# Patient Record
Sex: Female | Born: 1960 | Race: White | Hispanic: No | Smoking: Former smoker
Health system: Southern US, Community
[De-identification: ages and names within clinical notes are randomized; demographics above are authoritative.]

## PROBLEM LIST (undated history)

## (undated) DIAGNOSIS — I10 Essential (primary) hypertension: Secondary | ICD-10-CM

## (undated) DIAGNOSIS — F0390 Unspecified dementia without behavioral disturbance: Secondary | ICD-10-CM

## (undated) DIAGNOSIS — U071 COVID-19: Secondary | ICD-10-CM

## (undated) DIAGNOSIS — K869 Disease of pancreas, unspecified: Secondary | ICD-10-CM

## (undated) DIAGNOSIS — K56609 Unspecified intestinal obstruction, unspecified as to partial versus complete obstruction: Secondary | ICD-10-CM

## (undated) DIAGNOSIS — M5126 Other intervertebral disc displacement, lumbar region: Secondary | ICD-10-CM

## (undated) DIAGNOSIS — K2210 Ulcer of esophagus without bleeding: Secondary | ICD-10-CM

## (undated) DIAGNOSIS — K922 Gastrointestinal hemorrhage, unspecified: Secondary | ICD-10-CM

## (undated) DIAGNOSIS — G8929 Other chronic pain: Secondary | ICD-10-CM

## (undated) DIAGNOSIS — M549 Dorsalgia, unspecified: Secondary | ICD-10-CM

## (undated) HISTORY — PX: ABDOMINAL SURGERY: SHX537

## (undated) HISTORY — PX: BOWEL RESECTION: SHX1257

## (undated) HISTORY — PX: BACK SURGERY: SHX140

---

## 1998-12-19 HISTORY — PX: LAPAROSCOPIC NISSEN FUNDOPLICATION: SHX1932

## 1999-03-11 ENCOUNTER — Ambulatory Visit (HOSPITAL_COMMUNITY): Admission: RE | Admit: 1999-03-11 | Discharge: 1999-03-11 | Payer: Self-pay | Admitting: *Deleted

## 1999-04-19 ENCOUNTER — Ambulatory Visit (HOSPITAL_COMMUNITY): Admission: RE | Admit: 1999-04-19 | Discharge: 1999-04-19 | Payer: Self-pay | Admitting: *Deleted

## 1999-04-26 ENCOUNTER — Ambulatory Visit (HOSPITAL_COMMUNITY): Admission: RE | Admit: 1999-04-26 | Discharge: 1999-04-26 | Payer: Self-pay | Admitting: *Deleted

## 1999-06-16 ENCOUNTER — Encounter: Payer: Self-pay | Admitting: Surgery

## 1999-06-18 ENCOUNTER — Inpatient Hospital Stay (HOSPITAL_COMMUNITY): Admission: RE | Admit: 1999-06-18 | Discharge: 1999-06-21 | Payer: Self-pay | Admitting: Surgery

## 2000-04-14 ENCOUNTER — Encounter: Admission: RE | Admit: 2000-04-14 | Discharge: 2000-04-14 | Payer: Self-pay | Admitting: Family Medicine

## 2000-04-14 ENCOUNTER — Encounter: Payer: Self-pay | Admitting: Family Medicine

## 2000-09-14 ENCOUNTER — Encounter: Payer: Self-pay | Admitting: Family Medicine

## 2000-09-14 ENCOUNTER — Encounter: Admission: RE | Admit: 2000-09-14 | Discharge: 2000-09-14 | Payer: Self-pay | Admitting: Family Medicine

## 2001-01-10 ENCOUNTER — Encounter: Payer: Self-pay | Admitting: Family Medicine

## 2001-01-10 ENCOUNTER — Encounter: Admission: RE | Admit: 2001-01-10 | Discharge: 2001-01-10 | Payer: Self-pay | Admitting: Family Medicine

## 2001-01-11 ENCOUNTER — Encounter: Payer: Self-pay | Admitting: Family Medicine

## 2001-01-11 ENCOUNTER — Encounter: Admission: RE | Admit: 2001-01-11 | Discharge: 2001-01-11 | Payer: Self-pay | Admitting: Family Medicine

## 2001-02-14 ENCOUNTER — Encounter: Payer: Self-pay | Admitting: *Deleted

## 2001-02-14 ENCOUNTER — Encounter: Admission: RE | Admit: 2001-02-14 | Discharge: 2001-02-14 | Payer: Self-pay | Admitting: *Deleted

## 2001-03-02 ENCOUNTER — Ambulatory Visit (HOSPITAL_COMMUNITY): Admission: RE | Admit: 2001-03-02 | Discharge: 2001-03-02 | Payer: Self-pay | Admitting: *Deleted

## 2001-03-02 ENCOUNTER — Encounter (INDEPENDENT_AMBULATORY_CARE_PROVIDER_SITE_OTHER): Payer: Self-pay | Admitting: *Deleted

## 2001-12-19 HISTORY — PX: VENTRAL HERNIA REPAIR: SHX424

## 2001-12-19 HISTORY — PX: ABDOMINAL HYSTERECTOMY: SHX81

## 2002-01-29 ENCOUNTER — Encounter: Payer: Self-pay | Admitting: Family Medicine

## 2002-01-29 ENCOUNTER — Encounter: Admission: RE | Admit: 2002-01-29 | Discharge: 2002-01-29 | Payer: Self-pay | Admitting: Family Medicine

## 2002-03-26 ENCOUNTER — Inpatient Hospital Stay (HOSPITAL_COMMUNITY): Admission: RE | Admit: 2002-03-26 | Discharge: 2002-03-29 | Payer: Self-pay | Admitting: General Surgery

## 2002-05-10 ENCOUNTER — Encounter: Payer: Self-pay | Admitting: General Surgery

## 2002-05-10 ENCOUNTER — Ambulatory Visit (HOSPITAL_COMMUNITY): Admission: RE | Admit: 2002-05-10 | Discharge: 2002-05-10 | Payer: Self-pay | Admitting: General Surgery

## 2002-05-13 ENCOUNTER — Ambulatory Visit (HOSPITAL_COMMUNITY): Admission: RE | Admit: 2002-05-13 | Discharge: 2002-05-13 | Payer: Self-pay | Admitting: General Surgery

## 2002-05-15 ENCOUNTER — Ambulatory Visit (HOSPITAL_COMMUNITY): Admission: RE | Admit: 2002-05-15 | Discharge: 2002-05-15 | Payer: Self-pay | Admitting: General Surgery

## 2002-05-15 ENCOUNTER — Encounter: Payer: Self-pay | Admitting: General Surgery

## 2002-06-26 ENCOUNTER — Ambulatory Visit (HOSPITAL_COMMUNITY): Admission: RE | Admit: 2002-06-26 | Discharge: 2002-06-26 | Payer: Self-pay | Admitting: General Surgery

## 2002-06-26 ENCOUNTER — Encounter: Payer: Self-pay | Admitting: General Surgery

## 2002-09-03 ENCOUNTER — Encounter: Payer: Self-pay | Admitting: Family Medicine

## 2002-09-03 ENCOUNTER — Encounter: Admission: RE | Admit: 2002-09-03 | Discharge: 2002-09-03 | Payer: Self-pay | Admitting: Family Medicine

## 2002-11-27 ENCOUNTER — Encounter: Admission: RE | Admit: 2002-11-27 | Discharge: 2003-01-01 | Payer: Self-pay | Admitting: Sports Medicine

## 2002-12-02 ENCOUNTER — Encounter: Admission: RE | Admit: 2002-12-02 | Discharge: 2002-12-02 | Payer: Self-pay | Admitting: Sports Medicine

## 2003-01-05 ENCOUNTER — Encounter: Payer: Self-pay | Admitting: Emergency Medicine

## 2003-01-05 ENCOUNTER — Emergency Department (HOSPITAL_COMMUNITY): Admission: EM | Admit: 2003-01-05 | Discharge: 2003-01-05 | Payer: Self-pay | Admitting: Emergency Medicine

## 2003-01-24 ENCOUNTER — Encounter: Payer: Self-pay | Admitting: Emergency Medicine

## 2003-01-24 ENCOUNTER — Emergency Department (HOSPITAL_COMMUNITY): Admission: EM | Admit: 2003-01-24 | Discharge: 2003-01-24 | Payer: Self-pay | Admitting: Emergency Medicine

## 2003-02-04 ENCOUNTER — Encounter: Admission: RE | Admit: 2003-02-04 | Discharge: 2003-02-04 | Payer: Self-pay | Admitting: Surgery

## 2003-02-04 ENCOUNTER — Encounter: Payer: Self-pay | Admitting: Surgery

## 2003-03-03 ENCOUNTER — Encounter: Admission: RE | Admit: 2003-03-03 | Discharge: 2003-03-03 | Payer: Self-pay | Admitting: Sports Medicine

## 2003-03-17 ENCOUNTER — Encounter: Admission: RE | Admit: 2003-03-17 | Discharge: 2003-03-17 | Payer: Self-pay | Admitting: Sports Medicine

## 2004-11-29 ENCOUNTER — Ambulatory Visit (HOSPITAL_COMMUNITY): Admission: RE | Admit: 2004-11-29 | Discharge: 2004-11-29 | Payer: Self-pay | Admitting: Obstetrics & Gynecology

## 2005-01-03 ENCOUNTER — Encounter: Admission: RE | Admit: 2005-01-03 | Discharge: 2005-01-26 | Payer: Self-pay | Admitting: Orthopedic Surgery

## 2005-03-02 ENCOUNTER — Ambulatory Visit (HOSPITAL_COMMUNITY): Admission: RE | Admit: 2005-03-02 | Discharge: 2005-03-02 | Payer: Self-pay | Admitting: General Surgery

## 2005-06-14 ENCOUNTER — Encounter: Admission: RE | Admit: 2005-06-14 | Discharge: 2005-09-12 | Payer: Self-pay | Admitting: Orthopedic Surgery

## 2006-09-18 ENCOUNTER — Emergency Department (HOSPITAL_COMMUNITY): Admission: EM | Admit: 2006-09-18 | Discharge: 2006-09-18 | Payer: Self-pay | Admitting: Emergency Medicine

## 2006-12-19 HISTORY — PX: LAPAROSCOPIC INCISIONAL / UMBILICAL / VENTRAL HERNIA REPAIR: SUR789

## 2007-05-04 ENCOUNTER — Ambulatory Visit (HOSPITAL_COMMUNITY): Admission: RE | Admit: 2007-05-04 | Discharge: 2007-05-05 | Payer: Self-pay | Admitting: General Surgery

## 2007-11-14 ENCOUNTER — Ambulatory Visit: Payer: Self-pay | Admitting: Vascular Surgery

## 2008-02-05 ENCOUNTER — Emergency Department (HOSPITAL_COMMUNITY): Admission: EM | Admit: 2008-02-05 | Discharge: 2008-02-05 | Payer: Self-pay | Admitting: Emergency Medicine

## 2008-03-10 ENCOUNTER — Encounter: Admission: RE | Admit: 2008-03-10 | Discharge: 2008-03-10 | Payer: Self-pay | Admitting: Neurosurgery

## 2009-02-02 ENCOUNTER — Encounter: Admission: RE | Admit: 2009-02-02 | Discharge: 2009-02-02 | Payer: Self-pay | Admitting: Neurosurgery

## 2009-02-16 ENCOUNTER — Inpatient Hospital Stay (HOSPITAL_COMMUNITY): Admission: RE | Admit: 2009-02-16 | Discharge: 2009-02-18 | Payer: Self-pay | Admitting: Neurosurgery

## 2009-02-26 ENCOUNTER — Encounter: Admission: RE | Admit: 2009-02-26 | Discharge: 2009-02-26 | Payer: Self-pay | Admitting: Neurosurgery

## 2009-03-09 ENCOUNTER — Emergency Department (HOSPITAL_COMMUNITY): Admission: EM | Admit: 2009-03-09 | Discharge: 2009-03-09 | Payer: Self-pay | Admitting: Emergency Medicine

## 2009-03-12 ENCOUNTER — Encounter: Admission: RE | Admit: 2009-03-12 | Discharge: 2009-03-12 | Payer: Self-pay | Admitting: Neurosurgery

## 2009-05-27 ENCOUNTER — Encounter: Admission: RE | Admit: 2009-05-27 | Discharge: 2009-08-25 | Payer: Self-pay | Admitting: Neurosurgery

## 2009-11-24 ENCOUNTER — Encounter
Admission: RE | Admit: 2009-11-24 | Discharge: 2009-12-10 | Payer: Self-pay | Admitting: Physical Medicine and Rehabilitation

## 2009-11-25 ENCOUNTER — Ambulatory Visit: Payer: Self-pay | Admitting: Physical Medicine and Rehabilitation

## 2010-02-15 ENCOUNTER — Encounter (INDEPENDENT_AMBULATORY_CARE_PROVIDER_SITE_OTHER): Payer: Self-pay | Admitting: *Deleted

## 2010-03-11 ENCOUNTER — Ambulatory Visit: Payer: Self-pay | Admitting: Gastroenterology

## 2010-04-28 ENCOUNTER — Emergency Department (HOSPITAL_COMMUNITY): Admission: EM | Admit: 2010-04-28 | Discharge: 2010-04-28 | Payer: Self-pay | Admitting: Emergency Medicine

## 2010-10-20 ENCOUNTER — Emergency Department (HOSPITAL_COMMUNITY): Admission: EM | Admit: 2010-10-20 | Discharge: 2010-10-20 | Payer: Self-pay | Admitting: Emergency Medicine

## 2010-11-30 ENCOUNTER — Emergency Department (HOSPITAL_COMMUNITY)
Admission: EM | Admit: 2010-11-30 | Discharge: 2010-11-30 | Payer: Self-pay | Source: Home / Self Care | Admitting: Emergency Medicine

## 2011-01-08 ENCOUNTER — Encounter: Payer: Self-pay | Admitting: Obstetrics & Gynecology

## 2011-01-09 ENCOUNTER — Encounter: Payer: Self-pay | Admitting: Gastroenterology

## 2011-01-09 ENCOUNTER — Encounter: Payer: Self-pay | Admitting: Family Medicine

## 2011-01-09 ENCOUNTER — Encounter: Payer: Self-pay | Admitting: General Surgery

## 2011-01-20 NOTE — Letter (Signed)
Summary: New Patient letter  [facility]  [address] [place], [place] [address]   Phone: [phone]  Fax: [phone]       02/15/2010 MRN: [id number]  [facility] [address] [place], [place]  [address]  Dear Ms. Turney,  Welcome to the Gastroenterology Division at [facility].    You are scheduled to see Dr. Jarold Motto on 03-11-10 at 9:30a.m. on the 3rd floor at [facility], [address]  We ask that you try to arrive at our office 15 minutes prior to your appointment time to allow for check-in.  We would like you to complete the enclosed self-administered evaluation form prior to your visit and bring it with you on the day of your appointment.  We will review it with you.  Also, please bring a complete list of all your medications or, if you prefer, bring the medication bottles and we will list them.  Please bring your insurance card so that we may make a copy of it.  If your insurance requires a referral to see a specialist, please bring your referral form from your primary care physician.  Co-payments are due at the time of your visit and may be paid by cash, check or credit card.     Your office visit will consist of a consult with your physician (includes a physical exam), any laboratory testing he/she may order, scheduling of any necessary diagnostic testing (e.g. x-ray, ultrasound, CT-scan), and scheduling of a procedure (e.g. Endoscopy, Colonoscopy) if required.  Please allow enough time on your schedule to allow for any/all of these possibilities.    If you cannot keep your appointment, please call [phone] to cancel or reschedule prior to your appointment date.  This allows [place] the opportunity to schedule an appointment for another patient in need of care.  If you do not cancel or reschedule by 5 p.m. the business day prior to your appointment date, you will be charged a $50.00 late cancellation/no-show fee.    Thank you for choosing  [facility] for your medical needs.  We appreciate the opportunity to care for you.  Please visit [place] at our website  to learn more about our practice.                     Sincerely,                                                             The Gastroenterology Division

## 2011-01-20 NOTE — Procedures (Signed)
Summary: EGD   EGD  Procedure date:  03/02/2001  Findings:      Location: [facility]   Patient Name: Heather, [name] MRN:  Procedure Procedures: Panendoscopy (EGD) CPT: 43235.    with biopsy(s)/brushing(s). CPT: D1846139.  Personnel: Endoscopist: Roosvelt Harps, MD.  Referred By: Benedetto Goad, MD.  Exam Location: Exam performed in Endoscopy Suite. Outpatient  Patient Consent: Procedure, Alternatives, Risks and Benefits discussed, consent obtained, from patient. Consent to be contacted was not given.  Indications  Evaluation of: Positive fecal occult blood test per digital rectal exam.  Symptoms: Abdominal pain, location: RUQ. Reflux symptoms  Surveillance of: Barrett's Esophagus.  History Allergies: No known allergies.  Pre-Exam Physical: Cardio-pulmonary exam, HEENT exam WNL. Abdominal exam abnormal. Extremity exam, Neurological exam, Mental status exam WNL. Abnormal PE findings include: Minimal epigastric and RUQ tenderness, rectal guaiac neg.  Exam Exam Info: Maximum depth of insertion Duodenum, intended Duodenum. Patient position: on left side. Vocal cords not visualized. Gastric retroflexion performed. Images taken. ASA Classification: I. Tolerance: excellent.  Sedation Meds: Patient assessed and found to be appropriate for moderate (conscious) sedation. Sedation was managed by the Endoscopist. Cetacaine Spray 2 sprays Demerol 100 mg. Versed 10 mg.  Monitoring: BP and pulse monitoring done. Oximetry used. Supplemental O2 given  Fluoroscopy: Fluoroscopy was not used.  Findings DIAGNOSTIC TEST: Biopsy taken. from Distal Esophagus Reason: Hx Barrett's; minimal evidence at present. Comments: Image # 1.  IMAGE TAKEN: in Cardia. Image #4 attached. Comments: S?P fundoplication, wrap appears intact.  IMAGE TAKEN: in Duodenal Bulb. Image #2 attached. Comments: Normal.  IMAGE TAKEN: in Duodenal 2nd Portion. Image #3 attached. Comments: Normal.     Comments: Essentially normal post fundoplication Assessment Abnormal examination, see findings above.  Events  Unplanned Intervention: No unplanned interventions were required.  Unplanned Events: There were no complications. Plans Medication(s): Continue current medications. Other: Robinul   Patient Education: Patient given standard instructions for: Reflux.  Disposition: After procedure patient sent to recovery. After recovery patient sent home.  Scheduling: Follow-up prn.    cc: Benedetto Goad, MD   This report was created from the original endoscopy report, which was reviewed and signed by the above listed endoscopist.

## 2011-02-28 LAB — CBC
HCT: 38.8 % (ref 36.0–46.0)
MCH: 30.8 pg (ref 26.0–34.0)
MCV: 92.6 fL (ref 78.0–100.0)
Platelets: 229 10*3/uL (ref 150–400)
RBC: 4.19 MIL/uL (ref 3.87–5.11)
RDW: 12.6 % (ref 11.5–15.5)

## 2011-02-28 LAB — POCT I-STAT, CHEM 8
BUN: 7 mg/dL (ref 6–23)
Calcium, Ion: 1.15 mmol/L (ref 1.12–1.32)
Glucose, Bld: 126 mg/dL — ABNORMAL HIGH (ref 70–99)
TCO2: 27 mmol/L (ref 0–100)

## 2011-03-01 LAB — COMPREHENSIVE METABOLIC PANEL
Albumin: 3.7 g/dL (ref 3.5–5.2)
Alkaline Phosphatase: 53 U/L (ref 39–117)
BUN: 8 mg/dL (ref 6–23)
Calcium: 9.1 mg/dL (ref 8.4–10.5)
Creatinine, Ser: 0.69 mg/dL (ref 0.4–1.2)
Glucose, Bld: 85 mg/dL (ref 70–99)
Potassium: 4 mEq/L (ref 3.5–5.1)
Total Protein: 5.9 g/dL — ABNORMAL LOW (ref 6.0–8.3)

## 2011-03-01 LAB — URINALYSIS, ROUTINE W REFLEX MICROSCOPIC
Glucose, UA: NEGATIVE mg/dL
Hgb urine dipstick: NEGATIVE
Protein, ur: NEGATIVE mg/dL
Specific Gravity, Urine: 1.012 (ref 1.005–1.030)

## 2011-03-01 LAB — CBC
MCHC: 32.6 g/dL (ref 30.0–36.0)
MCV: 94.3 fL (ref 78.0–100.0)
Platelets: 282 10*3/uL (ref 150–400)
RDW: 13 % (ref 11.5–15.5)
WBC: 8 10*3/uL (ref 4.0–10.5)

## 2011-03-01 LAB — DIFFERENTIAL
Lymphocytes Relative: 40 % (ref 12–46)
Lymphs Abs: 3.2 10*3/uL (ref 0.7–4.0)
Monocytes Absolute: 0.4 10*3/uL (ref 0.1–1.0)
Monocytes Relative: 6 % (ref 3–12)
Neutro Abs: 4.1 10*3/uL (ref 1.7–7.7)
Neutrophils Relative %: 51 % (ref 43–77)

## 2011-03-08 LAB — CBC
HCT: 42.6 % (ref 36.0–46.0)
Hemoglobin: 14.8 g/dL (ref 12.0–15.0)
MCHC: 34.7 g/dL (ref 30.0–36.0)
MCV: 96.5 fL (ref 78.0–100.0)
Platelets: 252 10*3/uL (ref 150–400)
RBC: 4.41 MIL/uL (ref 3.87–5.11)
RDW: 12.5 % (ref 11.5–15.5)
WBC: 13.2 10*3/uL — ABNORMAL HIGH (ref 4.0–10.5)

## 2011-03-08 LAB — COMPREHENSIVE METABOLIC PANEL
ALT: 23 U/L (ref 0–35)
AST: 19 U/L (ref 0–37)
Albumin: 4 g/dL (ref 3.5–5.2)
Alkaline Phosphatase: 62 U/L (ref 39–117)
BUN: 15 mg/dL (ref 6–23)
CO2: 25 mEq/L (ref 19–32)
Calcium: 9.3 mg/dL (ref 8.4–10.5)
Chloride: 107 mEq/L (ref 96–112)
Creatinine, Ser: 0.64 mg/dL (ref 0.4–1.2)
GFR calc Af Amer: >60 mL/min (ref >60)
GFR calc non Af Amer: >60 mL/min (ref >60)
Glucose, Bld: 119 mg/dL — ABNORMAL HIGH (ref 70–99)
Potassium: 3.9 mEq/L (ref 3.5–5.1)
Sodium: 138 mEq/L (ref 135–145)
Total Bilirubin: 0.5 mg/dL (ref 0.3–1.2)
Total Protein: 6.8 g/dL (ref 6.0–8.3)

## 2011-03-08 LAB — DIFFERENTIAL
Basophils Absolute: 0 10*3/uL (ref 0.0–0.1)
Basophils Relative: 0 % (ref 0–1)
Eosinophils Absolute: 0 10*3/uL (ref 0.0–0.7)
Eosinophils Relative: 0 % (ref 0–5)
Lymphocytes Relative: 12 % (ref 12–46)
Lymphs Abs: 1.5 10*3/uL (ref 0.7–4.0)
Monocytes Absolute: 0.3 10*3/uL (ref 0.1–1.0)
Monocytes Relative: 3 % (ref 3–12)
Neutro Abs: 11.3 10*3/uL — ABNORMAL HIGH (ref 1.7–7.7)
Neutrophils Relative %: 86 % — ABNORMAL HIGH (ref 43–77)

## 2011-03-08 LAB — CK TOTAL AND CKMB (NOT AT ARMC): Relative Index: INVALID (ref 0.0–2.5)

## 2011-03-31 LAB — DIFFERENTIAL
Basophils Absolute: 0.1 10*3/uL (ref 0.0–0.1)
Basophils Relative: 1 % (ref 0–1)
Eosinophils Absolute: 0.1 10*3/uL (ref 0.0–0.7)
Eosinophils Relative: 2 % (ref 0–5)
Lymphocytes Relative: 35 % (ref 12–46)
Monocytes Absolute: 0.3 10*3/uL (ref 0.1–1.0)

## 2011-03-31 LAB — URINALYSIS, ROUTINE W REFLEX MICROSCOPIC
Bilirubin Urine: NEGATIVE
Ketones, ur: NEGATIVE mg/dL
Nitrite: NEGATIVE
Protein, ur: NEGATIVE mg/dL
pH: 5 (ref 5.0–8.0)

## 2011-03-31 LAB — BASIC METABOLIC PANEL
BUN: 12 mg/dL (ref 6–23)
CO2: 27 mEq/L (ref 19–32)
Chloride: 103 mEq/L (ref 96–112)
Glucose, Bld: 96 mg/dL (ref 70–99)
Potassium: 4.1 mEq/L (ref 3.5–5.1)
Sodium: 139 mEq/L (ref 135–145)

## 2011-03-31 LAB — CBC
HCT: 39.7 % (ref 36.0–46.0)
Hemoglobin: 13.5 g/dL (ref 12.0–15.0)
MCHC: 34 g/dL (ref 30.0–36.0)
MCV: 93.4 fL (ref 78.0–100.0)
Platelets: 390 10*3/uL (ref 150–400)
RDW: 14.2 % (ref 11.5–15.5)

## 2011-03-31 LAB — URINE MICROSCOPIC-ADD ON

## 2011-04-05 LAB — CBC
HCT: 43.5 % (ref 36.0–46.0)
Hemoglobin: 15.3 g/dL — ABNORMAL HIGH (ref 12.0–15.0)
MCV: 92.7 fL (ref 78.0–100.0)
RDW: 13.3 % (ref 11.5–15.5)
WBC: 6.9 10*3/uL (ref 4.0–10.5)

## 2011-04-05 LAB — ABO/RH: ABO/RH(D): B POS

## 2011-04-05 LAB — DIFFERENTIAL
Eosinophils Relative: 3 % (ref 0–5)
Lymphocytes Relative: 40 % (ref 12–46)
Lymphs Abs: 2.8 10*3/uL (ref 0.7–4.0)
Monocytes Absolute: 0.5 10*3/uL (ref 0.1–1.0)

## 2011-05-03 NOTE — Op Note (Signed)
Heather [name], Heather [name]              ACCOUNT NO.:  [id number]   MEDICAL RECORD NO.:  [id number]          PATIENT TYPE:  INP   LOCATION:  2899                         FACILITY:  [facility]   PHYSICIAN:  Kathaleen Maser. Pool, M.D.    DATE OF BIRTH:  1961-01-24   DATE OF PROCEDURE:  02/16/2009  DATE OF DISCHARGE:                               OPERATIVE REPORT   PREOPERATIVE DIAGNOSIS:  L5-S1 degenerative disk disease with foraminal  stenosis.   POSTOPERATIVE DIAGNOSIS:  L5-S1 degenerative disk disease with foraminal  stenosis.   PROCEDURE NAME:  L5-S1 decompressive laminectomy with bilateral L5-S1  decompressive foraminotomies, more than we would be required for simple  interbody fusion alone.  L5-S1 posterior lumbar interbody fusion  utilizing tangent interbody allograft wedge, Telamon interbody PEEK  cage, and local autografting.  L5-S1 posterolateral arthrodesis  utilizing non-segmental pedicle screw fixation and local autografting.   SURGEON:  Kathaleen Maser. Pool, MD   ASSISTANT:  Reinaldo Meeker, MD   ANESTHESIA:  General endotracheal.   INDICATIONS:  Heather [name] is a 50 year old female with history of  severe back and lower extremity pain, failing conservative management.  Workup demonstrates evidence of marked disk degeneration with disk space  collapse and retrolisthesis and significant foraminal stenosis at L5-S1.  The patient is counseled as to her options.  She decided to proceed with  an L5-S1 decompression and fusion in the hopes of improving her  symptoms.   OPERATIVE NOTE:  The patient was brought to the operating room and  placed on the table in the supine position.  After adequate level of  anesthesia was achieved, the patient was placed prone onto Wilson frame,  appropriately padded the patient's lumbar regions and prepped and draped  sterilely.  A 10 blade was used to make a curvilinear skin incision  overlying the L5-S1 interspace.  This was carried down sharply in the  midline.  A subperiosteal dissection was then performed exposing the  lamina and facet joints of L5-S1 as well as the transverse processes of  L5 and sacral ala bilaterally.  Deep self-retaining retractors were  placed.  Intraoperative fluoroscopy was used and levels were confirmed.  Decompressive laminectomy was then performed using Leksell rongeurs,  Kerrison rongeurs, and high-speed drill to completely remove the lamina  of L5.  I performed inferior facetectomies of L5 bilaterally, superior  facetectomies of S1 bilaterally, and superior laminectomy of S1  bilaterally.  All bone was cleaned and used in later autografting.  Ligament flavum and epidural scar were then elevated and resected in the  piecemeal fashion using Kerrison rongeurs.  Underlying thecal sac and  exiting L5 and S1 nerve roots were identified.  Decompressive  foraminotomies were then performed along the course of exiting L5 and S1  nerve roots bilaterally.  Epidural venous plexus was coagulated and cut.  Bilateral diskectomies were then performed at L5-S1.  Disk space was  then sequentially dilated up to 8 mm, then an 8-mm distractor left on  the patient's right side.  Thecal sac and nerve root were inspected on  the left side.  Disk space was then  reamed and then cut with 8-mm  tangent instrument.  Soft tissues were then removed from the interspace.  An 8 x 22 mm Telamon cage packed with morselized autograft, then packed  into place, and recessed approximately 3 mm from the posterior cortical  margin of L5.  Distractors were removed from the patient's right side.  Thecal sac and nerve roots were inspected on the right side.  Disk space  once again reamed and then cut with 8-mm tangent instrument.  Soft  tissue was once again removed from the interspace.  Disk space was  further curettaged.  Morselized autograft was then packed in the  interspace.  An 8  x 22 mm tangent wedge was then packed into place and  recessed  approximately 2 mm from posterior cortical margin of L5.  Pedicles of L5 and S1 were then identified using surface landmarks and  intraoperative fluoroscopy.  Superficial bone overlying the pedicle was  then removed using high-speed drill.  Each pedicle was then probed using  pedicle awl.  Pedicle awl track was then tapped with 5.2-mm screw tap  hole.  Each screw tap hole was then probed and found to be solid with  the bone.  A 5.75 x 35 mm radius screw was placed bilaterally at L5.  A  6.75 x 35 mm screw was placed bilaterally at S1.  Transverse processes  and sacral ala were then decorticated with high-speed drill.  Morselized  autograft was packed posterolaterally for later fusion.  Short segment  titanium rod was then placed over the screw heads.  Locking caps were  then placed on the screw heads.  Locking caps were then engaged with  construct under compression.  Final images revealed good position of the  bone graft, hardware at operative level, and normal spine.  Wound was  then irrigated with antibiotic solution.  Hemostasis was ensured with  bipolar electrocautery.  A medium Hemovac drain was left in place.  Wounds were closed in layers with Vicryl suture.  Steri-Strips and  sterile dressing were applied.  There were no complications.  The  patient tolerated the procedure well and she returned to recovery room  postoperatively.           ______________________________  Kathaleen Maser Pool, M.D.     HAP/MEDQ  D:  02/16/2009  T:  02/16/2009  Job:  [id number]

## 2011-05-03 NOTE — Consult Note (Signed)
NEW PATIENT CONSULTATION   [name], [name]  DOB:  26-Jun-1961                                       11/14/2007  AOZHY#:[id number]   The patient presents today for evaluation of bilateral leg pain.  She is  a 50 year old who reports progressively severe pain she reports is  nearly constant.  This begins at the level of her knees and extends in  the pretibial area down into her feet.  She does not have any  significant swelling, does not have any history of prior deep venous  thrombosis or superficial thrombophlebitis.  She does report that in the  past she had been diagnosed with sciatic nerve discomfort, but this is  not related to this, and this is a much more severe constant pain.  She  does report some relief with a heating pad or with soaking in a hot tub.  She reports this occurs when first awakening and continues throughout  the day.   PAST MEDICAL HISTORY:  1. Right lung surgery as an infant.  2. She is status post C-section in the 1980s.  3. Laparoscopic Nissen fundoplication in 2000.  4. Hysterectomy in 2003.  5. Ventral hernia repair with mesh in 2008.   FAMILY HISTORY:  Significant for premature atherosclerotic disease in  her father and sisters and brothers.  Her mother was a diabetic.   SOCIAL HISTORY:  She is single, with two children.  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  She does smoke 1/2 pack of cigarettes  per day, and does not drink any alcohol.   REVIEW OF SYSTEMS:  Positive for shortness of breath with exertion,  arthritic joint pain, history of asthma.  She denies any cardiac  difficulties.   ALLERGIES:  PENICILLIN.   MEDICATIONS:  Tramadol, aspirin, and vitamins.   PHYSICAL EXAMINATION:  She is a well-developed, well-nourished white  female, appearing stated age of 54.  Blood pressure is 147/91, pulse 95,  respirations 18, O2 saturations are 100% on room air.  Her lower  extremities are noted for 2+ dorsalis pedis  pulses bilaterally.  She  does not have any significant swelling.  She does have scattered  spiderman telangiectasia, but no evidence of skin changes from venous  hypertension.  No evidence of reticular or venous varicosities.   She underwent handheld venous duplex by me showing normal-caliber  saphenous veins and surface veins bilaterally, with no evidence of  reflux.  I discussed this with the patient.  I explained that I  understand her frustration that she has continued to have severe  limiting lower extremity discomfort with no apparent obvious etiology to  this.  I explained that with totally normal pulses and no physical  findings or duplex suggesting venous hypertension, I would not recommend  any further followup or evaluation.  I feel comfortable that her pain is  not related to a vascular pathology.  She understands and will continue  her followup with Dr. Lysbeth Galas for continued evaluation.   Larina Earthly, M.D.  Electronically Signed   TFE/MEDQ  D:  11/14/2007  T:  11/19/2007  Job:  [id number]   cc:   Delaney Meigs, M.D.

## 2011-05-03 NOTE — Op Note (Signed)
[name], [name]              ACCOUNT NO.:  [id number]   MEDICAL RECORD NO.:  [id number]          PATIENT TYPE:  OIB   LOCATION:  2550                         FACILITY:  [facility]   PHYSICIAN:  Ollen Gross. Vernell Morgans, M.D. DATE OF BIRTH:  November 18, 1961   DATE OF PROCEDURE:  05/04/2007  DATE OF DISCHARGE:                               OPERATIVE REPORT   PREOPERATIVE DIAGNOSIS:  Ventral incisional hernia.   POSTOPERATIVE DIAGNOSIS:  Ventral incisional hernia.   PROCEDURE:  Laparoscopic ventral incisional hernia repair with mesh.   SURGEON:  Ollen Gross. Vernell Morgans, M.D.   ASSISTANT:  Sharlet Salina T. Hoxworth, M.D.   ANESTHESIA:  General endotracheal.   DESCRIPTION OF PROCEDURE:  After informed consent was obtained, the  patient was brought to the operating room and placed in the supine  position on the operating table.  After adequate induction of general  anesthesia, the patient's abdomen was prepped with Betadine and draped  in the usual sterile manner.  An area was chosen on the left lateral  side of the abdomen for placement of the Hasson cannula.  This area was  infiltrated with 0.25% Marcaine.  Incision was made with a 15 blade  knife.  This incision was carried down through to the subcutaneous  tissue bluntly with a Kelly clamp and Army-Navy retractors until the  fascia of the abdominal wall was encountered.  Each fascial layer of the  abdominal wall was divided sharply with Metzenbaum scissors and grasped  with a Kocher clamp and elevated anteriorly until we were able to open  the peritoneum with the Metzenbaum scissors and gain access to the  abdominal cavity.  The abdominal cavity appeared to be free.  A 0 Vicryl  pursestring stitch was placed in the fascia around this opening.  Hasson  cannula was placed through the opening and again placed a 0 Vicryl  pursestring stitch.  The abdomen was then insufflated with carbon  dioxide without difficulty.  Laparoscope was inserted through the  Hasson  cannula, and the abdomen was explored.  There were some adhesions to the  midline abdominal wound, but these were not extensive.  Below the Hasson  cannula, a site was chosen for placement of a 5-mm port.  This area was  infiltrated with 0.25% Marcaine.  A small stab incision was made with a  15-blade knife, and a 5 mm port was placed bluntly through this incision  into the abdominal cavity under direct vision.  A harmonic scalpel was  then placed through this 5 mm port and used to take down the omental  adhesions to the abdominal wall.  A small ventral hernia was identified  at the upper end of these adhesions.  The size of the hernia defect was  estimated using a spinal needle under direct vision and 3 cm were  allowed circumferentially around the defect for overlap of the mesh.  The mesh size chosen was 10 x 10.  The mesh was then oriented with  letters which corresponded to letters on the drapes on the abdominal  wall.  Four #1 Novofils were then placed at  equal distances around the  edge of this piece of mesh.  The mesh was oriented with the blue stitch  guide towards the ceiling.  Once the Novofils were in place, the mesh  was rolled and placed through the Hasson cannula into the abdominal  cavity.  Once in the abdominal cavity the mesh was unrolled with a blunt  grasper and reoriented.   Four small stab incisions were made around the hernia defect  corresponding to the placement of the four Novofil stitches and a suture  passer was then used to grasp the appropriate tails of each stitch and  bring them through the abdominal wall.  Once this was accomplished, all  four stitches were pulled up and the mesh was anchored against the  abdominal wall very nicely with good apposition and no wrinkles.  Each  of these four stitches was then tied down.  The areas in between the  stitches were then closed in apposition to the abdominal wall using  tacks from a Pro-tacking device.   This was done circumferentially in two  rows around the edge of the mesh.  Once this was accomplished, the mesh  was in very good position.  Everything was hemostatic.  It looked very  good.  The abdomen was then inspected again and no other abnormalities  were noted.  The 5 mm ports were removed, and the gas was allowed to  escape.  The mesh was observed as the gas was allowed to escape and  continued to have good apposition to the abdominal wall.  The Hasson  cannula was then removed, and the fascial defect was closed with 3-0  Vicryl pursestring stitch.  The skin incisions were then all closed with  interrupted 4-0 Monocryl subcuticular stitch, and Dermabond was applied.  Fluff dressing was also applied to the abdomen.  The patient tolerated  the procedure well.  At the end of the case all needle, sponge and  instrument counts were correct.  The patient was then awakened and taken  to the recovery room in stable condition.      Ollen Gross. Vernell Morgans, M.D.  Electronically Signed     PST/MEDQ  D:  05/04/2007  T:  05/05/2007  Job:  [id number]

## 2011-05-06 NOTE — H&P (Signed)
[facility]  Patient:    Heather [name], Heather [name] Visit Number: [id number] MRN: [id number]          Service Type: OUT Location: RAD Attending Physician:  Dalia Heading Dictated by:   Franky Macho, M.D. Admit Date:  05/10/2002 Discharge Date: 05/10/2002   CC:         Duane Lope, M.D.   History and Physical  DATE OF BIRTH:  [birth year]  CHIEF COMPLAINT:  Nausea and epigastric pain.  HISTORY OF PRESENT ILLNESS:  The patient is a 50 year old white female who is referred for evaluation and treatment of nausea.  She has been having nausea over the past few weeks after undergoing pelvic surgery.  She states the nausea comes and goes.  It is associated with epigastric and right upper quadrant abdominal pain with radiation to the right flank.  No fever.  No chills or jaundice have been noted.  There is no history of peptic ulcer disease.  Prevacid has not been too helpful.  There is also a question of abdominal bloating.  No vomiting has been noted.  The patient is status post a laparoscopic Nissen fundoplication four years ago by another Careers adviser.  PAST MEDICAL HISTORY: 1. Malrotation of the intestines. 2. Anxiety.  PAST SURGICAL HISTORY:  As noted above, multiple surgeries for bowel malrotation in the past.  CURRENT MEDICATIONS:  Xanax, Cenestin, Prevacid, Tylenol.  ALLERGIES:  PENICILLIN.  REVIEW OF SYSTEMS:  Unremarkable.  PHYSICAL EXAMINATION:  GENERAL:  Well-developed, well-nourished white female in no acute distress.  VITAL SIGNS:  Afebrile, vital signs stable.  HEENT:  No scleral icterus.  LUNGS:  Clear to auscultation, with equal breath sounds bilaterally.  HEART:  Regular rate and rhythm without S3, S4, or murmurs.  ABDOMEN:  Soft, with tenderness noted in the right upper quadrant and epigastric regions.  No masses, rigidity, hepatosplenomegaly, or hernias are noted.  IMPRESSION:  Nausea of unknown etiology.  PLAN:  The patient  is scheduled for an EGD on May 15, 2002.  The risks and benefits of the procedure including bleeding and perforation were fully explained to the patient, who gave informed consent. Dictated by:   Franky Macho, M.D. Attending Physician:  Dalia Heading DD:  05/09/02 TD:  05/10/02 Job: [id number]

## 2011-05-06 NOTE — H&P (Signed)
[facility]  Patient:    MKAYLA, STEELE Visit Number: [id number] MRN: [id number]          Service Type: Attending:  Duane Lope, M.D. Dictated by:   Duane Lope, M.D.                           History and Physical  DATE OF BIRTH:   June 02, 1951  HISTORY OF PRESENT ILLNESS:  Anastashia is a 50 year old white female, gravida 6, para, abortus 4, with last menstrual period February 25, 2002, status post a tubal ligation which she had in 1989 with her last C section.  She presented to me complaining of heavy bleeding, bad cramps, and bad left lower quadrant pain.  She was actually seen by the physicians at [facility] earlier in March.  She had visited the ER because of pain and bleeding which started on February 27, 2002.  In the past, she has undergone removal of the right ovary and fallopian tube in 1997 for pain and had a myomectomy at that time because of fibroids.  Her bleeding recently has been incredibly heavy. They have been irregular and heavy for the past year.  Her hemoglobin is down to 10.7, and she feels weak and run down.  Previously, she had a CT scan of the abdomen and pelvis and ultrasound which showed cystic lesions of the left ovary, status post right oophorectomy, and some posterior anterior and fundal fibroid changes.  On exam in the office, she as quite tender to palpation both in the midline and on the left side.  Repeat sonogram basically was no different from what had been seen previously.  Of note, she also has malrotation of the colon and underwent surgery in 1976 and again in 1977 for adhesions.  The patient has been out of work since her pain started on February 27, 2002, and has required Percocet for her pain ongoing since that time.  As a result, she was admitted for abdominal hysterectomy and left salpingo-oophorectomy.  Dr. Lovell Sheehan will be assisting in surgery with me because of the possibility of adhesions  and malrotation.  She has been mechanically bowel prepped preoperatively.  PAST MEDICAL HISTORY:  Colon rotation.  PAST SURGICAL HISTORY: 1. Lung surgery. 2. In 1976, malrotation of the colon.  She had an appendectomy at that time. 3. In 1977, she had adhesiolysis surgery. 4. In 1984 and 1989, she had C sections. 5. In 1997, she had removal of right ovary, fallopian tube, and myomectomy. 6. In 1999, she had a Nissen fundoplication done laparoscopically.  PAST OBSTETRICAL HISTORY:  Two cesarean sections and four losses.  ALLERGIES:  CODEINE and PENICILLIN.  MEDICATIONS:  Percocet as needed 5/325 and Xanax.  PHYSICAL EXAMINATION:  VITAL SIGNS:  Height 5 feet 7 inches, weight 130 pounds.  Blood pressure 130/70, hemoglobin 6.7.  HEENT:  Unremarkable.  NECK:  Normal thyroid.  LUNGS:  Clear.  HEART:  Regular rate and rhythm without murmur, regurgitation, or gallop.  BREASTS:  Without mass, discharge, skin changes.  No axillary adenopathy and no nipple changes.  ABDOMEN:  Tender in the left lower quadrant and in the midline.  No rebound and no involuntary guarding.  The left adnexa is benign.  PELVIC:  She has normal external genitalia.  Vagina is without discharge.  She does have a tender uterus on exam, and left adnexa is full and tender.  EXTREMITIES:  Warm with no edema.  NEUROLOGIC:  Grossly intact.  IMPRESSION: 1. Severe left lower quadrant pain with cystic left ovarian lesion. 2. Dysmenorrhea. 3. Menometrorrhagia. 4. Anemia. 5. Malrotation of the colon.  PLAN:  The patient is admitted for abdominal hysterectomy and left salpingo-oophorectomy.  She understands the risks of surgery including injury to bowel.  She has been mechanically bowel prepped and will receive antibiotics preoperatively.  Dr. Lovell Sheehan will be assisting, and she is aware of that because of her significant bowel history.  She understands the risks, benefits, indications, alternatives and will  proceed. Dictated by:   Duane Lope, M.D. Attending:  Duane Lope, M.D. DD:  03/25/02 TD:  03/25/02 Job: [id number]

## 2011-05-06 NOTE — Op Note (Signed)
[facility]  Patient:    Heather [name], Heather [name] Visit Number: [id number] MRN: [id number]          Service Type: Attending:  Franky Macho, M.D. Dictated by:   Franky Macho, M.D. Proc. Date: 03/26/02   CC:         Duane Lope, M.D.   Operative Report  AGE:  The patient is 50 years old.  PREOPERATIVE DIAGNOSIS:  Incisional hernia.  POSTOPERATIVE DIAGNOSIS:  Incisional hernia.  PROCEDURE: Incisional herniorrhaphy.  SURGEON:  Franky Macho, M.D.  ASSISTANT:  Duane Lope, M.D.  ANESTHESIA:  General endotracheal.  INDICATIONS:  The patient is a 50 year old white female who I was assisting on a total abdominal hysterectomy and left salpingo-oophorectomy, who was noted to have a supraumbilical incisional hernia from a previous procedure.  It extended approximately 3 cm in length longitudinally.  It was elected to repair this hernia at the close of the procedure.  PROCEDURE NOTE:  The patient was already under general endotracheal anesthesia.  The pelvic surgery was completed.  The incisional hernia was closed using #1 Novofil interrupted sutures along the underside of the midline fascia.  The defect was closed without difficulty.  The lower midline incision was then closed and the case completed by Dr. ______ .  COMPLICATIONS:  None.  SPECIMENS:  None.  BLOOD LOSS:  Minimal. Dictated by:   Franky Macho, M.D. Attending:  Franky Macho, M.D. DD:  03/26/02 TD:  03/26/02 Job: [id number]

## 2011-05-06 NOTE — Op Note (Signed)
[facility]  Patient:    Heather [name], Heather [name] Visit Number: [id number] MRN: [id number]          Service Type: MED Location: 4A A428 [id number] Attending Physician:  Lazaro Arms Dictated by:   Duane Lope, M.D. Proc. Date: 03/26/02 Admit Date:  03/26/2002                             Operative Report  PREOPERATIVE DIAGNOSES: 1. Menometrorrhagia. 2. Anemia. 3. Dysmenorrhea. 4. Left lower quadrant pain. 5. Malrotation of the colon.  POSTOPERATIVE DIAGNOSES: 1. Menometrorrhagia. 2. Anemia. 3. Dysmenorrhea. 4. Left lower quadrant pain. 5. Malrotation of the colon. 6. Incision hernia from Nissen fundoplication.  PROCEDURES: 1. Abdominal hysterectomy with left salpingo-oophorectomy. 2. Incisional hernia repair preformed by Dr. Franky Macho.  SURGEON:  Duane Lope, M.D.  ASSISTANT:  Franky Macho, M.D.  Please note that Dr. Lovell Sheehan was my assistant for the TAH/LSO, but he primarily did the closure of the incisional hernia.  ESTIMATED BLOOD LOSS:  100 cc.  ANESTHESIA:  General endotracheal.  FINDINGS:  The patient really had no adhesive disease whatsoever.  She had no omentum present really, very thin, papery, but she had no adhesive disease. The uterus appeared to be normal and there was no left ovarian cyst found. She did, however, have on examination an incisional hernia from her Nissen fundoplication done in 2000.  Her gallbladder was normal.  There was no inguinal hernia appreciated on examination.  The rest of the intraperitoneal and intra-abdominal contents were normal.  DESCRIPTION OF OPERATION:  The patient received Cefotan prophylactically in the preoperative area.  She had Flowtrons placed, and she was mechanically bowel prepped prior to surgery. The patient was taken to the operating room where she was placed in the supine position, and underwent general endotracheal anesthesia.  She was then prepped and draped in the usual  sterile fashion.  A Foley catheter was placed.  She had a previous vertical incision and this was used.  An incision was made in the midline, carried down sharply through the rectus fascia which was scored in the midline and extended laterally.  The fascia was divided.  The rectus muscles were divided, and the peritoneal cavity was entered sharply under direct visualization.  The incisional hernia was appreciated at this time.  The patient was placed in the Trendelenburg position.  A Bookwalter self-retaining retractor was placed, and the upper abdomen was packed away using the Bookwalter and moist lap pads. The uterine cornu were grasped using Kocher clamps.  The left round ligament was isolated, suture ligated, and cut, and the vesicouterine serosal flap on the left was created.  The infundibulopelvic ligament on the left was clamped, cut, and double suture ligated with good hemostasis.  The right round ligament was isolated, suture ligated, and cut.  The utero-ovarian ligament and infundibulopelvic ligament were cross-clamped, cut, and suture ligated with good hemostasis.  The vesicouterine serosal flap was created on the right and joined in the left, and the bladder was pushed off the lower uterine segment without difficulty. The uterine vessels were skeletonized bilaterally.  They were clamped, cut, and suture ligated with good hemostasis.  The pedicles were taken down on either side of the cervix through the cardinal ligament, each pedicle being clamped, cut, transfixed, suture ligated, and cut.  The end of the cervix was encountered.  The vagina was cross-clamped and specimen was removed.  Vaginal angle sutures were placed  and then interrupted sutures were placed in the vagina for full closure.  Vigorous pelvic irrigation was performed.  Hemostasis was confirmed.  Dr. Lovell Sheehan then performed the repair of the incisional hernia, and please see his note for details of that.  The  Bookwalter self-retaining retractor had been removed and the upper abdomen packs were removed as well.  Again, there was good hemostasis of all pedicles.  The midline incision was closed using a modified Smead-Jones method, using an #0 PDS looped and a subcutaneous fat muscle, fat muscle, fascial muscle, and peritoneum were closed in a far-far/near-near fashion.  There was good anatomical repair.  The subcutaneous tissue was irrigated and made hemostatic.  The skin was closed using skin staples.  An Alpha-200 pump was placed using 0.25% Marcaine plain, and in addition, 0.5% Marcaine plain was used to inject in the incision at the time of closure for postoperative pain control. The patient was awakened from anesthesia, taken to the recovery room in good and stable condition.  All instrument, sponge, and needle counts were correct. She experienced 100 cc of blood loss intraoperatively. Dictated by:   Duane Lope, M.D. Attending Physician:  Lazaro Arms DD:  03/26/02 TD:  03/26/02 Job: [id number]

## 2011-05-06 NOTE — Discharge Summary (Signed)
[facility]  Patient:    FRONIE, HOLSTEIN Visit Number: [id number] MRN: [id number]          Service Type: MED Location: 4A A428 01 Attending Physician:  Lazaro Arms Dictated by:   Turner Daniels, M.D. Admit Date:  03/26/2002 Disc. Date: 03/29/02   CC:         Franky Macho, M.D.   Discharge Summary  DISCHARGE DIAGNOSES: 1. Status post a total abdominal hysterectomy and left salpingo-oophorectomy. 2. Unremarkable course. 3. Anemia. 4. Status post repair of incisional hernia.  PROCEDURES: 1. Total abdominal hysterectomy, left salpingo-oophorectomy. 2. Repair of incisional hernia.  HOSPITAL COURSE:  Patient was admitted after surgery.  Please refer to the transcribed history and physical as well as the operative reports for details of admission to the hospital.  The patient had an unremarkable postoperative course.  She tolerated progression of her diet from clear liquids to regular.  She had normal bowel function with flatus and bowel movement.  Her hemoglobin and hematocrit on postoperative day #1 was 8.3 and 25.0 and on postoperative day #3 was 8.1 and 23.8.  It is noted that she had a preoperative H&H of 9.8 and 29.3.  Patient ambulated without difficulty, tolerated a transition from IV to oral pain medicine, and voided without symptoms.  She was discharged to home on the morning of postoperative day #3 in good stable condition to follow up in the office on Tuesday for incision management.  She does have a vertical incision. Her incision was clean, dry, and intact at the time of discharge.  She is discharged to home on Demerol, Toradol, Climara 0.1 mg patch, Xanax, Prevacid, and Mylicon-80.  She is given instructions and precautions for recontacting our office prior to her return visit on Tuesday. Dictated by:   Turner Daniels, M.D. Attending Physician:  Lazaro Arms DD:  03/29/02 TD:  03/29/02 Job: [id number]

## 2011-07-19 ENCOUNTER — Emergency Department (HOSPITAL_BASED_OUTPATIENT_CLINIC_OR_DEPARTMENT_OTHER)
Admission: EM | Admit: 2011-07-19 | Discharge: 2011-07-19 | Disposition: A | Discharge status: Home / Self Care | Payer: Self-pay | Attending: Emergency Medicine | Admitting: Emergency Medicine

## 2011-07-19 DIAGNOSIS — M25559 Pain in unspecified hip: Secondary | ICD-10-CM | POA: Insufficient documentation

## 2011-07-19 DIAGNOSIS — I10 Essential (primary) hypertension: Secondary | ICD-10-CM | POA: Insufficient documentation

## 2011-07-19 DIAGNOSIS — G8929 Other chronic pain: Secondary | ICD-10-CM | POA: Insufficient documentation

## 2011-07-19 DIAGNOSIS — F17200 Nicotine dependence, unspecified, uncomplicated: Secondary | ICD-10-CM | POA: Insufficient documentation

## 2011-07-19 DIAGNOSIS — M549 Dorsalgia, unspecified: Secondary | ICD-10-CM | POA: Insufficient documentation

## 2011-07-19 HISTORY — DX: Dorsalgia, unspecified: M54.9

## 2011-07-19 HISTORY — DX: Essential (primary) hypertension: I10

## 2011-07-19 HISTORY — DX: Other chronic pain: G89.29

## 2011-07-19 MED ORDER — OXYCODONE-ACETAMINOPHEN 5-325 MG PO TABS: 2.0000 | ORAL_TABLET | ORAL | Status: AC | PRN

## 2011-07-19 MED ORDER — PREDNISONE 10 MG PO TABS: 20.0000 mg | ORAL_TABLET | Freq: Every day | ORAL | Status: AC

## 2011-07-19 NOTE — ED Notes (Signed)
Hx of chronic back pain and lef t hip pain-c/o worse x 2 days-denies recent injury

## 2011-07-19 NOTE — ED Provider Notes (Signed)
Medical screening examination/treatment/procedure(s) were performed by non-physician practitioner and as supervising physician I was immediately available for consultation/collaboration.   Charles B. Bernette Mayers, MD 07/19/11 1251

## 2011-07-19 NOTE — ED Provider Notes (Signed)
History     Chief Complaint  Patient presents with  . Back Pain  . Hip Pain   Patient is a 50 y.o. female presenting with back pain and hip pain. The history is provided by the patient.  Back Pain  This is a chronic problem. The current episode started more than 2 days ago. The problem has been gradually worsening. The pain is associated with lifting heavy objects. The pain is present in the lumbar spine. The quality of the pain is described as stabbing and aching. The pain does not radiate. The pain is at a severity of 8/10. The pain is moderate. The symptoms are aggravated by bending, twisting and certain positions. The pain is the same all the time. She has tried nothing for the symptoms. Risk factors: surgery.  Hip Pain  Pt has had back surgery.  Pt reports she is on ultram and hydrocodone for pain,  Past Medical History  Diagnosis Date  . Chronic back pain   . Hypertension     Past Surgical History  Procedure Date  . Back surgery   . Abdominal surgery   . Cesarean section   . Ventral hernia repair   . Abdominal hysterectomy     History reviewed. No pertinent family history.  History  Substance Use Topics  . Smoking status: Current Everyday Smoker  . Smokeless tobacco: Not on file  . Alcohol Use: No    OB History    Grav Para Term Preterm Abortions TAB SAB Ect Mult Living                  Review of Systems  Musculoskeletal: Positive for back pain.  Skin: Negative.   All other systems reviewed and are negative.    Physical Exam  BP 141/76  Pulse 70  Temp(Src) 99.1 F (37.3 C) (Oral)  Resp 16  Ht 5\' 4"  (1.626 m)  Wt 160 lb (72.576 kg)  BMI 27.46 kg/m2  SpO2 99%  Physical Exam  Constitutional: She appears well-developed and well-nourished.  HENT:  Head: Normocephalic.  Eyes: Conjunctivae and EOM are normal. Pupils are equal, round, and reactive to light.  Neck: Normal range of motion. Neck supple.  Cardiovascular: Normal rate.   Pulmonary/Chest:  Effort normal.  Abdominal: Soft.  Musculoskeletal: Normal range of motion.  Neurological: She is alert.  Skin: Skin is warm and dry.  Psychiatric: She has a normal mood and affect.  LS spine diffusely tender,  From,  nv and ns intact  ED Course  Procedures  MDM       Langston Masker, [place] 07/19/11 1241

## 2011-07-25 ENCOUNTER — Telehealth (HOSPITAL_BASED_OUTPATIENT_CLINIC_OR_DEPARTMENT_OTHER): Payer: Self-pay | Admitting: Emergency Medicine

## 2011-09-05 ENCOUNTER — Emergency Department (HOSPITAL_BASED_OUTPATIENT_CLINIC_OR_DEPARTMENT_OTHER)
Admission: EM | Admit: 2011-09-05 | Discharge: 2011-09-05 | Disposition: A | Discharge status: Home / Self Care | Payer: Self-pay | Attending: Emergency Medicine | Admitting: Emergency Medicine

## 2011-09-05 ENCOUNTER — Emergency Department (INDEPENDENT_AMBULATORY_CARE_PROVIDER_SITE_OTHER): Payer: Self-pay

## 2011-09-05 ENCOUNTER — Emergency Department (HOSPITAL_COMMUNITY)
Admission: EM | Admit: 2011-09-05 | Discharge: 2011-09-05 | Disposition: A | Discharge status: Home / Self Care | Payer: Self-pay | Attending: Emergency Medicine | Admitting: Emergency Medicine

## 2011-09-05 ENCOUNTER — Encounter (HOSPITAL_BASED_OUTPATIENT_CLINIC_OR_DEPARTMENT_OTHER): Payer: Self-pay | Admitting: Family Medicine

## 2011-09-05 DIAGNOSIS — M25559 Pain in unspecified hip: Secondary | ICD-10-CM

## 2011-09-05 DIAGNOSIS — I8390 Asymptomatic varicose veins of unspecified lower extremity: Secondary | ICD-10-CM | POA: Insufficient documentation

## 2011-09-05 DIAGNOSIS — M5430 Sciatica, unspecified side: Secondary | ICD-10-CM | POA: Insufficient documentation

## 2011-09-05 DIAGNOSIS — G8929 Other chronic pain: Secondary | ICD-10-CM | POA: Insufficient documentation

## 2011-09-05 DIAGNOSIS — M545 Low back pain: Secondary | ICD-10-CM

## 2011-09-05 DIAGNOSIS — F17200 Nicotine dependence, unspecified, uncomplicated: Secondary | ICD-10-CM | POA: Insufficient documentation

## 2011-09-05 DIAGNOSIS — I10 Essential (primary) hypertension: Secondary | ICD-10-CM | POA: Insufficient documentation

## 2011-09-05 DIAGNOSIS — I509 Heart failure, unspecified: Secondary | ICD-10-CM | POA: Insufficient documentation

## 2011-09-05 MED ORDER — PERCOCET 5-325 MG PO TABS: 1.0000 | ORAL_TABLET | Freq: Four times a day (QID) | ORAL | Status: AC | PRN

## 2011-09-05 MED ORDER — OXYCODONE-ACETAMINOPHEN 5-325 MG PO TABS
1.0000 | ORAL_TABLET | Freq: Once | ORAL | Status: AC
  Administered 2011-09-05: 1 via ORAL
  Filled 2011-09-05: qty 1

## 2011-09-05 NOTE — ED Notes (Signed)
Pt c/o left low back pain radiating down back of left leg. Pt c/o back pain x 1 month. Pt reports h/o "fusion and sciatica" and sts "this is not sciatica".

## 2011-09-05 NOTE — ED Provider Notes (Addendum)
History     CSN: [id number] Arrival date & time: 09/05/2011 10:18 AM   Chief Complaint  Patient presents with  . Back Pain     (Include location/radiation/quality/duration/timing/severity/associated sxs/prior treatment) Patient is a 50 y.o. female presenting with back pain. The history is provided by the patient.  Back Pain  This is a chronic problem. The pain is associated with no known injury. Pertinent negatives include no fever, no numbness, no weight loss, no bowel incontinence, no perianal numbness, no dysuria, no pelvic pain, no leg pain and no weakness.   the patient had spinal fusion done by Dr. Jordan Likes in 1999. She describes chronic intermittent pain in the lower back and into the left hip particularly over the past 2-3 months she has been seen by her primary care physician and also in the ED for the same she was given hydrocodone which she states is no longer helping. Patient is requesting x-rays   Past Medical History  Diagnosis Date  . Chronic back pain   . Hypertension      Past Surgical History  Procedure Date  . Back surgery   . Abdominal surgery   . Cesarean section   . Ventral hernia repair   . Abdominal hysterectomy     No family history on file.  History  Substance Use Topics  . Smoking status: Current Everyday Smoker  . Smokeless tobacco: Not on file  . Alcohol Use: No    OB History    Grav Para Term Preterm Abortions TAB SAB Ect Mult Living                  Review of Systems  Constitutional: Negative for fever and weight loss.  Gastrointestinal: Negative for bowel incontinence.  Genitourinary: Negative for dysuria and pelvic pain.  Musculoskeletal: Positive for back pain.  Neurological: Negative for weakness and numbness.  All other systems reviewed and are negative.    Allergies  Penicillins  Home Medications   Current Outpatient Rx  Name Route Sig Dispense Refill  . HYDROCODONE-ACETAMINOPHEN 5-325 MG PO TABS Oral Take 2 tablets by  mouth 2 (two) times daily.      Marland Kitchen CLONAZEPAM 0.5 MG PO TABS Oral Take 0.5 mg by mouth as needed.      Marland Kitchen LISINOPRIL 10 MG PO TABS Oral Take 10 mg by mouth daily.      . TRAMADOL HCL 50 MG PO TABS Oral Take 50 mg by mouth as needed.        Physical Exam    BP 156/87  Pulse 88  Temp(Src) 98 F (36.7 C) (Oral)  Resp 18  SpO2 100%  Physical Exam  Constitutional: She is oriented to person, place, and time. She appears well-developed and well-nourished.  HENT:  Head: Normocephalic and atraumatic.  Eyes: Conjunctivae and EOM are normal. Pupils are equal, round, and reactive to light.  Neck: Neck supple.  Cardiovascular: Normal rate and regular rhythm.  Exam reveals no gallop and no friction rub.   No murmur heard. Pulmonary/Chest: Breath sounds normal. She has no wheezes. She has no rales. She exhibits no tenderness.  Abdominal: Soft. Bowel sounds are normal. She exhibits no distension. There is no tenderness. There is no rebound and no guarding.  Musculoskeletal: Normal range of motion.       Mild diffuse paralumbar tenderness. There is no specific spinal tenderness. No redness or swelling or ecchymosis.  Neurological: She is alert and oriented to person, place, and time. She displays normal reflexes.  No cranial nerve deficit. She exhibits normal muscle tone. Coordination normal.  Skin: Skin is warm and dry. No rash noted.  Psychiatric: She has a normal mood and affect.    ED Course  Procedures  Results for orders placed during the hospital encounter of 11/30/10  CBC      Component Value Range   WBC 11.7 (*) 4.0 - 10.5 (K/uL)   RBC 4.19  3.87 - 5.11 (MIL/uL)   Hemoglobin 12.9  12.0 - 15.0 (g/dL)   HCT 16.1  09.6 - 04.5 (%)   MCV 92.6  78.0 - 100.0 (fL)   MCH 30.8  26.0 - 34.0 (pg)   MCHC 33.2  30.0 - 36.0 (g/dL)   RDW 40.9  81.1 - 91.4 (%)   Platelets 229  150 - 400 (K/uL)  POCT I-STAT, CHEM 8      Component Value Range   Sodium 141  135 - 145 (mEq/L)   Potassium 3.4 (*)  3.5 - 5.1 (mEq/L)   Chloride 103  96 - 112 (mEq/L)   BUN 7  6 - 23 (mg/dL)   Creatinine, Ser 0.8  0.4 - 1.2 (mg/dL)   Glucose, Bld 782 (*) 70 - 99 (mg/dL)   Calcium, Ion 9.56  2.13 - 1.32 (mmol/L)   TCO2 27  0 - 100 (mmol/L)   Hemoglobin 13.6  12.0 - 15.0 (g/dL)   HCT 08.6  57.8 - 46.9 (%)   No results found.   No diagnosis found.   MDM Pt is seen and examined;  Initial history and physical completed.  Will follow.         Assata Juncaj A. Patrica Duel, MD 09/05/11 1048  Results for orders placed during the hospital encounter of 11/30/10  CBC      Component Value Range   WBC 11.7 (*) 4.0 - 10.5 (K/uL)   RBC 4.19  3.87 - 5.11 (MIL/uL)   Hemoglobin 12.9  12.0 - 15.0 (g/dL)   HCT 62.9  52.8 - 41.3 (%)   MCV 92.6  78.0 - 100.0 (fL)   MCH 30.8  26.0 - 34.0 (pg)   MCHC 33.2  30.0 - 36.0 (g/dL)   RDW 24.4  01.0 - 27.2 (%)   Platelets 229  150 - 400 (K/uL)  POCT I-STAT, CHEM 8      Component Value Range   Sodium 141  135 - 145 (mEq/L)   Potassium 3.4 (*) 3.5 - 5.1 (mEq/L)   Chloride 103  96 - 112 (mEq/L)   BUN 7  6 - 23 (mg/dL)   Creatinine, Ser 0.8  0.4 - 1.2 (mg/dL)   Glucose, Bld 536 (*) 70 - 99 (mg/dL)   Calcium, Ion 6.44  0.34 - 1.32 (mmol/L)   TCO2 27  0 - 100 (mmol/L)   Hemoglobin 13.6  12.0 - 15.0 (g/dL)   HCT 74.2  59.5 - 63.8 (%)   Dg Lumbar Spine 2-3 Views  09/05/2011  *RADIOLOGY REPORT*  Clinical Data: Low back pain radiating into the left hip.  No known injury.  LUMBAR SPINE - 2-3 VIEW  Comparison: Lumbar spine radiographs 11/30/2010.  Findings: As correlated with prior examinations, there are four lumbar type vertebral bodies.  The patient is status post laminectomy and PLIF at L4-S1.  The hardware is intact.  The alignment is stable with a mild convex right scoliosis.  The additional disc spaces are preserved.  No acute osseous findings are demonstrated.  IMPRESSION: Intact hardware and stable alignment status post L4-S1 fusion.  No acute osseous findings.  Original  Report Authenticated By: Gerrianne Scale, M.D.   Dg Hip Complete Left  09/05/2011  *RADIOLOGY REPORT*  Clinical Data: Low back and left hip pain.  No known injury.  LEFT HIP - COMPLETE 2+ VIEW  Comparison: None.  Findings: The mineralization and alignment are normal.  There is no evidence of acute fracture or dislocation.  There is no evidence of femoral head osteonecrosis.  Hip joint spaces are preserved.  There are postsurgical changes in the lower lumbar spine.  IMPRESSION: No acute osseous findings or significant arthropathic changes.  Original Report Authenticated By: Gerrianne Scale, M.D.      Selisa Tensley A. Patrica Duel, MD 09/05/11 1610

## 2011-09-09 LAB — DIFFERENTIAL
Basophils Absolute: 0.1
Basophils Relative: 1
Eosinophils Absolute: 0.3
Eosinophils Relative: 2
Lymphocytes Relative: 33
Lymphs Abs: 3.8
Monocytes Absolute: 0.5
Monocytes Relative: 5
Neutro Abs: 6.9
Neutrophils Relative %: 60

## 2011-09-09 LAB — CBC
HCT: 46.4 — ABNORMAL HIGH
Hemoglobin: 15.6 — ABNORMAL HIGH
MCV: 91.8
RBC: 5.05
WBC: 11.6 — ABNORMAL HIGH

## 2011-09-09 LAB — URINALYSIS, ROUTINE W REFLEX MICROSCOPIC
Ketones, ur: NEGATIVE
Nitrite: NEGATIVE
Protein, ur: NEGATIVE
pH: 7

## 2011-09-09 LAB — I-STAT 8, (EC8 V) (CONVERTED LAB)
Acid-Base Excess: 2
BUN: 10
Bicarbonate: 26.4 — ABNORMAL HIGH
Chloride: 109
Glucose, Bld: 91
HCT: 48 — ABNORMAL HIGH
Hemoglobin: 16.3 — ABNORMAL HIGH
Operator id: 146091
Potassium: 3.9
Sodium: 140
TCO2: 28
pCO2, Ven: 41.5 — ABNORMAL LOW
pH, Ven: 7.411 — ABNORMAL HIGH

## 2011-09-09 LAB — HEPATIC FUNCTION PANEL
ALT: 22
Alkaline Phosphatase: 64
Total Protein: 7

## 2011-09-09 LAB — POCT I-STAT CREATININE
Creatinine, Ser: 0.8
Operator id: 146091

## 2011-09-20 ENCOUNTER — Emergency Department (HOSPITAL_COMMUNITY)
Admission: EM | Admit: 2011-09-20 | Discharge: 2011-09-21 | Disposition: A | Discharge status: Home / Self Care | Payer: Self-pay | Attending: Emergency Medicine | Admitting: Emergency Medicine

## 2011-09-20 DIAGNOSIS — R079 Chest pain, unspecified: Secondary | ICD-10-CM | POA: Insufficient documentation

## 2011-09-20 DIAGNOSIS — I10 Essential (primary) hypertension: Secondary | ICD-10-CM | POA: Insufficient documentation

## 2011-09-20 DIAGNOSIS — F1110 Opioid abuse, uncomplicated: Secondary | ICD-10-CM | POA: Insufficient documentation

## 2011-09-20 DIAGNOSIS — F411 Generalized anxiety disorder: Secondary | ICD-10-CM | POA: Insufficient documentation

## 2011-09-20 LAB — COMPREHENSIVE METABOLIC PANEL
ALT: 10 U/L (ref 0–35)
AST: 11 U/L (ref 0–37)
Albumin: 3.7 g/dL (ref 3.5–5.2)
Alkaline Phosphatase: 52 U/L (ref 39–117)
Potassium: 4.1 mEq/L (ref 3.5–5.1)
Sodium: 141 mEq/L (ref 135–145)
Total Protein: 6.5 g/dL (ref 6.0–8.3)

## 2011-09-20 LAB — RAPID URINE DRUG SCREEN, HOSP PERFORMED: Amphetamines: NOT DETECTED

## 2011-09-20 LAB — POCT I-STAT TROPONIN I: Troponin i, poc: 0 ng/mL (ref 0.00–0.08)

## 2011-09-20 LAB — ACETAMINOPHEN LEVEL: Acetaminophen (Tylenol), Serum: <15 ug/mL (ref 10–30)

## 2011-09-20 LAB — DIFFERENTIAL
Basophils Absolute: 0 10*3/uL (ref 0.0–0.1)
Eosinophils Absolute: 0.2 10*3/uL (ref 0.0–0.7)
Eosinophils Relative: 3 % (ref 0–5)
Lymphocytes Relative: 46 % (ref 12–46)

## 2011-09-20 LAB — CBC
HCT: 41.1 % (ref 36.0–46.0)
Platelets: 315 10*3/uL (ref 150–400)
RDW: 12.6 % (ref 11.5–15.5)
WBC: 7.8 10*3/uL (ref 4.0–10.5)

## 2012-02-25 ENCOUNTER — Emergency Department (HOSPITAL_COMMUNITY): Payer: Self-pay

## 2012-02-25 ENCOUNTER — Encounter (HOSPITAL_COMMUNITY): Payer: Self-pay | Admitting: *Deleted

## 2012-02-25 ENCOUNTER — Inpatient Hospital Stay (HOSPITAL_COMMUNITY)
Admission: EM | Admit: 2012-02-25 | Discharge: 2012-02-28 | DRG: 378 | Disposition: A | Discharge status: Home / Self Care | Payer: Self-pay | Attending: Internal Medicine | Admitting: Internal Medicine

## 2012-02-25 ENCOUNTER — Other Ambulatory Visit: Payer: Self-pay

## 2012-02-25 DIAGNOSIS — K254 Chronic or unspecified gastric ulcer with hemorrhage: Principal | ICD-10-CM | POA: Diagnosis present

## 2012-02-25 DIAGNOSIS — K8689 Other specified diseases of pancreas: Secondary | ICD-10-CM | POA: Diagnosis present

## 2012-02-25 DIAGNOSIS — I10 Essential (primary) hypertension: Secondary | ICD-10-CM | POA: Diagnosis present

## 2012-02-25 DIAGNOSIS — D649 Anemia, unspecified: Secondary | ICD-10-CM

## 2012-02-25 DIAGNOSIS — F17200 Nicotine dependence, unspecified, uncomplicated: Secondary | ICD-10-CM | POA: Diagnosis present

## 2012-02-25 DIAGNOSIS — K921 Melena: Secondary | ICD-10-CM | POA: Diagnosis present

## 2012-02-25 DIAGNOSIS — K56609 Unspecified intestinal obstruction, unspecified as to partial versus complete obstruction: Secondary | ICD-10-CM | POA: Diagnosis present

## 2012-02-25 DIAGNOSIS — K922 Gastrointestinal hemorrhage, unspecified: Secondary | ICD-10-CM | POA: Diagnosis present

## 2012-02-25 DIAGNOSIS — R109 Unspecified abdominal pain: Secondary | ICD-10-CM | POA: Diagnosis present

## 2012-02-25 DIAGNOSIS — R112 Nausea with vomiting, unspecified: Secondary | ICD-10-CM | POA: Diagnosis present

## 2012-02-25 DIAGNOSIS — K2270 Barrett's esophagus without dysplasia: Secondary | ICD-10-CM | POA: Diagnosis present

## 2012-02-25 DIAGNOSIS — F419 Anxiety disorder, unspecified: Secondary | ICD-10-CM

## 2012-02-25 DIAGNOSIS — E871 Hypo-osmolality and hyponatremia: Secondary | ICD-10-CM | POA: Diagnosis present

## 2012-02-25 DIAGNOSIS — R000 Tachycardia, unspecified: Secondary | ICD-10-CM | POA: Diagnosis present

## 2012-02-25 DIAGNOSIS — R1013 Epigastric pain: Secondary | ICD-10-CM | POA: Diagnosis present

## 2012-02-25 DIAGNOSIS — R197 Diarrhea, unspecified: Secondary | ICD-10-CM | POA: Diagnosis present

## 2012-02-25 DIAGNOSIS — K529 Noninfective gastroenteritis and colitis, unspecified: Secondary | ICD-10-CM

## 2012-02-25 DIAGNOSIS — D72829 Elevated white blood cell count, unspecified: Secondary | ICD-10-CM | POA: Diagnosis present

## 2012-02-25 DIAGNOSIS — Q433 Congenital malformations of intestinal fixation: Secondary | ICD-10-CM

## 2012-02-25 DIAGNOSIS — D62 Acute posthemorrhagic anemia: Secondary | ICD-10-CM | POA: Diagnosis present

## 2012-02-25 DIAGNOSIS — K5289 Other specified noninfective gastroenteritis and colitis: Secondary | ICD-10-CM

## 2012-02-25 DIAGNOSIS — K560 Paralytic ileus: Secondary | ICD-10-CM | POA: Diagnosis present

## 2012-02-25 HISTORY — DX: Ulcer of esophagus without bleeding: K22.10

## 2012-02-25 LAB — POCT I-STAT TROPONIN I

## 2012-02-25 LAB — URINALYSIS, ROUTINE W REFLEX MICROSCOPIC
Bilirubin Urine: NEGATIVE
Glucose, UA: NEGATIVE mg/dL
Hgb urine dipstick: NEGATIVE
Specific Gravity, Urine: 1.027 (ref 1.005–1.030)
pH: 5.5 (ref 5.0–8.0)

## 2012-02-25 LAB — LACTIC ACID, PLASMA: Lactic Acid, Venous: 1.3 mmol/L (ref 0.5–2.2)

## 2012-02-25 LAB — OCCULT BLOOD, POC DEVICE: Fecal Occult Bld: POSITIVE

## 2012-02-25 LAB — CBC
HCT: 27.1 % — ABNORMAL LOW (ref 36.0–46.0)
Hemoglobin: 9.7 g/dL — ABNORMAL LOW (ref 12.0–15.0)
RBC: 3.07 MIL/uL — ABNORMAL LOW (ref 3.87–5.11)
WBC: 16.4 10*3/uL — ABNORMAL HIGH (ref 4.0–10.5)

## 2012-02-25 LAB — DIFFERENTIAL
Lymphocytes Relative: 20 % (ref 12–46)
Monocytes Absolute: 0.8 10*3/uL (ref 0.1–1.0)
Monocytes Relative: 5 % (ref 3–12)
Neutro Abs: 12.3 10*3/uL — ABNORMAL HIGH (ref 1.7–7.7)
Neutrophils Relative %: 75 % (ref 43–77)

## 2012-02-25 LAB — ABO/RH: ABO/RH(D): B POS

## 2012-02-25 LAB — COMPREHENSIVE METABOLIC PANEL
ALT: 14 U/L (ref 0–35)
AST: 11 U/L (ref 0–37)
Albumin: 3.8 g/dL (ref 3.5–5.2)
Alkaline Phosphatase: 54 U/L (ref 39–117)
GFR calc Af Amer: >90 mL/min (ref >90)
Glucose, Bld: 160 mg/dL — ABNORMAL HIGH (ref 70–99)
Potassium: 3.9 mEq/L (ref 3.5–5.1)
Sodium: 132 mEq/L — ABNORMAL LOW (ref 135–145)
Total Protein: 6.3 g/dL (ref 6.0–8.3)

## 2012-02-25 LAB — RETICULOCYTES: RBC.: 2.83 MIL/uL — ABNORMAL LOW (ref 3.87–5.11)

## 2012-02-25 MED ORDER — HYDROMORPHONE HCL PF 1 MG/ML IJ SOLN
1.0000 mg | Freq: Once | INTRAMUSCULAR | Status: AC
  Administered 2012-02-25: 1 mg via INTRAVENOUS
  Filled 2012-02-25: qty 1

## 2012-02-25 MED ORDER — INFLUENZA VIRUS VACC SPLIT PF IM SUSP
0.5000 mL | INTRAMUSCULAR | Status: AC
  Filled 2012-02-25: qty 0.5

## 2012-02-25 MED ORDER — ACETAMINOPHEN 650 MG RE SUPP: 650.0000 mg | Freq: Four times a day (QID) | RECTAL | Status: DC | PRN

## 2012-02-25 MED ORDER — SODIUM CHLORIDE 0.9 % IV SOLN
80.0000 mg | Freq: Once | INTRAVENOUS | Status: AC
  Administered 2012-02-25: 80 mg via INTRAVENOUS
  Filled 2012-02-25: qty 80

## 2012-02-25 MED ORDER — HYDROMORPHONE HCL PF 1 MG/ML IJ SOLN
1.0000 mg | INTRAMUSCULAR | Status: DC | PRN
  Administered 2012-02-26 (×5): 1 mg via INTRAVENOUS
  Filled 2012-02-25 (×7): qty 1

## 2012-02-25 MED ORDER — PNEUMOCOCCAL VAC POLYVALENT 25 MCG/0.5ML IJ INJ
0.5000 mL | INJECTION | INTRAMUSCULAR | Status: AC
  Filled 2012-02-25: qty 0.5

## 2012-02-25 MED ORDER — HYDROMORPHONE HCL PF 1 MG/ML IJ SOLN: 1.0000 mg | INTRAMUSCULAR | Status: DC | PRN

## 2012-02-25 MED ORDER — SODIUM CHLORIDE 0.9 % IV SOLN
Freq: Once | INTRAVENOUS | Status: AC
  Administered 2012-02-25: 18:00:00 via INTRAVENOUS

## 2012-02-25 MED ORDER — ONDANSETRON HCL 4 MG/2ML IJ SOLN
4.0000 mg | Freq: Once | INTRAMUSCULAR | Status: AC
  Administered 2012-02-25: 4 mg via INTRAVENOUS
  Filled 2012-02-25: qty 2

## 2012-02-25 MED ORDER — SODIUM CHLORIDE 0.9 % IV SOLN
8.0000 mg/h | INTRAVENOUS | Status: DC
  Administered 2012-02-25: 8 mg/h via INTRAVENOUS
  Filled 2012-02-25 (×4): qty 80

## 2012-02-25 MED ORDER — MORPHINE SULFATE 4 MG/ML IJ SOLN
4.0000 mg | Freq: Once | INTRAMUSCULAR | Status: AC
  Administered 2012-02-25: 4 mg via INTRAVENOUS
  Filled 2012-02-25: qty 1

## 2012-02-25 MED ORDER — SODIUM CHLORIDE 0.9 % IV BOLUS (SEPSIS)
1000.0000 mL | Freq: Once | INTRAVENOUS | Status: AC
  Administered 2012-02-25: 1000 mL via INTRAVENOUS

## 2012-02-25 MED ORDER — ACETAMINOPHEN 325 MG PO TABS
650.0000 mg | ORAL_TABLET | Freq: Four times a day (QID) | ORAL | Status: DC | PRN
  Administered 2012-02-27: 650 mg via ORAL
  Filled 2012-02-25 (×2): qty 2

## 2012-02-25 MED ORDER — ONDANSETRON HCL 4 MG/2ML IJ SOLN
4.0000 mg | Freq: Four times a day (QID) | INTRAMUSCULAR | Status: AC
  Administered 2012-02-26 (×2): 4 mg via INTRAVENOUS
  Filled 2012-02-25 (×2): qty 2

## 2012-02-25 MED ORDER — SODIUM CHLORIDE 0.9 % IV SOLN
INTRAVENOUS | Status: AC
  Administered 2012-02-25: 21:00:00 via INTRAVENOUS

## 2012-02-25 MED ORDER — IOHEXOL 300 MG/ML  SOLN
100.0000 mL | Freq: Once | INTRAMUSCULAR | Status: AC | PRN
  Administered 2012-02-25: 100 mL via INTRAVENOUS

## 2012-02-25 MED ORDER — SODIUM CHLORIDE 0.9 % IV SOLN
INTRAVENOUS | Status: DC
  Administered 2012-02-26 (×2): via INTRAVENOUS

## 2012-02-25 MED ORDER — SODIUM CHLORIDE 0.9 % IV SOLN
8.0000 mg/h | INTRAVENOUS | Status: DC
  Administered 2012-02-26: 8 mg/h via INTRAVENOUS
  Filled 2012-02-25 (×4): qty 80

## 2012-02-25 NOTE — ED Notes (Signed)
Pt daughters name: Carollee Herter.Phone Number: [phone]

## 2012-02-25 NOTE — ED Notes (Signed)
Attempted to call report; RN unable to take report at this time.

## 2012-02-25 NOTE — ED Notes (Signed)
Family at bedside. 

## 2012-02-25 NOTE — H&P (Signed)
PCP:   Jessie Foot, RN, Nursing/RN Coord   Chief Complaint:  Nausea, vomiting, diarrhea since yesterday.  HPI: 51 year old lady with h/o Barrets esophagus ulcer , s/p extensive abdominal surgery, chronic abdominal pain, came in for worsening abdominal pain, nausea, vomiting and diarrhea since yesterday. Similar complaints at home with her grandson few days ago. She reports feeling warm subjectively, no fevers . Black stools since yesterday , with an average of 15 per day. abdominal pain is crampy and generalized. No Hematemesis. No Hematochezia. She underwent a colonoscopy and EGD in 2000. Colonoscopy was normal and EGD showed barrets esophagus, underwent fundoplication. She also reports generalized weakness. Denies NSAID use, or recent antibiotic use.   Review of Systems:  The patient denies anorexia, weight loss,, vision loss, decreased hearing, hoarseness,  syncope, peripheral edema, balance deficits, hemoptysis, hematochezia, severe indigestion/heartburn, hematuria, incontinence, genital sores,  suspicious skin lesions, transient blindness, difficulty walking, depression, unusual weight change, abnormal bleeding, enlarged lymph nodes, angioedema, and breast masses.  Past Medical History: Past Medical History  Diagnosis Date  . Chronic back pain   . Hypertension   . Barrett's esophageal ulceration    Past Surgical History  Procedure Date  . Back surgery   . Abdominal surgery     at age 7 for malrotation; appendectomy  . Cesarean section   . Ventral hernia repair 2003  . Abdominal hysterectomy 2003    with Left Salpingo  . Laparoscopic incisional / umbilical / ventral hernia repair 2008  . Laparoscopic nissen fundoplication 2000    Medications: Prior to Admission medications   Medication Sig Start Date End Date Taking? Authorizing Provider  ondansetron (ZOFRAN) 8 MG tablet Take 8 mg by mouth every 8 (eight) hours as needed. For nausea   Yes Historical Provider, MD     Allergies:   Allergies  Allergen Reactions  . Penicillins     Facial swelling, red skin    Social History:  reports that she has been smoking.  She does not have any smokeless tobacco history on file. She reports that she does not drink alcohol or use illicit drugs.   Family History: History reviewed. No pertinent family history.  Physical Exam: Filed Vitals:   02/25/12 1618 02/25/12 1800 02/25/12 1845 02/25/12 2003  BP: 139/95 146/88 152/99 138/96  Pulse: 122 108 115 117  Temp: 99.4 F (37.4 C)   98.8 F (37.1 C)  TempSrc: Oral   Oral  Resp: 19 24 20 22   SpO2: 100% 100% 100% 100%   Constitutional: Vital signs reviewed.  Patient is a well-developed and well-nourished in no mild distress and cooperative with exam. Alert and oriented x3.  Head: Normocephalic and atraumatic Mouth: no erythema or exudates, dry mucous membranes Eyes: PERRL, EOMI, conjunctivae pale, No scleral icterus.  Neck: Supple, Trachea midline normal ROM, No JVD, mass, thyromegaly, or carotid bruit present.  Cardiovascular: tachycardic S1 normal, S2 normal, no MRG, pulses symmetric and intact bilaterally Pulmonary/Chest: CTAB, no wheezes, rales, or rhonchi Abdominal: Soft. Mildly distended, tender around the umbilical area, bowel sounds are normal, no masses, organomegaly, or guarding present.  Musculoskeletal: No joint deformities, erythema, or stiffness, ROM full and no nontender Neurological: A&O x3, Strenght is normal and symmetric bilaterally, cranial nerve II-XII are grossly intact, no focal motor deficit, sensory intact to light touch bilaterally.  Skin: Warm, dry and intact. No rash, cyanosis, or clubbing.       Labs on Admission:   [facility] 02/25/12 1545  NA 132*  K 3.9  CL 99  CO2 22  GLUCOSE 160*  BUN 47*  CREATININE 0.73  CALCIUM 9.5  MG --  PHOS --    Basename 02/25/12 1545  AST 11  ALT 14  ALKPHOS 54  BILITOT 0.3  PROT 6.3  ALBUMIN 3.8    Basename 02/25/12 1545   LIPASE 14  AMYLASE --    Basename 02/25/12 1830  WBC 16.4*  NEUTROABS 12.3*  HGB 9.7*  HCT 27.1*  MCV 88.3  PLT 283   No results found for this basename: CKTOTAL:3,CKMB:3,CKMBINDEX:3,TROPONINI:3 in the last 72 hours No results found for this basename: TSH,T4TOTAL,FREET3,T3FREE,THYROIDAB in the last 72 hours No results found for this basename: VITAMINB12:2,FOLATE:2,FERRITIN:2,TIBC:2,IRON:2,RETICCTPCT:2 in the last 72 hours  Radiological Exams on Admission: Ct Abdomen Pelvis W Contrast  02/25/2012  *RADIOLOGY REPORT*  Clinical Data: Abdominal pain.  Nausea and vomiting.  Previous appendectomy and hysterectomy.  Previous hernia repair.  CT ABDOMEN AND PELVIS WITH CONTRAST  Technique:  Multidetector CT imaging of the abdomen and pelvis was performed following the standard protocol during bolus administration of intravenous contrast.  Contrast: OMNIPAQUE IOHEXOL 300 MG/ML IJ SOLN  Comparison: 11/30/2010  Findings: there is scarring at the lung bases.  No active process evident.  No pleural or pericardial fluid.  The liver has a normal appearance without focal lesions or biliary ductal dilatation.  No calcified gallstones.  The spleen is normal. There are surgical clips in the splenic hilum region.   The pancreatic duct is slightly prominent.  There is low density in the pancreatic head near the ampulla.  I cannot rule out the possibility of a pancreatic mass in the early stages.  This could be approached either with endoscopy to visualize the ampulla area or MRI of the abdomen or both.  The pancreatic duct was prominent previously, but the appearance of the pancreatic head and ampulla is different.  The adrenal glands are normal.  The kidneys are normal except for 4 mm cyst in the lower pole left kidney.  No mass, stone or hydronephrosis.  The aorta shows mild atherosclerotic change but no aneurysm.  The IVC is normal.  No retroperitoneal mass or adenopathy.  No free intraperitoneal fluid or air.   There is mild dilatation of the small intestine with fluid and air. This raises the possibility of a partial small bowel obstruction. As noted previously, there appears of intestinal malrotation with the colon entirely located on the left in the small bowel primarily located on the right.  There is a surgical clip in the left lower quadrant that I think relates to previous appendectomy.  There is been previous fusion of L5-S1.  No acute bony finding.  IMPRESSION: Partial small bowel obstruction.  Note that there is malrotation with the colon being entirely located on the left.  Chronic dilatation of the pancreatic duct.  Newly seen low density in the region of the pancreatic head and prominence of the ampulla. The possibility of a pancreatic head and or ampullary mass does exist.  I suggest endoscopy to evaluate the ampullary region, possibly in combination with MRI to evaluate the pancreatic head.  Original Report Authenticated By: Thomasenia Sales, M.D.   Dg Chest Portable 1 View  02/25/2012  *RADIOLOGY REPORT*  Clinical Data: 51 year old female with cough.  History of previous abdominal surgery, Barrett's esophagus and nissen fundoplication.  PORTABLE CHEST - 1 VIEW  Comparison: 11/30/2010  Findings: The cardiomediastinal silhouette is unremarkable. The lungs are clear. There is no evidence  of focal airspace disease, pulmonary edema, suspicious pulmonary nodule/mass, pleural effusion, or pneumothorax. No acute bony abnormalities are identified. Remote right-sided rib fractures are present. There is no definite pneumoperitoneum on this upright film.  IMPRESSION: No evidence of acute abnormality.  Original Report Authenticated By: Rosendo Gros, M.D.    Assessment/Plan Present on Admission:  .Suspected GI bleed: with malena, and HGB drop of more than 3 gms since October. Her stool for occult blood is positive, but no hematochezia.  Will admit her to telemetry, serial H&H Q8hr, clear liquid diet , IV  Protonix.  Gi consult from [facility] has been consulted for possible EGD.  .Nausea & vomiting/ Diarrhea/ Abdominal pain: probably viral gastroenteritis, Even though she has leukocytosis, which i think is probably reactive , will not start her on antibiotics yet. Will get stool work up, with a a c diff PCR. Pain control, anti emetics and IV fluids.  .SBO (small bowel obstruction); ct abd and pelvis shows evidence of partial SBO, clinically she doesn't look obstructed. She is passing flatus and has bowel movements. Will start her on clear liquid diet and monitor. Surgery consult called and recommended the same.  .Barrett esophagus: s/p surgery, she will probably need repeat EGD with biopsies.  .Leukocytosis: most likely reactive, but if she spikes fever and doesn't improve. shewill need to be started on IV zosyn.  Marland KitchenHyponatremia: proably secondary to dehydration. On IV FLUIDS. Will repeat labs in am.  .Anemia: normocytic, hgb drop of more than 3 gms in the last 6 months. Anemia panel. Will transfuse if hemoglobin drops to less than 7.  Pancreatic Duct dilatation/ ? Pancreatic mass ? Low density around the pancreatic Head: she will need an MRI of the abdomen for further evaluation. [facility] on board.   Full Code.   Time spent on this patient including examination and decision-making process: 55 minutes.  Heather [name] [phone] 02/25/2012, 9:15 PM

## 2012-02-25 NOTE — Consult Note (Signed)
Reason for Consult:partial small bowel obstruction Referring Physician: Dr Valentina Lucks is an 51 y.o. female.  HPI: 50 year old Caucasian female came to the ER earlier today because of a one-day history of nausea followed by crampy abdominal pain and diarrhea. She states that the discomfort is pretty much constant however it will increase in severity at times. It will then ease up. She states that she had multiple episodes of solid black stools throughout yesterday and throughout the evening. She had one episode of emesis yesterday she reports some subjective fevers and chills. The pain is mainly on the left thigh and around her umbilical area. She states that she's never had any pain like this before. She also states that she felt a little bit bloated. She also states that she has some chest pain earlier today. Her last bowel movement was earlier today. She is still having flatus. She denies any weight loss. She will take Goody's powder on a rare occasion. She states that her grandson also had crampy abdominal pain and nausea and loose stools yesterday. She states that she's had a colonoscopy many years ago and was normal. She denies any use of blood thinners. She denies any jaundice, hematemesis, or hematochezia. She has an extensive abdominal history.  Past Medical History  Diagnosis Date  . Chronic back pain   . Hypertension   . Barrett's esophageal ulceration     Past Surgical History  Procedure Date  . Back surgery   . Abdominal surgery     at age 69 for malrotation; appendectomy  . Cesarean section   . Ventral hernia repair 2003  . Abdominal hysterectomy 2003    with Left Salpingo  . Laparoscopic incisional / umbilical / ventral hernia repair 2008  . Laparoscopic nissen fundoplication 2000    History reviewed. No pertinent family history.  Social History:  reports that she has been smoking.  She does not have any smokeless tobacco history on file. She reports that she  does not drink alcohol or use illicit drugs.  Allergies:  Allergies  Allergen Reactions  . Penicillins     Facial swelling, red skin    Medications: I have reviewed the patient's current medications.  Results for orders placed during the hospital encounter of 02/25/12 (from the past 48 hour(s))  OCCULT BLOOD, POC DEVICE     Status: Normal   Collection Time   02/25/12  3:39 PM      Component Value Range Comment   Fecal Occult Bld POSITIVE     COMPREHENSIVE METABOLIC PANEL     Status: Abnormal   Collection Time   02/25/12  3:45 PM      Component Value Range Comment   Sodium 132 (*) 135 - 145 (mEq/L)    Potassium 3.9  3.5 - 5.1 (mEq/L)    Chloride 99  96 - 112 (mEq/L)    CO2 22  19 - 32 (mEq/L)    Glucose, Bld 160 (*) 70 - 99 (mg/dL)    BUN 47 (*) 6 - 23 (mg/dL)    Creatinine, Ser 1.61  0.50 - 1.10 (mg/dL)    Calcium 9.5  8.4 - 10.5 (mg/dL)    Total Protein 6.3  6.0 - 8.3 (g/dL)    Albumin 3.8  3.5 - 5.2 (g/dL)    AST 11  0 - 37 (U/L)    ALT 14  0 - 35 (U/L)    Alkaline Phosphatase 54  39 - 117 (U/L)    Total  Bilirubin 0.3  0.3 - 1.2 (mg/dL)    GFR calc non Af Amer >90  >90 (mL/min)    GFR calc Af Amer >90  >90 (mL/min)   LIPASE, BLOOD     Status: Normal   Collection Time   02/25/12  3:45 PM      Component Value Range Comment   Lipase 14  11 - 59 (U/L)   TYPE AND SCREEN     Status: Normal   Collection Time   02/25/12  3:55 PM      Component Value Range Comment   ABO/RH(D) B POS      Antibody Screen NEG      Sample Expiration 02/28/2012     POCT I-STAT TROPONIN I     Status: Normal   Collection Time   02/25/12  4:02 PM      Component Value Range Comment   Troponin i, poc 0.00  0.00 - 0.08 (ng/mL)    Comment 3            URINALYSIS, ROUTINE W REFLEX MICROSCOPIC     Status: Normal   Collection Time   02/25/12  4:29 PM      Component Value Range Comment   Color, Urine YELLOW  YELLOW     APPearance CLEAR  CLEAR     Specific Gravity, Urine 1.027  1.005 - 1.030     pH 5.5  5.0  - 8.0     Glucose, UA NEGATIVE  NEGATIVE (mg/dL)    Hgb urine dipstick NEGATIVE  NEGATIVE     Bilirubin Urine NEGATIVE  NEGATIVE     Ketones, ur NEGATIVE  NEGATIVE (mg/dL)    Protein, ur NEGATIVE  NEGATIVE (mg/dL)    Urobilinogen, UA 0.2  0.0 - 1.0 (mg/dL)    Nitrite NEGATIVE  NEGATIVE     Leukocytes, UA NEGATIVE  NEGATIVE  MICROSCOPIC NOT DONE ON URINES WITH NEGATIVE PROTEIN, BLOOD, LEUKOCYTES, NITRITE, OR GLUCOSE <1000 mg/dL.  LACTIC ACID, PLASMA     Status: Normal   Collection Time   02/25/12  4:30 PM      Component Value Range Comment   Lactic Acid, Venous 1.3  0.5 - 2.2 (mmol/L)   CBC     Status: Abnormal   Collection Time   02/25/12  6:30 PM      Component Value Range Comment   WBC 16.4 (*) 4.0 - 10.5 (K/uL)    RBC 3.07 (*) 3.87 - 5.11 (MIL/uL)    Hemoglobin 9.7 (*) 12.0 - 15.0 (g/dL)    HCT 16.1 (*) 09.6 - 46.0 (%)    MCV 88.3  78.0 - 100.0 (fL)    MCH 31.6  26.0 - 34.0 (pg)    MCHC 35.8  30.0 - 36.0 (g/dL)    RDW 04.5  40.9 - 81.1 (%)    Platelets 283  150 - 400 (K/uL)   DIFFERENTIAL     Status: Abnormal   Collection Time   02/25/12  6:30 PM      Component Value Range Comment   Neutrophils Relative 75  43 - 77 (%)    Neutro Abs 12.3 (*) 1.7 - 7.7 (K/uL)    Lymphocytes Relative 20  12 - 46 (%)    Lymphs Abs 3.3  0.7 - 4.0 (K/uL)    Monocytes Relative 5  3 - 12 (%)    Monocytes Absolute 0.8  0.1 - 1.0 (K/uL)    Eosinophils Relative 0  0 - 5 (%)  Eosinophils Absolute 0.0  0.0 - 0.7 (K/uL)    Basophils Relative 0  0 - 1 (%)    Basophils Absolute 0.0  0.0 - 0.1 (K/uL)     Ct Abdomen Pelvis W Contrast  02/25/2012  *RADIOLOGY REPORT*  Clinical Data: Abdominal pain.  Nausea and vomiting.  Previous appendectomy and hysterectomy.  Previous hernia repair.  CT ABDOMEN AND PELVIS WITH CONTRAST  Technique:  Multidetector CT imaging of the abdomen and pelvis was performed following the standard protocol during bolus administration of intravenous contrast.  Contrast: OMNIPAQUE  IOHEXOL 300 MG/ML IJ SOLN  Comparison: 11/30/2010  Findings: there is scarring at the lung bases.  No active process evident.  No pleural or pericardial fluid.  The liver has a normal appearance without focal lesions or biliary ductal dilatation.  No calcified gallstones.  The spleen is normal. There are surgical clips in the splenic hilum region.   The pancreatic duct is slightly prominent.  There is low density in the pancreatic head near the ampulla.  I cannot rule out the possibility of a pancreatic mass in the early stages.  This could be approached either with endoscopy to visualize the ampulla area or MRI of the abdomen or both.  The pancreatic duct was prominent previously, but the appearance of the pancreatic head and ampulla is different.  The adrenal glands are normal.  The kidneys are normal except for 4 mm cyst in the lower pole left kidney.  No mass, stone or hydronephrosis.  The aorta shows mild atherosclerotic change but no aneurysm.  The IVC is normal.  No retroperitoneal mass or adenopathy.  No free intraperitoneal fluid or air.  There is mild dilatation of the small intestine with fluid and air. This raises the possibility of a partial small bowel obstruction. As noted previously, there appears of intestinal malrotation with the colon entirely located on the left in the small bowel primarily located on the right.  There is a surgical clip in the left lower quadrant that I think relates to previous appendectomy.  There is been previous fusion of L5-S1.  No acute bony finding.  IMPRESSION: Partial small bowel obstruction.  Note that there is malrotation with the colon being entirely located on the left.  Chronic dilatation of the pancreatic duct.  Newly seen low density in the region of the pancreatic head and prominence of the ampulla. The possibility of a pancreatic head and or ampullary mass does exist.  I suggest endoscopy to evaluate the ampullary region, possibly in combination with MRI to  evaluate the pancreatic head.  Original Report Authenticated By: Thomasenia Sales, M.D.   Dg Chest Portable 1 View  02/25/2012  *RADIOLOGY REPORT*  Clinical Data: 51 year old female with cough.  History of previous abdominal surgery, Barrett's esophagus and nissen fundoplication.  PORTABLE CHEST - 1 VIEW  Comparison: 11/30/2010  Findings: The cardiomediastinal silhouette is unremarkable. The lungs are clear. There is no evidence of focal airspace disease, pulmonary edema, suspicious pulmonary nodule/mass, pleural effusion, or pneumothorax. No acute bony abnormalities are identified. Remote right-sided rib fractures are present. There is no definite pneumoperitoneum on this upright film.  IMPRESSION: No evidence of acute abnormality.  Original Report Authenticated By: Rosendo Gros, M.D.    Review of Systems  Constitutional: Positive for fever (subjective) and chills. Negative for weight loss and diaphoresis.  HENT: Negative for hearing loss.   Eyes: Negative for discharge and redness.  Respiratory: Negative for cough, shortness of breath and wheezing.  Cardiovascular: Positive for chest pain (had chest pain today-none prior to today). Negative for palpitations, orthopnea and claudication.  Gastrointestinal:       See hpi  Genitourinary: Negative for dysuria and urgency.  Musculoskeletal: Positive for joint pain.  Skin: Negative for rash.  Neurological: Negative for dizziness, seizures, loss of consciousness and headaches.  Endo/Heme/Allergies: Negative for polydipsia.  Psychiatric/Behavioral: Negative for suicidal ideas.   Blood pressure 138/96, pulse 117, temperature 98.8 F (37.1 C), temperature source Oral, resp. rate 22, SpO2 100.00%. Physical Exam  Vitals reviewed. Constitutional: She is oriented to person, place, and time. She appears well-developed and well-nourished. She is cooperative.  Non-toxic appearance. No distress.  HENT:  Head: Normocephalic and atraumatic.  Right Ear:  External ear normal.  Left Ear: External ear normal.  Eyes: Conjunctivae are normal. No scleral icterus.  Neck: Normal range of motion. Neck supple. No tracheal deviation present. No thyromegaly present.  Cardiovascular: Regular rhythm and intact distal pulses.  Tachycardia present.   Respiratory: Effort normal and breath sounds normal. No respiratory distress. She has no wheezes.  GI: Soft. Bowel sounds are normal. There is no rebound and no guarding.         Multiple well healed incisions. No hernia. Soft, possible mild distension. Mild TTP around umbilicus, left abdomen.   Musculoskeletal: Normal range of motion. She exhibits no edema and no tenderness.  Lymphadenopathy:    She has no cervical adenopathy.  Neurological: She is alert and oriented to person, place, and time. She exhibits normal muscle tone.  Skin: Skin is warm and dry. No rash noted. No erythema.  Psychiatric: She has a normal mood and affect. Her behavior is normal. Judgment and thought content normal.    Assessment/Plan: Chronic intestinal malrotation Dilated pancreatic duct with possibility of panc head mass - rec outpt workup Abdominal pain, melena, Nausea: gastroenteritis vs GI bleed  I reviewed the CT scans myself. Her small bowel dilation is not that impressive. There is no bowel wall thickening, pneumatosis. I think this is more consistent with an ileus-type picture as a result of her gi bleed vs gastroenteritis . She clinically is nonobstructed.  rec admission to hospital for pain control, ivf, antiemetics Repeat labs in am Hold chemical VTE prophylaxis Repeat 2view abdominal xray in am rec GI medicine consult.  Mary Sella. Andrey Campanile, MD, FACS General, Bariatric, & Minimally Invasive Surgery [facility], [place]  [facility] M 02/25/2012, 8:30 PM

## 2012-02-25 NOTE — ED Notes (Signed)
MD at bedside. 

## 2012-02-25 NOTE — ED Notes (Signed)
[id number]<[id number]> Expected date:<BR> Expected time:<BR> Means of arrival:<BR> Comments:<BR> abd pain, tachy 142

## 2012-02-25 NOTE — ED Notes (Signed)
Spoke with [place] from lab- redraw on blood "it clotted"- requested to redraw blood- sending lab person to redraw from this pt now

## 2012-02-25 NOTE — ED Provider Notes (Signed)
History     CSN: [id number]  Arrival date & time 02/25/12  1449   First MD Initiated Contact with Patient 02/25/12 1518      Chief Complaint  Patient presents with  . Abdominal Pain    (Consider location/radiation/quality/duration/timing/severity/associated sxs/prior treatment) HPI History provided by pt.   Pt developed intermittent, central abdominal cramping last night.  Associated w/ 12-14 episodes of diarrhea, black in color, as well as 2 episodes of non-bloody emesis.  Has felt weak and lightheaded and is experiencing mild tightness in center of chest as well as dyspnea since the middle of the night.  No fever or GU sx.  No h/o GI bleed.  She is not anti-coagulated.  H/o barrett's esophagitis. 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  Past Medical History  Diagnosis Date  . Chronic back pain   . Hypertension   . Barrett's esophageal ulceration     Past Surgical History  Procedure Date  . Back surgery   . Abdominal surgery   . Cesarean section   . Ventral hernia repair   . Abdominal hysterectomy     History reviewed. No pertinent family history.  History  Substance Use Topics  . Smoking status: Current Everyday Smoker  . Smokeless tobacco: Not on file  . Alcohol Use: No    OB History    Grav Para Term Preterm Abortions TAB SAB Ect Mult Living                  Review of Systems  All other systems reviewed and are negative.    Allergies  Penicillins  Home Medications   Current Outpatient Rx  Name Route Sig Dispense Refill  . ONDANSETRON HCL 8 MG PO TABS Oral Take 8 mg by mouth every 8 (eight) hours as needed. For nausea      BP 118/89  Pulse 144  Temp(Src) 99.7 F (37.6 C) (Oral)  Resp 20  SpO2 100%  Physical Exam  Nursing note and vitals reviewed. Constitutional: She is oriented to person, place, and time. She appears well-developed and  well-nourished. No distress.  HENT:  Head: Normocephalic and atraumatic.  Mouth/Throat: Oropharynx is clear and moist.  Eyes:       Normal appearance  Neck: Normal range of motion.  Cardiovascular: Regular rhythm.        tachycardia  Pulmonary/Chest: Effort normal and breath sounds normal.  Abdominal: Soft. Bowel sounds are normal. She exhibits no distension and no mass. There is no rebound and no guarding.       Diffuse, mild tenderness reported to be worst LLQ.  No CVA tenderness  Genitourinary:       Nursing staff report that patient's stool is watery and black  Neurological: She is alert and oriented to person, place, and time.  Skin: Skin is warm and dry. No rash noted.  Psychiatric: Her behavior is normal.       Mildly anxious appearing    ED Course  Procedures (including critical care time)   Date: 02/25/2012  Rate: 144  Rhythm: sinus tachycardia  QRS Axis: normal  Intervals: normal  ST/T Wave abnormalities: nonspecific ST changes  Conduction Disutrbances:none  Narrative Interpretation:   Old EKG Reviewed: changes noted (tachycardia today)   Labs Reviewed  COMPREHENSIVE METABOLIC PANEL - Abnormal; Notable for the following:    Sodium 132 (*)    Glucose, Bld 160 (*)    BUN 47 (*)  All other components within normal limits  CBC - Abnormal; Notable for the following:    WBC 16.4 (*)    RBC 3.07 (*)    Hemoglobin 9.7 (*)    HCT 27.1 (*)    All other components within normal limits  DIFFERENTIAL - Abnormal; Notable for the following:    Neutro Abs 12.3 (*)    All other components within normal limits  RETICULOCYTES - Abnormal; Notable for the following:    RBC. 2.83 (*)    All other components within normal limits  TYPE AND SCREEN  LIPASE, BLOOD  URINALYSIS, ROUTINE W REFLEX MICROSCOPIC  OCCULT BLOOD, POC DEVICE  LACTIC ACID, PLASMA  POCT I-STAT TROPONIN I  ABO/RH  CBC  DIFFERENTIAL  VITAMIN B12  FOLATE  IRON AND TIBC  FERRITIN  STOOL CULTURE    CLOSTRIDIUM DIFFICILE BY PCR  FECAL LACTOFERRIN  TSH  COMPREHENSIVE METABOLIC PANEL  CBC  PROTIME-INR  APTT  HEMOGLOBIN AND HEMATOCRIT, BLOOD  HEMOGLOBIN AND HEMATOCRIT, BLOOD   Ct Abdomen Pelvis W Contrast  02/25/2012  *RADIOLOGY REPORT*  Clinical Data: Abdominal pain.  Nausea and vomiting.  Previous appendectomy and hysterectomy.  Previous hernia repair.  CT ABDOMEN AND PELVIS WITH CONTRAST  Technique:  Multidetector CT imaging of the abdomen and pelvis was performed following the standard protocol during bolus administration of intravenous contrast.  Contrast: OMNIPAQUE IOHEXOL 300 MG/ML IJ SOLN  Comparison: 11/30/2010  Findings: there is scarring at the lung bases.  No active process evident.  No pleural or pericardial fluid.  The liver has a normal appearance without focal lesions or biliary ductal dilatation.  No calcified gallstones.  The spleen is normal. There are surgical clips in the splenic hilum region.   The pancreatic duct is slightly prominent.  There is low density in the pancreatic head near the ampulla.  I cannot rule out the possibility of a pancreatic mass in the early stages.  This could be approached either with endoscopy to visualize the ampulla area or MRI of the abdomen or both.  The pancreatic duct was prominent previously, but the appearance of the pancreatic head and ampulla is different.  The adrenal glands are normal.  The kidneys are normal except for 4 mm cyst in the lower pole left kidney.  No mass, stone or hydronephrosis.  The aorta shows mild atherosclerotic change but no aneurysm.  The IVC is normal.  No retroperitoneal mass or adenopathy.  No free intraperitoneal fluid or air.  There is mild dilatation of the small intestine with fluid and air. This raises the possibility of a partial small bowel obstruction. As noted previously, there appears of intestinal malrotation with the colon entirely located on the left in the small bowel primarily located on the  right.  There is a surgical clip in the left lower quadrant that I think relates to previous appendectomy.  There is been previous fusion of L5-S1.  No acute bony finding.  IMPRESSION: Partial small bowel obstruction.  Note that there is malrotation with the colon being entirely located on the left.  Chronic dilatation of the pancreatic duct.  Newly seen low density in the region of the pancreatic head and prominence of the ampulla. The possibility of a pancreatic head and or ampullary mass does exist.  I suggest endoscopy to evaluate the ampullary region, possibly in combination with MRI to evaluate the pancreatic head.  Original Report Authenticated By: Thomasenia Sales, M.D.   Dg Chest Portable 1 View  02/25/2012  *  RADIOLOGY REPORT*  Clinical Data: 51 year old female with cough.  History of previous abdominal surgery, Barrett's esophagus and nissen fundoplication.  PORTABLE CHEST - 1 VIEW  Comparison: 11/30/2010  Findings: The cardiomediastinal silhouette is unremarkable. The lungs are clear. There is no evidence of focal airspace disease, pulmonary edema, suspicious pulmonary nodule/mass, pleural effusion, or pneumothorax. No acute bony abnormalities are identified. Remote right-sided rib fractures are present. There is no definite pneumoperitoneum on this upright film.  IMPRESSION: No evidence of acute abnormality.  Original Report Authenticated By: Rosendo Gros, M.D.     1. GI bleed   2. Gastroenteritis   3. Anemia       MDM  50yo F presents w/ abdominal pain, N/V/D and melena.  Also c/o lightheadedness, SOB and chest tightness.  Black, watery stool reported by nursing staff.  On exam, afebrile, tachycardic, uncomfortable appearing, abd diffusely tender, worst in LLQ.  Pt receiving IV fluids as well as morphine, zofran and protonix.  EKG non-ischemic.  Hemoccult pos and rest of labs are pending.  CT abd/pelvis has been ordered.    Pt reports that her pain is mildly improved but nausea has  returned since she started drinking contrast.  Will treat w/ another round of morphine/zofran.  Labs sig for mild hyponatremia and hyperglycemia and neg lactate and troponin.  CBC pending because clotted.  5:29 PM   Hgb 9.3 currently which has decreased from 13.9 in 09/2011.  CT shows partial small bowel obstruction and possible lesion of pancreatic head.  All results discussed w/ patient.  She has received three rounds of pain and nausea medication and symptoms are improved.  Consulted General Surgery and they will see patient in ED.    General surgery has seen patient and does not believe she has a SBO; suspect gastroenteritis.  GI has agreed to see patient in consult for GI bleed and [facility] has agreed to admit.  Pt currently stable.          Otilio Miu, [place] 02/25/12 [phone]

## 2012-02-25 NOTE — ED Notes (Signed)
Patient is resting comfortably. 

## 2012-02-25 NOTE — ED Notes (Signed)
Patient transported to CT 

## 2012-02-25 NOTE — ED Notes (Signed)
Per EMS: pt from home c/o epigastric and upper abdomnal abd constant cramping pain x 1 day.

## 2012-02-26 ENCOUNTER — Inpatient Hospital Stay (HOSPITAL_COMMUNITY): Payer: Self-pay

## 2012-02-26 ENCOUNTER — Encounter (HOSPITAL_COMMUNITY)
Admission: EM | Disposition: A | Discharge status: Home / Self Care | Payer: Self-pay | Source: Home / Self Care | Attending: Family Medicine

## 2012-02-26 HISTORY — PX: ESOPHAGOGASTRODUODENOSCOPY: SHX5428

## 2012-02-26 LAB — FECAL LACTOFERRIN, QUANT: Fecal Lactoferrin: POSITIVE

## 2012-02-26 LAB — CLOSTRIDIUM DIFFICILE BY PCR: Toxigenic C. Difficile by PCR: NEGATIVE

## 2012-02-26 LAB — CBC
HCT: 19.4 % — ABNORMAL LOW (ref 36.0–46.0)
MCH: 32.2 pg (ref 26.0–34.0)
MCHC: 36 g/dL (ref 30.0–36.0)
MCHC: 35.1 g/dL (ref 30.0–36.0)
MCV: 89.1 fL (ref 78.0–100.0)
MCV: 89.4 fL (ref 78.0–100.0)
Platelets: 237 10*3/uL (ref 150–400)
Platelets: 209 10*3/uL (ref 150–400)
RBC: 2.27 MIL/uL — ABNORMAL LOW (ref 3.87–5.11)
RDW: 12.5 % (ref 11.5–15.5)
RDW: 12.6 % (ref 11.5–15.5)
WBC: 9.2 10*3/uL (ref 4.0–10.5)

## 2012-02-26 LAB — PROTIME-INR
INR: 1.12 (ref 0.00–1.49)
Prothrombin Time: 14.6 seconds (ref 11.6–15.2)

## 2012-02-26 LAB — COMPREHENSIVE METABOLIC PANEL
Alkaline Phosphatase: 37 U/L — ABNORMAL LOW (ref 39–117)
BUN: 28 mg/dL — ABNORMAL HIGH (ref 6–23)
Chloride: 103 mEq/L (ref 96–112)
Creatinine, Ser: 0.67 mg/dL (ref 0.50–1.10)
GFR calc Af Amer: >90 mL/min (ref >90)
GFR calc non Af Amer: >90 mL/min (ref >90)
Glucose, Bld: 112 mg/dL — ABNORMAL HIGH (ref 70–99)
Potassium: 3.6 mEq/L (ref 3.5–5.1)
Total Bilirubin: 0.2 mg/dL — ABNORMAL LOW (ref 0.3–1.2)

## 2012-02-26 LAB — IRON AND TIBC
Saturation Ratios: 73 % — ABNORMAL HIGH (ref 20–55)
UIBC: 66 ug/dL — ABNORMAL LOW (ref 125–400)

## 2012-02-26 LAB — APTT: aPTT: 25 seconds (ref 24–37)

## 2012-02-26 LAB — FOLATE: Folate: 17.9 ng/mL

## 2012-02-26 SURGERY — EGD (ESOPHAGOGASTRODUODENOSCOPY)
Anesthesia: Moderate Sedation

## 2012-02-26 SURGERY — ESOPHAGOSCOPY
Anesthesia: Moderate Sedation

## 2012-02-26 MED ORDER — ZOLPIDEM TARTRATE 5 MG PO TABS
5.0000 mg | ORAL_TABLET | Freq: Every evening | ORAL | Status: DC | PRN
  Administered 2012-02-26 – 2012-02-28 (×2): 5 mg via ORAL
  Filled 2012-02-26 (×2): qty 1

## 2012-02-26 MED ORDER — PANTOPRAZOLE SODIUM 40 MG PO TBEC
40.0000 mg | DELAYED_RELEASE_TABLET | Freq: Two times a day (BID) | ORAL | Status: DC
  Administered 2012-02-26 – 2012-02-28 (×4): 40 mg via ORAL
  Filled 2012-02-26 (×6): qty 1

## 2012-02-26 MED ORDER — BUTAMBEN-TETRACAINE-BENZOCAINE 2-2-14 % EX AERO
INHALATION_SPRAY | CUTANEOUS | Status: DC | PRN
  Administered 2012-02-26: 2 via TOPICAL

## 2012-02-26 MED ORDER — FENTANYL NICU IV SYRINGE 50 MCG/ML
INJECTION | INTRAMUSCULAR | Status: DC | PRN
  Administered 2012-02-26 (×4): 25 ug via INTRAVENOUS

## 2012-02-26 MED ORDER — ONDANSETRON HCL 4 MG/2ML IJ SOLN: 4.0000 mg | Freq: Four times a day (QID) | INTRAMUSCULAR | Status: DC | PRN

## 2012-02-26 MED ORDER — LORAZEPAM 0.5 MG PO TABS
0.5000 mg | ORAL_TABLET | Freq: Four times a day (QID) | ORAL | Status: DC | PRN
  Administered 2012-02-26 – 2012-02-28 (×6): 0.5 mg via ORAL
  Filled 2012-02-26 (×6): qty 1

## 2012-02-26 MED ORDER — MIDAZOLAM HCL 10 MG/2ML IJ SOLN
INTRAMUSCULAR | Status: DC | PRN
  Administered 2012-02-26: 1 mg via INTRAVENOUS
  Administered 2012-02-26 (×2): 2 mg via INTRAVENOUS
  Administered 2012-02-26: 1.5 mg via INTRAVENOUS
  Administered 2012-02-26: 2 mg via INTRAVENOUS
  Administered 2012-02-26: 1.5 mg via INTRAVENOUS

## 2012-02-26 MED ORDER — PANTOPRAZOLE SODIUM 40 MG PO TBEC: 40.0000 mg | DELAYED_RELEASE_TABLET | Freq: Two times a day (BID) | ORAL | Status: DC

## 2012-02-26 MED ORDER — ONDANSETRON HCL 4 MG/2ML IJ SOLN
4.0000 mg | Freq: Four times a day (QID) | INTRAMUSCULAR | Status: DC | PRN
  Administered 2012-02-26 – 2012-02-28 (×5): 4 mg via INTRAVENOUS
  Filled 2012-02-26 (×5): qty 2

## 2012-02-26 NOTE — Consult Note (Signed)
[facility] Consultation Note  Referring Provider: Dr. Brendia Sacks [facility])  Reason for Consultation:  Melena, anemia  HPI: Heather [name] is a 51 y.o. female admitted for GI bleeding.  In static state of GI health until Friday, two days ago, when she had acute onset of periumbilical and abdominal discomfort associated with loose black stool.  Has had upwards of 10-15 black loose stools per day for the past couple days.  Some nausea; no vomiting (post-Nissen).  No prior GI bleeding.  Has had endoscopies in the past for dyspepsia + GERD, and has history of Barrett's which has been monitored over the years, but she has not had an endoscopy in several years.  Rare BC powders (couple per in the past couple weeks), otherwise no NSAID's.   Past Medical History  Diagnosis Date  . Chronic back pain   . Hypertension   . Barrett's esophageal ulceration     Past Surgical History  Procedure Date  . Back surgery   . Abdominal surgery     at age 38 for malrotation; appendectomy  . Cesarean section   . Ventral hernia repair 2003  . Abdominal hysterectomy 2003    with Left Salpingo  . Laparoscopic incisional / umbilical / ventral hernia repair 2008  . Laparoscopic nissen fundoplication 2000    Prior to Admission medications   Medication Sig Start Date End Date Taking? Authorizing Provider  ondansetron (ZOFRAN) 8 MG tablet Take 8 mg by mouth every 8 (eight) hours as needed. For nausea   Yes Historical Provider, MD    Current Facility-Administered Medications  Medication Dose Route Frequency Provider Last Rate Last Dose  . 0.9 %  sodium chloride infusion   Intravenous Once Forbes Cellar, MD      . 0.9 %  sodium chloride infusion   Intravenous STAT Otilio Miu, PA 250 mL/hr at 02/25/12 2109    . 0.9 %  sodium chloride infusion   Intravenous Continuous Standley Brooking, MD 100 mL/hr at 02/26/12 0056    . acetaminophen (TYLENOL) tablet 650 mg  650 mg Oral Q6H PRN Kathlen Mody, MD       Or  . acetaminophen (TYLENOL) suppository 650 mg  650 mg Rectal Q6H PRN Kathlen Mody, MD      . HYDROmorphone (DILAUDID) injection 1 mg  1 mg Intravenous Once Otilio Miu, PA   1 mg at 02/25/12 1852  . HYDROmorphone (DILAUDID) injection 1 mg  1 mg Intravenous Once Otilio Miu, PA   1 mg at 02/25/12 2022  . HYDROmorphone (DILAUDID) injection 1 mg  1 mg Intravenous Q3H PRN Kathlen Mody, MD   1 mg at 02/26/12 0856  . influenza  inactive virus vaccine (FLUZONE/FLUARIX) injection 0.5 mL  0.5 mL Intramuscular Tomorrow-1000 Kathlen Mody, MD      . iohexol (OMNIPAQUE) 300 MG/ML solution 100 mL  100 mL Intravenous Once PRN Medication Radiologist, MD   100 mL at 02/25/12 1747  . morphine 4 MG/ML injection 4 mg  4 mg Intravenous Once Otilio Miu, PA   4 mg at 02/25/12 1612  . morphine 4 MG/ML injection 4 mg  4 mg Intravenous Once Otilio Miu, PA   4 mg at 02/25/12 1757  . ondansetron (ZOFRAN) injection 4 mg  4 mg Intravenous Once Otilio Miu, PA   4 mg at 02/25/12 1554  . ondansetron (ZOFRAN) injection 4 mg  4 mg Intravenous Once Otilio Miu, PA   4 mg  at 02/25/12 1756  . ondansetron (ZOFRAN) injection 4 mg  4 mg Intravenous Once Otilio Miu, PA   4 mg at 02/25/12 2021  . ondansetron (ZOFRAN) injection 4 mg  4 mg Intravenous Q6H Catherine E Schinlever, PA   4 mg at 02/26/12 0700  . pantoprazole (PROTONIX) 80 mg in sodium chloride 0.9 % 100 mL IVPB  80 mg Intravenous Once Forbes Cellar, MD   80 mg at 02/25/12 1644  . pantoprazole (PROTONIX) 80 mg in sodium chloride 0.9 % 250 mL infusion  8 mg/hr Intravenous Continuous Kathlen Mody, MD 25 mL/hr at 02/26/12 0057 8 mg/hr at 02/26/12 0057  . pneumococcal 23 valent vaccine (PNU-IMMUNE) injection 0.5 mL  0.5 mL Intramuscular Tomorrow-1000 Kathlen Mody, MD      . sodium chloride 0.9 % bolus 1,000 mL  1,000 mL Intravenous Once Otilio Miu, PA   1,000 mL at 02/25/12  1551  . DISCONTD: HYDROmorphone (DILAUDID) injection 1 mg  1 mg Intravenous Q4H PRN Otilio Miu, PA      . DISCONTD: ondansetron (ZOFRAN) injection 4 mg  4 mg Intravenous Q6H PRN Jinger Neighbors, NP      . DISCONTD: pantoprazole (PROTONIX) 80 mg in sodium chloride 0.9 % 250 mL infusion  8 mg/hr Intravenous Continuous Forbes Cellar, MD 25 mL/hr at 02/25/12 1835 8 mg/hr at 02/25/12 1835    Allergies as of 02/25/2012 - Review Complete 02/25/2012  Allergen Reaction Noted  . Penicillins  07/19/2011    History reviewed. No pertinent family history.  History   Social History  . Marital Status: Legally Separated    Spouse Name: N/A    Number of Children: N/A  . Years of Education: N/A   Occupational History  . Not on file.   Social History Main Topics  . Smoking status: Current Everyday Smoker  . Smokeless tobacco: Not on file  . Alcohol Use: No  . Drug Use: No  . Sexually Active:    Other Topics Concern  . Not on file   Social History Narrative  . No narrative on file    Review of Systems: Positive for: anorexia, fatigue, weakness, malaise, back joint pain Gen: Denies any fever, chills, rigors, night sweats, , involuntary weight loss, and sleep disorder CV: Denies chest pain, angina, palpitations, syncope, orthopnea, PND, peripheral edema, and claudication. Resp: Denies dyspnea, cough, sputum, wheezing, coughing up blood. GI: Described in detail in HPI.    GU : Denies urinary burning, blood in urine, urinary frequency, urinary hesitancy, nocturnal urination, and urinary incontinence. MS: Denies or swelling.  Denies muscle weakness, cramps, atrophy.  Derm: Denies rash, itching, oral ulcerations, hives, unhealing ulcers.  Psych: Denies depression, anxiety, memory loss, suicidal ideation, hallucinations,  and confusion. Heme: Denies bruising, bleeding, and enlarged lymph nodes. Neuro:  Denies any headaches, dizziness, paresthesias. Endo:  Denies any problems with DM,  thyroid, adrenal function.  Physical Exam: Vital signs in last 24 hours: Temp:  [98.4 F (36.9 C)-99.7 F (37.6 C)] 98.4 F (36.9 C) (03/10 0636) Pulse Rate:  [97-144] 97  (03/10 0636) Resp:  [18-24] 18  (03/10 0636) BP: (116-152)/(72-99) 116/72 mmHg (03/10 0636) SpO2:  [99 %-100 %] 100 % (03/10 0636) Weight:  [76 kg (167 lb 8.8 oz)] 76 kg (167 lb 8.8 oz) (03/09 2300) Last BM Date: 02/25/12 General:   Alert,  Well-developed, well-nourished, pleasant and cooperative in NAD Head:  Normocephalic and atraumatic. Eyes:  Sclera clear, no icterus.   Conjunctiva somewhat pale. Ears:  Normal auditory acuity. Nose:  No deformity, discharge,  or lesions. Mouth:  No deformity or lesions.  Oropharynx pink & moist. Neck:  Supple; no masses or thyromegaly. Lungs:  Clear throughout to auscultation.   No wheezes, crackles, or rhonchi. No acute distress. Heart:  Regular rate and rhythm; no murmurs, clicks, rubs,  or gallops. Abdomen:  Soft, mildly distended; modestly hyperactive bowel sounds. No masses, hepatosplenomegaly or hernias noted. No guarding, and without rebound.     Msk:  Symmetrical without gross deformities. Normal posture. Pulses:  Normal pulses noted. Extremities:  Without clubbing or edema. Neurologic:  Alert and  oriented x4;  grossly normal neurologically. Skin:  Intact without significant lesions or rashes. Cervical Nodes:  No significant cervical adenopathy. Psych:  Alert and cooperative. Normal mood and affect.  Lab Results:  Basename 02/26/12 0732 02/25/12 2350 02/25/12 1830  WBC 9.2 -- 16.4*  HGB 7.7* 8.5* 9.7*  HCT 21.3* 23.9* 27.1*  PLT 237 -- 283   BMET  Basename 02/26/12 0732 02/25/12 1545  NA 133* 132*  K 3.6 3.9  CL 103 99  CO2 24 22  GLUCOSE 112* 160*  BUN 28* 47*  CREATININE 0.67 0.73  CALCIUM 7.6* 9.5   LFT  Basename 02/26/12 0732  PROT 4.7*  ALBUMIN 2.8*  AST 9  ALT 10  ALKPHOS 37*  BILITOT 0.2*  BILIDIR --  IBILI --   PT/INR  Basename  02/26/12 0732  LABPROT 14.6  INR 1.12    Studies/Results: Ct Abdomen Pelvis W Contrast  02/25/2012  *RADIOLOGY REPORT*  Clinical Data: Abdominal pain.  Nausea and vomiting.  Previous appendectomy and hysterectomy.  Previous hernia repair.  CT ABDOMEN AND PELVIS WITH CONTRAST  Technique:  Multidetector CT imaging of the abdomen and pelvis was performed following the standard protocol during bolus administration of intravenous contrast.  Contrast: OMNIPAQUE IOHEXOL 300 MG/ML IJ SOLN  Comparison: 11/30/2010  Findings: there is scarring at the lung bases.  No active process evident.  No pleural or pericardial fluid.  The liver has a normal appearance without focal lesions or biliary ductal dilatation.  No calcified gallstones.  The spleen is normal. There are surgical clips in the splenic hilum region.   The pancreatic duct is slightly prominent.  There is low density in the pancreatic head near the ampulla.  I cannot rule out the possibility of a pancreatic mass in the early stages.  This could be approached either with endoscopy to visualize the ampulla area or MRI of the abdomen or both.  The pancreatic duct was prominent previously, but the appearance of the pancreatic head and ampulla is different.  The adrenal glands are normal.  The kidneys are normal except for 4 mm cyst in the lower pole left kidney.  No mass, stone or hydronephrosis.  The aorta shows mild atherosclerotic change but no aneurysm.  The IVC is normal.  No retroperitoneal mass or adenopathy.  No free intraperitoneal fluid or air.  There is mild dilatation of the small intestine with fluid and air. This raises the possibility of a partial small bowel obstruction. As noted previously, there appears of intestinal malrotation with the colon entirely located on the left in the small bowel primarily located on the right.  There is a surgical clip in the left lower quadrant that I think relates to previous appendectomy.  There is been previous  fusion of L5-S1.  No acute bony finding.  IMPRESSION: Partial small bowel obstruction.  Note that there is malrotation  with the colon being entirely located on the left.  Chronic dilatation of the pancreatic duct.  Newly seen low density in the region of the pancreatic head and prominence of the ampulla. The possibility of a pancreatic head and or ampullary mass does exist.  I suggest endoscopy to evaluate the ampullary region, possibly in combination with MRI to evaluate the pancreatic head.  Original Report Authenticated By: Thomasenia Sales, M.D.   Dg Chest Portable 1 View  02/25/2012  *RADIOLOGY REPORT*  Clinical Data: 51 year old female with cough.  History of previous abdominal surgery, Barrett's esophagus and nissen fundoplication.  PORTABLE CHEST - 1 VIEW  Comparison: 11/30/2010  Findings: The cardiomediastinal silhouette is unremarkable. The lungs are clear. There is no evidence of focal airspace disease, pulmonary edema, suspicious pulmonary nodule/mass, pleural effusion, or pneumothorax. No acute bony abnormalities are identified. Remote right-sided rib fractures are present. There is no definite pneumoperitoneum on this upright film.  IMPRESSION: No evidence of acute abnormality.  Original Report Authenticated By: Rosendo Gros, M.D.   Dg Abd 2 Views  02/26/2012  *RADIOLOGY REPORT*  Clinical Data: Partial small bowel obstruction.  ABDOMEN - 2 VIEW  Comparison: CT of abdomen and pelvis dated 02/25/2012.  Findings: No visible dilated small bowel loops by radiography. Oral contrast ingested yesterday for CT shows interval transit with some contrast remaining in the colon.  There is no evidence of free air.  No abnormal calcifications.  Hernia mesh and lumbar fusion hardware identified.  IMPRESSION: No radiographic evidence of residual small bowel obstruction.  Original Report Authenticated By: Reola Calkins, M.D.   Impression:  1.  Melena with acute blood loss anemia.  Hemodynamically stable. 2.   Chronic GERD, post Nissen. 3.  Barrett's esophagus. 4.  Chronic intestinal malrotation. 5.  Abnormal CT pancreas.  Uncertain significance.  Plan:  1.  NPO. 2.  PPI. 3.  Supportive care with IVF and serial CBC's, with blood transfusion as needed. 4.  Endoscopy today, with further recommendations to follow. 5.  Risks (bleeding, infection, bowel perforation that could require surgery, sedation-related changes in cardiopulmonary systems), benefits (identification and possible treatment of source of symptoms, exclusion of certain causes of symptoms), and alternatives (watchful waiting, radiographic imaging studies, empiric medical treatment) of upper endoscopy (EGD) were explained to patient in detail and she wishes to proceed.   LOS: 1 day   Rooney Gladwin M  02/26/2012, 9:57 AM

## 2012-02-26 NOTE — Progress Notes (Signed)
PROGRESS NOTE  Heather [name] ZOX:[id number] DOB: November 19, 1961 DOA: 02/25/2012 PCP: Jessie Foot, RN, Nursing/RN Coord  Brief narrative: 51 year old woman presented to the emergency department with abdominal cramping and multiple episodes of black diarrhea. Symptoms were associated with lightheadedness and mild tightness in center chest. Positive sick contact. Rare use of NSAIDs.  Past medical history: Barrett's esophagus, abdominal surgery for malrotation, appendectomy, ventral hernia repair, abdominal hysterectomy, Nissen fundoplication, chronic back pain, hypertension  Consultants:  General surgery  Gastroenterology  Procedures:    Interim History: Chart reviewed in detail. Tachycardia has resolved. Vital signs stable. Hemoglobin decreased from 8.5-7.7 in 8 hours.  Subjective: Still has epigastric and upper abdominal pain. No all movements since admission to the medical floor.  Objective: Filed Vitals:   02/25/12 1845 02/25/12 2003 02/25/12 2300 02/26/12 0636  BP: 152/99 138/96 122/72 116/72  Pulse: 115 117 108 97  Temp:  98.8 F (37.1 C) 99.1 F (37.3 C) 98.4 F (36.9 C)  TempSrc:  Oral Oral Oral  Resp: 20 22 20 18   Height:   5\' 8"  (1.727 m)   Weight:   76 kg (167 lb 8.8 oz)   SpO2: 100% 100% 100% 100%    Intake/Output Summary (Last 24 hours) at 02/26/12 0738 Last data filed at 02/26/12 0700  Gross per 24 hour  Intake 1917.09 ml  Output    200 ml  Net 1717.09 ml    Exam:   General:  Appears calm and mildly uncomfortable. Nontoxic.  Cardiovascular: Regular rate and rhythm. No murmur, rub, gallop. No lower extremity edema.  Respiratory: Clear to auscultation bilaterally. No wheezes, rales, rhonchi. Respiratory effort.  Abdomen: Soft, nondistended. No lower abdominal pain to palpation. Mild upper abdominal pain palpation.  Skin: Limited exam is grossly unremarkable.  Psychiatric: Grossly normal mood and affect. Speech fluent and appropriate.  Data  Reviewed: Basic Metabolic Panel:  Lab 02/26/12 8119 02/25/12 1545  NA 133* 132*  K 3.6 3.9  CL 103 99  CO2 24 22  GLUCOSE 112* 160*  BUN 28* 47*  CREATININE 0.67 0.73  CALCIUM 7.6* 9.5  MG -- --  PHOS -- --   Liver Function Tests:  Lab 02/26/12 0732 02/25/12 1545  AST 9 11  ALT 10 14  ALKPHOS 37* 54  BILITOT 0.2* 0.3  PROT 4.7* 6.3  ALBUMIN 2.8* 3.8    Lab 02/25/12 1545  LIPASE 14  AMYLASE --   CBC:  Lab 02/26/12 0732 02/25/12 2350 02/25/12 1830  WBC 9.2 -- 16.4*  NEUTROABS -- -- 12.3*  HGB 7.7* 8.5* 9.7*  HCT 21.3* 23.9* 27.1*  MCV 89.1 -- 88.3  PLT 237 -- 283   Studies: 02/26/2012 Clinical Data: Partial small bowel obstruction.  ABDOMEN - 2 VIEW  Comparison: CT of abdomen and pelvis dated 02/25/2012.  Findings: No visible dilated small bowel loops by radiography.  Oral contrast ingested yesterday for CT shows interval transit with some contrast remaining in the colon. There is no evidence of free air. No abnormal calcifications. Hernia mesh and lumbar fusion hardware identified.  IMPRESSION: No radiographic evidence of residual small bowel obstruction.  Original Report Authenticated By: Reola Calkins, M.D.   Ct Abdomen Pelvis W Contrast  02/25/2012  *RADIOLOGY REPORT*  Clinical Data: Abdominal pain.  Nausea and vomiting.  Previous appendectomy and hysterectomy.  Previous hernia repair.  CT ABDOMEN AND PELVIS WITH CONTRAST  Technique:  Multidetector CT imaging of the abdomen and pelvis was performed following the standard protocol during bolus administration  of intravenous contrast.  Contrast: OMNIPAQUE IOHEXOL 300 MG/ML IJ SOLN  Comparison: 11/30/2010  Findings: there is scarring at the lung bases.  No active process evident.  No pleural or pericardial fluid.  The liver has a normal appearance without focal lesions or biliary ductal dilatation.  No calcified gallstones.  The spleen is normal. There are surgical clips in the splenic hilum region.   The  pancreatic duct is slightly prominent.  There is low density in the pancreatic head near the ampulla.  I cannot rule out the possibility of a pancreatic mass in the early stages.  This could be approached either with endoscopy to visualize the ampulla area or MRI of the abdomen or both.  The pancreatic duct was prominent previously, but the appearance of the pancreatic head and ampulla is different.  The adrenal glands are normal.  The kidneys are normal except for 4 mm cyst in the lower pole left kidney.  No mass, stone or hydronephrosis.  The aorta shows mild atherosclerotic change but no aneurysm.  The IVC is normal.  No retroperitoneal mass or adenopathy.  No free intraperitoneal fluid or air.  There is mild dilatation of the small intestine with fluid and air. This raises the possibility of a partial small bowel obstruction. As noted previously, there appears of intestinal malrotation with the colon entirely located on the left in the small bowel primarily located on the right.  There is a surgical clip in the left lower quadrant that I think relates to previous appendectomy.  There is been previous fusion of L5-S1.  No acute bony finding.  IMPRESSION: Partial small bowel obstruction.  Note that there is malrotation with the colon being entirely located on the left.  Chronic dilatation of the pancreatic duct.  Newly seen low density in the region of the pancreatic head and prominence of the ampulla. The possibility of a pancreatic head and or ampullary mass does exist.  I suggest endoscopy to evaluate the ampullary region, possibly in combination with MRI to evaluate the pancreatic head.  Original Report Authenticated By: Thomasenia Sales, M.D.   Dg Chest Portable 1 View  02/25/2012  *RADIOLOGY REPORT*  Clinical Data: 51 year old female with cough.  History of previous abdominal surgery, Barrett's esophagus and nissen fundoplication.  PORTABLE CHEST - 1 VIEW  Comparison: 11/30/2010  Findings: The  cardiomediastinal silhouette is unremarkable. The lungs are clear. There is no evidence of focal airspace disease, pulmonary edema, suspicious pulmonary nodule/mass, pleural effusion, or pneumothorax. No acute bony abnormalities are identified. Remote right-sided rib fractures are present. There is no definite pneumoperitoneum on this upright film.  IMPRESSION: No evidence of acute abnormality.  Original Report Authenticated By: Rosendo Gros, M.D.    Scheduled Meds:   . sodium chloride   Intravenous Once  . sodium chloride   Intravenous STAT  .  HYDROmorphone (DILAUDID) injection  1 mg Intravenous Once  .  HYDROmorphone (DILAUDID) injection  1 mg Intravenous Once  . influenza  inactive virus vaccine  0.5 mL Intramuscular Tomorrow-1000  .  morphine injection  4 mg Intravenous Once  .  morphine injection  4 mg Intravenous Once  . ondansetron (ZOFRAN) IV  4 mg Intravenous Once  . ondansetron (ZOFRAN) IV  4 mg Intravenous Once  . ondansetron (ZOFRAN) IV  4 mg Intravenous Once  . ondansetron (ZOFRAN) IV  4 mg Intravenous Q6H  . pantoprazole (PROTONIX) IV  80 mg Intravenous Once  . pneumococcal 23 valent vaccine  0.5  mL Intramuscular Tomorrow-1000  . sodium chloride  1,000 mL Intravenous Once   Continuous Infusions:   . sodium chloride 100 mL/hr at 02/26/12 0056  . pantoprozole (PROTONIX) infusion 8 mg/hr (02/26/12 0057)  . DISCONTD: pantoprozole (PROTONIX) infusion 8 mg/hr (02/25/12 1835)    EKG independently reviewed: Sinus tachycardia.   Assessment/Plan: 1. Abdominal pain/nausea/vomiting: Positive sick contact. Differential includes gastroenteritis. Followup C. difficile PCR, however acute infection with C. difficile is doubted.. 2. Melena/GI bleed: Upper GI bleed suspected given history and elevated BUN which is trending downwards. Serial CBC, IV Protonix infusion, gastroenterology consultation. Transfuse as needed. Currently hemodynamically stable.  3. Acute blood loss anemia: Plan  as above. 4. Hyponatremia: Minimal. Only offending medication could be lisinopril. Continue to follow. Repeat basic metabolic panel the morning.  5. Radiographic small bowel obstruction: Per surgery this is felt to be ileus. Repeat imaging today shows no evidence of obstruction. 6. Dilated pancreatic duct with possible ampulla or pancreatic head mass: Consider MRI when stable. Anticipate upper endoscopy. Dilatation of the pancreatic duct is chronic. 7. History of intestinal malrotation: Appears stable at this point. Appreciate surgical consultation and recommendations.  Code Status: Full code Family Communication: daughter Carollee Herter [phone] Disposition Plan: Pending further evaluation and treatment   Brendia Sacks, MD  [facility] Pager [phone] 02/26/2012, 7:38 AM    LOS: 1 day

## 2012-02-26 NOTE — Progress Notes (Signed)
Subjective: Still having loose black stools. Still with some abd discomfort-not any worse. Now has HA. No emesis  Objective: Vital signs in last 24 hours: Temp:  [98.4 F (36.9 C)-99.7 F (37.6 C)] 98.4 F (36.9 C) (03/10 0636) Pulse Rate:  [97-144] 97  (03/10 0636) Resp:  [18-24] 18  (03/10 0636) BP: (116-152)/(72-99) 116/72 mmHg (03/10 0636) SpO2:  [99 %-100 %] 100 % (03/10 0636) Weight:  [167 lb 8.8 oz (76 kg)] 167 lb 8.8 oz (76 kg) (03/09 2300) Last BM Date: 02/25/12  Intake/Output from previous day: 03/09 0701 - 03/10 0700 In: 1917.1 [I.V.:1917.1] Out: 200 [Urine:200] Intake/Output this shift:    Sitting in chair, nad, alert abd soft, very mild TTP. Mild distension  Lab Results:   Basename 02/26/12 0732 02/25/12 2350 02/25/12 1830  WBC 9.2 -- 16.4*  HGB 7.7* 8.5* --  HCT 21.3* 23.9* --  PLT 237 -- 283   BMET  Basename 02/26/12 0732 02/25/12 1545  NA 133* 132*  K 3.6 3.9  CL 103 99  CO2 24 22  GLUCOSE 112* 160*  BUN 28* 47*  CREATININE 0.67 0.73  CALCIUM 7.6* 9.5   PT/INR  Basename 02/26/12 0732  LABPROT 14.6  INR 1.12   ABG No results found for this basename: PHART:2,PCO2:2,PO2:2,HCO3:2 in the last 72 hours  Studies/Results: Ct Abdomen Pelvis W Contrast  02/25/2012  *RADIOLOGY REPORT*  Clinical Data: Abdominal pain.  Nausea and vomiting.  Previous appendectomy and hysterectomy.  Previous hernia repair.  CT ABDOMEN AND PELVIS WITH CONTRAST  Technique:  Multidetector CT imaging of the abdomen and pelvis was performed following the standard protocol during bolus administration of intravenous contrast.  Contrast: OMNIPAQUE IOHEXOL 300 MG/ML IJ SOLN  Comparison: 11/30/2010  Findings: there is scarring at the lung bases.  No active process evident.  No pleural or pericardial fluid.  The liver has a normal appearance without focal lesions or biliary ductal dilatation.  No calcified gallstones.  The spleen is normal. There are surgical clips in the  splenic hilum region.   The pancreatic duct is slightly prominent.  There is low density in the pancreatic head near the ampulla.  I cannot rule out the possibility of a pancreatic mass in the early stages.  This could be approached either with endoscopy to visualize the ampulla area or MRI of the abdomen or both.  The pancreatic duct was prominent previously, but the appearance of the pancreatic head and ampulla is different.  The adrenal glands are normal.  The kidneys are normal except for 4 mm cyst in the lower pole left kidney.  No mass, stone or hydronephrosis.  The aorta shows mild atherosclerotic change but no aneurysm.  The IVC is normal.  No retroperitoneal mass or adenopathy.  No free intraperitoneal fluid or air.  There is mild dilatation of the small intestine with fluid and air. This raises the possibility of a partial small bowel obstruction. As noted previously, there appears of intestinal malrotation with the colon entirely located on the left in the small bowel primarily located on the right.  There is a surgical clip in the left lower quadrant that I think relates to previous appendectomy.  There is been previous fusion of L5-S1.  No acute bony finding.  IMPRESSION: Partial small bowel obstruction.  Note that there is malrotation with the colon being entirely located on the left.  Chronic dilatation of the pancreatic duct.  Newly seen low density in the region of the pancreatic head and  prominence of the ampulla. The possibility of a pancreatic head and or ampullary mass does exist.  I suggest endoscopy to evaluate the ampullary region, possibly in combination with MRI to evaluate the pancreatic head.  Original Report Authenticated By: Thomasenia Sales, M.D.   Dg Chest Portable 1 View  02/25/2012  *RADIOLOGY REPORT*  Clinical Data: 51 year old female with cough.  History of previous abdominal surgery, Barrett's esophagus and nissen fundoplication.  PORTABLE CHEST - 1 VIEW  Comparison: 11/30/2010   Findings: The cardiomediastinal silhouette is unremarkable. The lungs are clear. There is no evidence of focal airspace disease, pulmonary edema, suspicious pulmonary nodule/mass, pleural effusion, or pneumothorax. No acute bony abnormalities are identified. Remote right-sided rib fractures are present. There is no definite pneumoperitoneum on this upright film.  IMPRESSION: No evidence of acute abnormality.  Original Report Authenticated By: Rosendo Gros, M.D.   Dg Abd 2 Views  02/26/2012  *RADIOLOGY REPORT*  Clinical Data: Partial small bowel obstruction.  ABDOMEN - 2 VIEW  Comparison: CT of abdomen and pelvis dated 02/25/2012.  Findings: No visible dilated small bowel loops by radiography. Oral contrast ingested yesterday for CT shows interval transit with some contrast remaining in the colon.  There is no evidence of free air.  No abnormal calcifications.  Hernia mesh and lumbar fusion hardware identified.  IMPRESSION: No radiographic evidence of residual small bowel obstruction.  Original Report Authenticated By: Reola Calkins, M.D.    Anti-infectives: Anti-infectives    None      Assessment/Plan: Acute blood loss anemia due to GI bleed  No evidence of obstruction. Pt having BMs. Contrast has progressed to colon. Suspect mild ileus due to GI bleed  Having EGD later today.  Maintain type and cross Serial hgb protonix iv No nsaids  No acute surgical issue identified. Signing off. pls call if needed.   Mary Sella. Andrey Campanile, MD, FACS General, Bariatric, & Minimally Invasive Surgery [facility], [place]     LOS: 1 day    Atilano Ina 02/26/2012

## 2012-02-26 NOTE — Op Note (Signed)
[facility] [address] [place], [place]  [address]  ENDOSCOPY PROCEDURE REPORT  PATIENT:  Heather, [name]  MR#:  [id number] BIRTHDATE:  1961-12-06, 50 yrs. old  GENDER:  female  ENDOSCOPIST:  Vida Rigger, MD Referred by:  hospitalist  PROCEDURE DATE:  02/26/2012 PROCEDURE:  egd bx ASA CLASS:  2 INDICATIONS:  melena  meds:136mcg fentanyl 10 versed  TOPICAL ANESTHETIC:used  DESCRIPTION OF PROCEDURE:   After the risks benefits and alternatives of the procedure were thoroughly explained, informed consent was obtained.  The Pentax Gastroscope [id number] endoscope was introduced through the mouth and advanced to the jej, without limitations.  The instrument was slowly withdrawn as the mucosa was fully examined.the findings are recorded below -pt tolerated proced well -no obvious complic <<PROCEDUREIMAGES>>  XBJYNWGN:5-?AOZH short segm barretts 2- s/p hh surg 3-2 antal ulcers w/ flat white bases unable to make bleed w/ washing 4-otherwise wnl egd to proxv jej w/out heme s/p ant/fundic bx to r/out hpylori  COMPLICATIONS:  none  ENDOSCOPIC IMPRESSION:above  RECOMMENDATIONS: await bx -no asa/nsaid recheck egd 2-3 mo-continue pump inhib ?mri/mrcp electively vs eus per dr Dulce Sellar  REPEAT EXAM:  2-3 mo  ______________________________ Vida Rigger, MD  CC:  n. eSIGNEDVida Rigger at 02/26/2012 01:56 PM  Linnell Fulling, [phone]

## 2012-02-26 NOTE — ED Provider Notes (Signed)
Medical screening examination/treatment/procedure(s) were conducted as a shared visit with non-physician practitioner(s) and myself.  I personally evaluated the patient during the encounter  C/O diffuse abd pain, chest pain, tarry and black stools. +diffuse ttp > epigastric. Heme pos. CT with possible partial SBO but surgery does not agree with read. Admitted to medicine for further w/u and evaluation of abd pain, GI  bleed  Forbes Cellar, MD 02/26/12 1454

## 2012-02-27 ENCOUNTER — Encounter (HOSPITAL_COMMUNITY): Payer: Self-pay | Admitting: Gastroenterology

## 2012-02-27 LAB — BASIC METABOLIC PANEL
BUN: 10 mg/dL (ref 6–23)
Calcium: 8.4 mg/dL (ref 8.4–10.5)
Creatinine, Ser: 0.7 mg/dL (ref 0.50–1.10)
GFR calc Af Amer: >90 mL/min (ref >90)
GFR calc non Af Amer: >90 mL/min (ref >90)

## 2012-02-27 LAB — CBC
HCT: 22.3 % — ABNORMAL LOW (ref 36.0–46.0)
MCH: 31 pg (ref 26.0–34.0)
MCHC: 35.4 g/dL (ref 30.0–36.0)
Platelets: 203 10*3/uL (ref 150–400)
Platelets: 201 10*3/uL (ref 150–400)
RBC: 2.52 MIL/uL — ABNORMAL LOW (ref 3.87–5.11)
RDW: 13.9 % (ref 11.5–15.5)
WBC: 11.4 10*3/uL — ABNORMAL HIGH (ref 4.0–10.5)
WBC: 10.2 10*3/uL (ref 4.0–10.5)

## 2012-02-27 MED ORDER — HYDROMORPHONE HCL PF 1 MG/ML IJ SOLN
1.0000 mg | INTRAMUSCULAR | Status: DC | PRN
  Administered 2012-02-27 – 2012-02-28 (×17): 1 mg via INTRAVENOUS
  Filled 2012-02-27 (×17): qty 1

## 2012-02-27 MED ORDER — HYDROCODONE-ACETAMINOPHEN 5-325 MG PO TABS
2.0000 | ORAL_TABLET | Freq: Four times a day (QID) | ORAL | Status: DC | PRN
  Administered 2012-02-27 – 2012-02-28 (×4): 2 via ORAL
  Filled 2012-02-27 (×4): qty 2

## 2012-02-27 NOTE — Progress Notes (Signed)
UR complete 

## 2012-02-27 NOTE — Progress Notes (Signed)
1 Day Post-Op  Subjective: Still having some epigastric discomfort and dark stools. Objective: Vital signs in last 24 hours: Temp:  [98.4 F (36.9 C)-99.1 F (37.3 C)] 99 F (37.2 C) (03/11 0547) Pulse Rate:  [94-105] 99  (03/11 0547) Resp:  [14-23] 18  (03/11 0547) BP: (97-133)/(58-79) 97/58 mmHg (03/11 0547) SpO2:  [94 %-100 %] 94 % (03/11 0547) Last BM Date: 02/26/12  Intake/Output from previous day: 03/10 0701 - 03/11 0700 In: 814.6 [P.O.:480; I.V.:20; Blood:314.6] Out: 900 [Urine:900] Intake/Output this shift:    PE: Abd-soft, nontender  Lab Results:   Basename 02/27/12 0456 02/27/12 0050  WBC 10.2 11.4*  HGB 7.9* 7.8*  HCT 22.3* 22.1*  PLT 201 203   BMET  Basename 02/27/12 0456 02/26/12 0732  NA 135 133*  K 3.5 3.6  CL 104 103  CO2 27 24  GLUCOSE 99 112*  BUN 10 28*  CREATININE 0.70 0.67  CALCIUM 8.4 7.6*   PT/INR  Basename 02/26/12 0732  LABPROT 14.6  INR 1.12   Comprehensive Metabolic Panel:    Component Value Date/Time   NA 135 02/27/2012 0456   K 3.5 02/27/2012 0456   CL 104 02/27/2012 0456   CO2 27 02/27/2012 0456   BUN 10 02/27/2012 0456   CREATININE 0.70 02/27/2012 0456   GLUCOSE 99 02/27/2012 0456   CALCIUM 8.4 02/27/2012 0456   AST 9 02/26/2012 0732   ALT 10 02/26/2012 0732   ALKPHOS 37* 02/26/2012 0732   BILITOT 0.2* 02/26/2012 0732   PROT 4.7* 02/26/2012 0732   ALBUMIN 2.8* 02/26/2012 0732     Studies/Results: Ct Abdomen Pelvis W Contrast  02/25/2012  *RADIOLOGY REPORT*  Clinical Data: Abdominal pain.  Nausea and vomiting.  Previous appendectomy and hysterectomy.  Previous hernia repair.  CT ABDOMEN AND PELVIS WITH CONTRAST  Technique:  Multidetector CT imaging of the abdomen and pelvis was performed following the standard protocol during bolus administration of intravenous contrast.  Contrast: OMNIPAQUE IOHEXOL 300 MG/ML IJ SOLN  Comparison: 11/30/2010  Findings: there is scarring at the lung bases.  No active process evident.  No  pleural or pericardial fluid.  The liver has a normal appearance without focal lesions or biliary ductal dilatation.  No calcified gallstones.  The spleen is normal. There are surgical clips in the splenic hilum region.   The pancreatic duct is slightly prominent.  There is low density in the pancreatic head near the ampulla.  I cannot rule out the possibility of a pancreatic mass in the early stages.  This could be approached either with endoscopy to visualize the ampulla area or MRI of the abdomen or both.  The pancreatic duct was prominent previously, but the appearance of the pancreatic head and ampulla is different.  The adrenal glands are normal.  The kidneys are normal except for 4 mm cyst in the lower pole left kidney.  No mass, stone or hydronephrosis.  The aorta shows mild atherosclerotic change but no aneurysm.  The IVC is normal.  No retroperitoneal mass or adenopathy.  No free intraperitoneal fluid or air.  There is mild dilatation of the small intestine with fluid and air. This raises the possibility of a partial small bowel obstruction. As noted previously, there appears of intestinal malrotation with the colon entirely located on the left in the small bowel primarily located on the right.  There is a surgical clip in the left lower quadrant that I think relates to previous appendectomy.  There is been previous fusion  of L5-S1.  No acute bony finding.  IMPRESSION: Partial small bowel obstruction.  Note that there is malrotation with the colon being entirely located on the left.  Chronic dilatation of the pancreatic duct.  Newly seen low density in the region of the pancreatic head and prominence of the ampulla. The possibility of a pancreatic head and or ampullary mass does exist.  I suggest endoscopy to evaluate the ampullary region, possibly in combination with MRI to evaluate the pancreatic head.  Original Report Authenticated By: Thomasenia Sales, M.D.   Dg Chest Portable 1 View  02/25/2012   *RADIOLOGY REPORT*  Clinical Data: 51 year old female with cough.  History of previous abdominal surgery, Barrett's esophagus and nissen fundoplication.  PORTABLE CHEST - 1 VIEW  Comparison: 11/30/2010  Findings: The cardiomediastinal silhouette is unremarkable. The lungs are clear. There is no evidence of focal airspace disease, pulmonary edema, suspicious pulmonary nodule/mass, pleural effusion, or pneumothorax. No acute bony abnormalities are identified. Remote right-sided rib fractures are present. There is no definite pneumoperitoneum on this upright film.  IMPRESSION: No evidence of acute abnormality.  Original Report Authenticated By: Rosendo Gros, M.D.   Dg Abd 2 Views  02/26/2012  *RADIOLOGY REPORT*  Clinical Data: Partial small bowel obstruction.  ABDOMEN - 2 VIEW  Comparison: CT of abdomen and pelvis dated 02/25/2012.  Findings: No visible dilated small bowel loops by radiography. Oral contrast ingested yesterday for CT shows interval transit with some contrast remaining in the colon.  There is no evidence of free air.  No abnormal calcifications.  Hernia mesh and lumbar fusion hardware identified.  IMPRESSION: No radiographic evidence of residual small bowel obstruction.  Original Report Authenticated By: Reola Calkins, M.D.    Anti-infectives: Anti-infectives    None      Assessment Principal Problem:  *GI bleed-antral ulcers found on EGD which may be the source.    LOS: 2 days   Plan: Will monitor with you.  Need acute surgical intervention needed today.   Heather [name] 02/27/2012

## 2012-02-27 NOTE — Progress Notes (Signed)
Subjective: Some persistent epigastric discomfort. No blood in stool and no bowel movement since yesterday afternoon.  Objective: Vital signs in last 24 hours: Temp:  [98.4 F (36.9 C)-99.1 F (37.3 C)] 99 F (37.2 C) (03/11 0547) Pulse Rate:  [94-105] 99  (03/11 0547) Resp:  [14-23] 18  (03/11 0547) BP: (97-133)/(58-79) 97/58 mmHg (03/11 0547) SpO2:  [94 %-100 %] 94 % (03/11 0547) Weight change:  Last BM Date: 02/26/12  PE: GEN:  Non-toxic appearing HEENT:  Conjunctival pallor ABD:  Soft, mild epigastric tenderness, active bowel sounds  Lab Results: CBC    Component Value Date/Time   WBC 10.2 02/27/2012 0456   RBC 2.54* 02/27/2012 0456   HGB 7.9* 02/27/2012 0456   HCT 22.3* 02/27/2012 0456   PLT 201 02/27/2012 0456   MCV 87.8 02/27/2012 0456   MCH 31.1 02/27/2012 0456   MCHC 35.4 02/27/2012 0456   RDW 13.9 02/27/2012 0456   LYMPHSABS 3.3 02/25/2012 1830   MONOABS 0.8 02/25/2012 1830   EOSABS 0.0 02/25/2012 1830   BASOSABS 0.0 02/25/2012 1830   Assessment:  1.  Gastric ulcers, clean-based, on endoscopy yesterday.  Etiology NSAID versus H. Pylori. 2.  Melena and acute blood loss anemia, improved, secondary #1 above. 3.  Epigastric abdominal pain.  Likely due to antral ulcers. 4.  Abnormal CT pancreas + pancreatic duct.  Unclear etiology. 5.  Congenital small and large bowel malrotation.  Plan:  1.  Continue Protonix 40 mg bid x 6 weeks. 2.  Awaiting gastric biopsies to evaluate for H. Pylori. 3.  Advance diet. 4.  Outpatient evaluation of pancreatic abnormalities, MRI/MRCP +/- EUS. 5.  Hopefully home tomorrow, if hemoglobin stable and no further bleeding. 6.  Will follow.   Heather [name] 02/27/2012, 10:19 AM

## 2012-02-27 NOTE — Progress Notes (Signed)
PROGRESS NOTE  Heather [name] AVW:[id number] DOB: January 16, 1961 DOA: 02/25/2012 PCP: Jessie Foot, RN, Nursing/RN Coord  Brief narrative: 51 year old woman presented to the emergency department with abdominal cramping and multiple episodes of black diarrhea. Symptoms were associated with lightheadedness and mild tightness in center chest. Positive sick contact. Rare use of NSAIDs.  Past medical history: Barrett's esophagus, abdominal surgery for malrotation, appendectomy, ventral hernia repair, abdominal hysterectomy, Nissen fundoplication, chronic back pain, hypertension  Consultants:  General surgery  Gastroenterology  Procedures:  March 10: Upper endoscopy: Antral ulcers, possible Barrett's, hiatal hernia.  March 10: 1 unit packed red blood cell transfusion.  Interim History: Interval documentation reviewed. Status post 1 unit packed red blood cells.  Subjective: Still has epigastric and upper abdominal pain. No further bleeding.  Objective: Filed Vitals:   02/26/12 2135 02/26/12 2208 02/26/12 2300 02/27/12 0547  BP: 111/71 103/64 127/77 97/58  Pulse: 102 101 105 99  Temp: 98.8 F (37.1 C) 98.6 F (37 C) 99.1 F (37.3 C) 99 F (37.2 C)  TempSrc: Oral Oral Oral Oral  Resp: 16 16 18 18   Height:      Weight:      SpO2:  97%  94%    Intake/Output Summary (Last 24 hours) at 02/27/12 1130 Last data filed at 02/27/12 0911  Gross per 24 hour  Intake 814.58 ml  Output   1025 ml  Net -210.42 ml    Exam:   General:  Appears calm and mildly uncomfortable. Nontoxic.  Cardiovascular: Regular rate and rhythm. No murmur, rub, gallop. No lower extremity edema.  Telemetry: Sinus rhythm. No acute changes.  Respiratory: Clear to auscultation bilaterally. No wheezes, rales, rhonchi. Respiratory effort.    Data Reviewed: Basic Metabolic Panel:  Lab 02/27/12 8295 02/26/12 0732 02/25/12 1545  NA 135 133* 132*  K 3.5 3.6 --  CL 104 103 99  CO2 27 24 22   GLUCOSE 99  112* 160*  BUN 10 28* 47*  CREATININE 0.70 0.67 0.73  CALCIUM 8.4 7.6* 9.5  MG -- -- --  PHOS -- -- --   Liver Function Tests:  Lab 02/26/12 0732 02/25/12 1545  AST 9 11  ALT 10 14  ALKPHOS 37* 54  BILITOT 0.2* 0.3  PROT 4.7* 6.3  ALBUMIN 2.8* 3.8    Lab 02/25/12 1545  LIPASE 14  AMYLASE --   CBC:  Lab 02/27/12 0456 02/27/12 0050 02/26/12 1820 02/26/12 1220 02/26/12 0732 02/25/12 1830  WBC 10.2 11.4* 8.1 8.1 9.2 --  NEUTROABS -- -- -- -- -- 12.3*  HGB 7.9* 7.8* 6.8* 7.3* 7.7* --  HCT 22.3* 22.1* 19.4* 20.3* 21.3* --  MCV 87.8 87.7 89.8 89.4 89.1 --  PLT 201 203 203 209 237 --   Studies: 02/26/2012 Clinical Data: Partial small bowel obstruction.  ABDOMEN - 2 VIEW  Comparison: CT of abdomen and pelvis dated 02/25/2012.  Findings: No visible dilated small bowel loops by radiography.  Oral contrast ingested yesterday for CT shows interval transit with some contrast remaining in the colon. There is no evidence of free air. No abnormal calcifications. Hernia mesh and lumbar fusion hardware identified.  IMPRESSION: No radiographic evidence of residual small bowel obstruction.  Original Report Authenticated By: Reola Calkins, M.D.   Ct Abdomen Pelvis W Contrast  02/25/2012  *RADIOLOGY REPORT*  Clinical Data: Abdominal pain.  Nausea and vomiting.  Previous appendectomy and hysterectomy.  Previous hernia repair.  CT ABDOMEN AND PELVIS WITH CONTRAST  Technique:  Multidetector CT imaging of  the abdomen and pelvis was performed following the standard protocol during bolus administration of intravenous contrast.  Contrast: OMNIPAQUE IOHEXOL 300 MG/ML IJ SOLN  Comparison: 11/30/2010  Findings: there is scarring at the lung bases.  No active process evident.  No pleural or pericardial fluid.  The liver has a normal appearance without focal lesions or biliary ductal dilatation.  No calcified gallstones.  The spleen is normal. There are surgical clips in the splenic hilum region.    The pancreatic duct is slightly prominent.  There is low density in the pancreatic head near the ampulla.  I cannot rule out the possibility of a pancreatic mass in the early stages.  This could be approached either with endoscopy to visualize the ampulla area or MRI of the abdomen or both.  The pancreatic duct was prominent previously, but the appearance of the pancreatic head and ampulla is different.  The adrenal glands are normal.  The kidneys are normal except for 4 mm cyst in the lower pole left kidney.  No mass, stone or hydronephrosis.  The aorta shows mild atherosclerotic change but no aneurysm.  The IVC is normal.  No retroperitoneal mass or adenopathy.  No free intraperitoneal fluid or air.  There is mild dilatation of the small intestine with fluid and air. This raises the possibility of a partial small bowel obstruction. As noted previously, there appears of intestinal malrotation with the colon entirely located on the left in the small bowel primarily located on the right.  There is a surgical clip in the left lower quadrant that I think relates to previous appendectomy.  There is been previous fusion of L5-S1.  No acute bony finding.  IMPRESSION: Partial small bowel obstruction.  Note that there is malrotation with the colon being entirely located on the left.  Chronic dilatation of the pancreatic duct.  Newly seen low density in the region of the pancreatic head and prominence of the ampulla. The possibility of a pancreatic head and or ampullary mass does exist.  I suggest endoscopy to evaluate the ampullary region, possibly in combination with MRI to evaluate the pancreatic head.  Original Report Authenticated By: Thomasenia Sales, M.D.   Dg Chest Portable 1 View  02/25/2012  *RADIOLOGY REPORT*  Clinical Data: 51 year old female with cough.  History of previous abdominal surgery, Barrett's esophagus and nissen fundoplication.  PORTABLE CHEST - 1 VIEW  Comparison: 11/30/2010  Findings: The  cardiomediastinal silhouette is unremarkable. The lungs are clear. There is no evidence of focal airspace disease, pulmonary edema, suspicious pulmonary nodule/mass, pleural effusion, or pneumothorax. No acute bony abnormalities are identified. Remote right-sided rib fractures are present. There is no definite pneumoperitoneum on this upright film.  IMPRESSION: No evidence of acute abnormality.  Original Report Authenticated By: Rosendo Gros, M.D.    Scheduled Meds:    . influenza  inactive virus vaccine  0.5 mL Intramuscular Tomorrow-1000  . pantoprazole  40 mg Oral BID AC  . pneumococcal 23 valent vaccine  0.5 mL Intramuscular Tomorrow-1000  . DISCONTD: pantoprazole  40 mg Oral BID AC   Continuous Infusions:    . DISCONTD: sodium chloride 50 mL/hr at 02/26/12 0923  . DISCONTD: pantoprozole (PROTONIX) infusion 8 mg/hr (02/26/12 0057)    EKG independently reviewed: Sinus tachycardia.   Assessment/Plan: 1. Abdominal pain/nausea/vomiting: Positive sick contact. Nausea and vomiting resolved. Most likely gastroenteritis. Pain likely related to ulcers. 2. Melena/GI bleed/gastric ulcers: Upper GI bleed suspected given history and elevated BUN. No further bleeding. Etiology  NSAID versus H. pylori. Protonix 40 mg by mouth twice a day. Followup gastric biopsies to rule out H. pylori. 3. Acute blood loss anemia: Status post 1 unit packed red blood cells. Continue to follow. 4. Hyponatremia: Resolved. Likely secondary to dehydration. 5. Radiographic small bowel obstruction: Per surgery this is felt to be ileus. Repeat imaging showed no evidence of obstruction.  6. Dilated pancreatic duct with possible ampulla or pancreatic head mass: Outpatient MRI to further define. Dilatation of the pancreatic duct is chronic. 7. History of intestinal malrotation: Appears stable at this point. Appreciate surgical consultation and recommendations.  Repeat CBC in the morning. This is stable we will plan discharge  home. Discussed at bedside with patient and daughter. All questions answered to their apparent satisfaction.  Code Status: Full code Family Communication: daughter Carollee Herter [phone] Disposition Plan: Pending further evaluation and treatment   Brendia Sacks, MD  [facility] Pager [phone] 02/27/2012, 11:30 AM    LOS: 2 days

## 2012-02-28 LAB — CBC
HCT: 23.9 % — ABNORMAL LOW (ref 36.0–46.0)
RBC: 2.7 MIL/uL — ABNORMAL LOW (ref 3.87–5.11)
RDW: 13.8 % (ref 11.5–15.5)
WBC: 8.8 10*3/uL (ref 4.0–10.5)

## 2012-02-28 MED ORDER — SODIUM CHLORIDE 0.9 % IV SOLN: Freq: Once | INTRAVENOUS | Status: DC

## 2012-02-28 MED ORDER — TRAMADOL HCL 50 MG PO TABS: 50.0000 mg | ORAL_TABLET | Freq: Two times a day (BID) | ORAL | Status: DC | PRN

## 2012-02-28 MED ORDER — TRAMADOL HCL 50 MG PO TABS: 50.0000 mg | ORAL_TABLET | Freq: Two times a day (BID) | ORAL | Status: AC | PRN

## 2012-02-28 MED ORDER — OMEPRAZOLE 20 MG PO CPDR: 20.0000 mg | DELAYED_RELEASE_CAPSULE | Freq: Two times a day (BID) | ORAL | Status: DC

## 2012-02-28 NOTE — Progress Notes (Signed)
2 Days Post-Op  Subjective: Afebrile, VSS, H/H stable,  Frustrated and wants to know if she can go home.    Objective: Vital signs in last 24 hours: Temp:  [98.6 F (37 C)-98.8 F (37.1 C)] 98.6 F (37 C) (03/12 0545) Pulse Rate:  [86-90] 90  (03/12 0545) Resp:  [16-20] 16  (03/12 0545) BP: (106-118)/(67-74) 109/68 mmHg (03/12 0545) SpO2:  [93 %-95 %] 93 % (03/12 0545) Last BM Date: 02/26/12  Intake/Output from previous day: 03/11 0701 - 03/12 0700 In: 720 [P.O.:720] Out: 125 [Urine:125] Intake/Output this shift:    PE:  Alert, NAD Chest clear Abd:  Soft, tender, still having some discomfort,  Less blood in stool.  Lab Results:   [facility] 02/28/12 0455 02/27/12 0456  WBC 8.8 10.2  HGB 8.4* 7.9*  HCT 23.9* 22.3*  PLT 233 201    BMET  Basename 02/27/12 0456 02/26/12 0732  NA 135 133*  K 3.5 3.6  CL 104 103  CO2 27 24  GLUCOSE 99 112*  BUN 10 28*  CREATININE 0.70 0.67  CALCIUM 8.4 7.6*   PT/INR  Basename 02/26/12 0732  LABPROT 14.6  INR 1.12     Studies/Results: No results found.  Anti-infectives: Anti-infectives    None     Current Facility-Administered Medications  Medication Dose Route Frequency Provider Last Rate Last Dose  . 0.9 %  sodium chloride infusion   Intravenous Once Freddy Jaksch, MD      . acetaminophen (TYLENOL) tablet 650 mg  650 mg Oral Q6H PRN Kathlen Mody, MD   650 mg at 02/27/12 1203   Or  . acetaminophen (TYLENOL) suppository 650 mg  650 mg Rectal Q6H PRN Kathlen Mody, MD      . HYDROcodone-acetaminophen (NORCO) 5-325 MG per tablet 2 tablet  2 tablet Oral Q6H PRN Standley Brooking, MD   2 tablet at 02/28/12 0734  . HYDROmorphone (DILAUDID) injection 1 mg  1 mg Intravenous Q2H PRN Jinger Neighbors, NP   1 mg at 02/28/12 0950  . LORazepam (ATIVAN) tablet 0.5 mg  0.5 mg Oral Q6H PRN Jinger Neighbors, NP   0.5 mg at 02/28/12 0734  . ondansetron (ZOFRAN) injection 4 mg  4 mg Intravenous Q6H PRN Standley Brooking, MD   4 mg at 02/28/12  0439  . pantoprazole (PROTONIX) EC tablet 40 mg  40 mg Oral BID AC Petra Kuba, MD   40 mg at 02/28/12 0815  . zolpidem (AMBIEN) tablet 5 mg  5 mg Oral QHS PRN Jinger Neighbors, NP   5 mg at 02/28/12 0216    Assessment/Plan 1. Epigastric abdominal pain. Likely due to gastric ulcers.  2. Gastric ulcers, clean-based.  3. Melena with acute blood loss anemia, improving.  4. 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  5. Chronic small and large bowel malrotation.  Plan:  Dr. Dulce Sellar has said she could go from his standpoint, some confusion about a procedure today, nurse is trying to work out.  We will be available if you need [place]. She currently has no surgical need.  LOS: 3 days    Heather [name] 02/28/2012

## 2012-02-28 NOTE — Discharge Summary (Addendum)
Physician Discharge Summary  Heather [name] ZOX:[id number] DOB: 25-Mar-1961 DOA: 02/25/2012  PCP: Jessie Foot, RN, Nursing/RN Coord Gastroenterologist: Willis Modena, MD  Admit date: 02/25/2012 Discharge date: 02/28/2012  Discharge Diagnoses:  1. Abdominal pain, nausea, vomiting; resolved. 2. Melena/GI bleed secondary to gastric ulcers 3. Acute blood loss anemia 4. Hyponatremia, resolved 5. 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 Symptoms were associated with lightheadedness and mild tightness in center chest. Positive sick contact. Rare use of NSAIDs.  Hospital Course:  Ms. Charon was admitted and treated with supportive care. She had no further bleeding and she was seen consultation gastroenterology. She underwent an upper endoscopy with results as below. Her bleeding was thought to be secondary to gastric ulcers. PPI therapy for 6 weeks was recommended as well as close outpatient followup with gastroenterology. Etiology of her pain is thought to be related to her ulcers. Her nausea and vomiting has resolved and was most likely a viral gastroenteritis. Because of abnormal imaging suggestive of small bowel obstruction she was seen by Gen. surgery. However the patient's symptoms quickly resolved and is felt that her symptoms most likely represented an ileus. Dilated pancreatic duct with possible mass: recommendations as below.  1. Abdominal pain/nausea/vomiting: Resolved. Positive sick contact. Most likely gastroenteritis. Pain likely related to ulcers.  2. Melena/GI bleed/gastric ulcers: Upper GI bleed. No further bleeding. Etiology NSAID versus H. pylori. PPI by mouth twice a day. Followup gastric biopsies to rule out H. pylori. Followup with  gastroenterology as an outpatient.  3. Acute blood loss anemia: Status post 1 unit packed red blood cells. Stable. 4. Hyponatremia: Resolved. Likely secondary to dehydration.  5. Radiographic small bowel obstruction: Per surgery this was felt to be an ileus. Repeat imaging showed no evidence of obstruction.    6. Dilated pancreatic duct with possible ampulla or pancreatic head mass: Outpatient MRI to further define. Dilatation of the pancreatic duct is chronic.  7. History of intestinal malrotation: Appears stable at this point. Appreciate surgical consultation and recommendations.  Consultants:  General surgery   Gastroenterology  Procedures:  March 10: Upper endoscopy: Antral ulcers, possible Barrett's, hiatal hernia.   March 10: 1 unit packed red blood cell transfusion.  Discharge Instructions  Discharge Orders    Future Orders Please Complete By Expires   Diet general      Discharge instructions      Comments:   Call physician for bleeding.   Activity as tolerated - No restrictions        Medication List  As of 02/28/2012  4:19 PM   STOP taking these medications         lisinopril 10 MG tablet         TAKE these medications         omeprazole 20 MG capsule   Commonly known as: PRILOSEC   Take 1 capsule (20 mg total) by mouth 2 (two) times daily.      ondansetron 8 MG tablet   Commonly known as: ZOFRAN   Take 8 mg by mouth every 8 (eight) hours as needed. For nausea      traMADol 50 MG tablet   Commonly known as: ULTRAM   Take 1 tablet (50 mg total) by mouth every 12 (twelve) hours as needed.  The patient has iron supplements at home and she will start taking. She is aware that this will turn stools black. Follow-up Information    Follow up with Stalling, Smith Robert, RN in 1 week.   Contact information:   [address] [place] [address] [phone]       Follow up with Freddy Jaksch, MD. Schedule an appointment as soon as possible for a visit  in 1 week.   Contact information:   [address] [place] [address] [phone]           The results of significant diagnostics from this hospitalization (including imaging, microbiology, ancillary and laboratory) are listed below for reference.    Significant Diagnostic Studies: Ct Abdomen Pelvis W Contrast  02/25/2012  *RADIOLOGY REPORT*  Clinical Data: Abdominal pain.  Nausea and vomiting.  Previous appendectomy and hysterectomy.  Previous hernia repair.  CT ABDOMEN AND PELVIS WITH CONTRAST  Technique:  Multidetector CT imaging of the abdomen and pelvis was performed following the standard protocol during bolus administration of intravenous contrast.  Contrast: OMNIPAQUE IOHEXOL 300 MG/ML IJ SOLN  Comparison: 11/30/2010  Findings: there is scarring at the lung bases.  No active process evident.  No pleural or pericardial fluid.  The liver has a normal appearance without focal lesions or biliary ductal dilatation.  No calcified gallstones.  The spleen is normal. There are surgical clips in the splenic hilum region.   The pancreatic duct is slightly prominent.  There is low density in the pancreatic head near the ampulla.  I cannot rule out the possibility of a pancreatic mass in the early stages.  This could be approached either with endoscopy to visualize the ampulla area or MRI of the abdomen or both.  The pancreatic duct was prominent previously, but the appearance of the pancreatic head and ampulla is different.  The adrenal glands are normal.  The kidneys are normal except for 4 mm cyst in the lower pole left kidney.  No mass, stone or hydronephrosis.  The aorta shows mild atherosclerotic change but no aneurysm.  The IVC is normal.  No retroperitoneal mass or adenopathy.  No free intraperitoneal fluid or air.  There is mild dilatation of the small intestine with fluid and air. This raises the possibility of a partial small bowel obstruction. As noted  previously, there appears of intestinal malrotation with the colon entirely located on the left in the small bowel primarily located on the right.  There is a surgical clip in the left lower quadrant that I think relates to previous appendectomy.  There is been previous fusion of L5-S1.  No acute bony finding.  IMPRESSION: Partial small bowel obstruction.  Note that there is malrotation with the colon being entirely located on the left.  Chronic dilatation of the pancreatic duct.  Newly seen low density in the region of the pancreatic head and prominence of the ampulla. The possibility of a pancreatic head and or ampullary mass does exist.  I suggest endoscopy to evaluate the ampullary region, possibly in combination with MRI to evaluate the pancreatic head.  Original Report Authenticated By: Thomasenia Sales, M.D.   Dg Chest Portable 1 View  02/25/2012  *RADIOLOGY REPORT*  Clinical Data: 50 year old female with cough.  History of previous abdominal surgery, Barrett's esophagus and nissen fundoplication.  PORTABLE CHEST - 1 VIEW  Comparison: 11/30/2010  Findings: The cardiomediastinal silhouette is unremarkable. The lungs are clear. There is no evidence of focal  airspace disease, pulmonary edema, suspicious pulmonary nodule/mass, pleural effusion, or pneumothorax. No acute bony abnormalities are identified. Remote right-sided rib fractures are present. There is no definite pneumoperitoneum on this upright film.  IMPRESSION: No evidence of acute abnormality.  Original Report Authenticated By: Rosendo Gros, M.D.   Dg Abd 2 Views  02/26/2012  *RADIOLOGY REPORT*  Clinical Data: Partial small bowel obstruction.  ABDOMEN - 2 VIEW  Comparison: CT of abdomen and pelvis dated 02/25/2012.  Findings: No visible dilated small bowel loops by radiography. Oral contrast ingested yesterday for CT shows interval transit with some contrast remaining in the colon.  There is no evidence of free air.  No abnormal calcifications.   Hernia mesh and lumbar fusion hardware identified.  IMPRESSION: No radiographic evidence of residual small bowel obstruction.  Original Report Authenticated By: Reola Calkins, M.D.    Microbiology: Recent Results (from the past 240 hour(s))  STOOL CULTURE     Status: Normal (Preliminary result)   Collection Time   02/26/12 10:13 AM      Component Value Range Status Comment   Specimen Description STOOL   Final    Special Requests NONE   Final    Culture NO SUSPICIOUS COLONIES, CONTINUING TO HOLD   Final    Report Status PENDING   Incomplete   CLOSTRIDIUM DIFFICILE BY PCR     Status: Normal   Collection Time   02/26/12 10:13 AM      Component Value Range Status Comment   C difficile by pcr NEGATIVE  NEGATIVE  Final      Labs: Basic Metabolic Panel:  Lab 02/27/12 9563 02/26/12 0732 02/25/12 1545  NA 135 133* 132*  K 3.5 3.6 --  CL 104 103 99  CO2 27 24 22   GLUCOSE 99 112* 160*  BUN 10 28* 47*  CREATININE 0.70 0.67 0.73  CALCIUM 8.4 7.6* 9.5  MG -- -- --  PHOS -- -- --   Liver Function Tests:  Lab 02/26/12 0732 02/25/12 1545  AST 9 11  ALT 10 14  ALKPHOS 37* 54  BILITOT 0.2* 0.3  PROT 4.7* 6.3  ALBUMIN 2.8* 3.8    Lab 02/25/12 1545  LIPASE 14  AMYLASE --   CBC:  Lab 02/28/12 0455 02/27/12 0456 02/27/12 0050 02/26/12 1820 02/26/12 1220 02/25/12 1830  WBC 8.8 10.2 11.4* 8.1 8.1 --  NEUTROABS -- -- -- -- -- 12.3*  HGB 8.4* 7.9* 7.8* 6.8* 7.3* --  HCT 23.9* 22.3* 22.1* 19.4* 20.3* --  MCV 88.5 87.8 87.7 89.8 89.4 --  PLT 233 201 203 203 209 --    Time coordinating discharge: 25 minutes.  Signed:  Brendia Sacks, MD  [facility] 02/28/2012, 4:19 PM

## 2012-02-28 NOTE — Progress Notes (Signed)
PROGRESS NOTE  Heather [name]:[id number] DOB: [birth year] DOA: 02/25/2012 PCP: Jessie Foot, RN, Nursing/RN Coord  Brief narrative: 51 year old woman presented to the emergency department with abdominal cramping and multiple episodes of black diarrhea. Symptoms were associated with lightheadedness and mild tightness in center chest. Positive sick contact. Rare use of NSAIDs.  Past medical history: Barrett's esophagus, abdominal surgery for malrotation, appendectomy, ventral hernia repair, abdominal hysterectomy, Nissen fundoplication, chronic back pain, hypertension  Consultants:  General surgery  Gastroenterology  Procedures:  March 10: Upper endoscopy: Antral ulcers, possible Barrett's, hiatal hernia.  March 10: 1 unit packed red blood cell transfusion.  Interim History: Interval documentation reviewed.  Subjective: Better. Ready go home.  Objective: Filed Vitals:   02/27/12 0547 02/27/12 1416 02/27/12 2237 02/28/12 0545  BP: 97/58 118/74 106/67 109/68  Pulse: 99 87 86 90  Temp: 99 F (37.2 C) 98.8 F (37.1 C) 98.7 F (37.1 C) 98.6 F (37 C)  TempSrc: Oral Oral Oral Oral  Resp: 18 18 20 16   Height:      Weight:      SpO2: 94% 95% 95% 93%    Intake/Output Summary (Last 24 hours) at 02/28/12 1612 Last data filed at 02/28/12 1241  Gross per 24 hour  Intake    960 ml  Output      0 ml  Net    960 ml    Exam:   General:  Appears calm and mildly uncomfortable. Nontoxic.  Cardiovascular: Regular rate and rhythm. No murmur, rub, gallop. No lower extremity edema.  Telemetry: Sinus rhythm. No acute changes.  Respiratory: Clear to auscultation bilaterally. No wheezes, rales, rhonchi. Respiratory effort.  Data Reviewed: Basic Metabolic Panel:  Lab 02/27/12 8119 02/26/12 0732 02/25/12 1545  NA 135 133* 132*  K 3.5 3.6 --  CL 104 103 99  CO2 27 24 22   GLUCOSE 99 112* 160*  BUN 10 28* 47*  CREATININE 0.70 0.67 0.73  CALCIUM 8.4 7.6* 9.5  MG -- --  --  PHOS -- -- --   Liver Function Tests:  Lab 02/26/12 0732 02/25/12 1545  AST 9 11  ALT 10 14  ALKPHOS 37* 54  BILITOT 0.2* 0.3  PROT 4.7* 6.3  ALBUMIN 2.8* 3.8    Lab 02/25/12 1545  LIPASE 14  AMYLASE --   CBC:  Lab 02/28/12 0455 02/27/12 0456 02/27/12 0050 02/26/12 1820 02/26/12 1220 02/25/12 1830  WBC 8.8 10.2 11.4* 8.1 8.1 --  NEUTROABS -- -- -- -- -- 12.3*  HGB 8.4* 7.9* 7.8* 6.8* 7.3* --  HCT 23.9* 22.3* 22.1* 19.4* 20.3* --  MCV 88.5 87.8 87.7 89.8 89.4 --  PLT 233 201 203 203 209 --   Studies: 02/26/2012 Clinical Data: Partial small bowel obstruction.  ABDOMEN - 2 VIEW  Comparison: CT of abdomen and pelvis dated 02/25/2012.  Findings: No visible dilated small bowel loops by radiography.  Oral contrast ingested yesterday for CT shows interval transit with some contrast remaining in the colon. There is no evidence of free air. No abnormal calcifications. Hernia mesh and lumbar fusion hardware identified.  IMPRESSION: No radiographic evidence of residual small bowel obstruction.  Original Report Authenticated By: Reola Calkins, M.D.   Ct Abdomen Pelvis W Contrast  02/25/2012  *RADIOLOGY REPORT*  Clinical Data: Abdominal pain.  Nausea and vomiting.  Previous appendectomy and hysterectomy.  Previous hernia repair.  CT ABDOMEN AND PELVIS WITH CONTRAST  Technique:  Multidetector CT imaging of the abdomen and pelvis was performed following  the standard protocol during bolus administration of intravenous contrast.  Contrast: OMNIPAQUE IOHEXOL 300 MG/ML IJ SOLN  Comparison: 11/30/2010  Findings: there is scarring at the lung bases.  No active process evident.  No pleural or pericardial fluid.  The liver has a normal appearance without focal lesions or biliary ductal dilatation.  No calcified gallstones.  The spleen is normal. There are surgical clips in the splenic hilum region.   The pancreatic duct is slightly prominent.  There is low density in the pancreatic head  near the ampulla.  I cannot rule out the possibility of a pancreatic mass in the early stages.  This could be approached either with endoscopy to visualize the ampulla area or MRI of the abdomen or both.  The pancreatic duct was prominent previously, but the appearance of the pancreatic head and ampulla is different.  The adrenal glands are normal.  The kidneys are normal except for 4 mm cyst in the lower pole left kidney.  No mass, stone or hydronephrosis.  The aorta shows mild atherosclerotic change but no aneurysm.  The IVC is normal.  No retroperitoneal mass or adenopathy.  No free intraperitoneal fluid or air.  There is mild dilatation of the small intestine with fluid and air. This raises the possibility of a partial small bowel obstruction. As noted previously, there appears of intestinal malrotation with the colon entirely located on the left in the small bowel primarily located on the right.  There is a surgical clip in the left lower quadrant that I think relates to previous appendectomy.  There is been previous fusion of L5-S1.  No acute bony finding.  IMPRESSION: Partial small bowel obstruction.  Note that there is malrotation with the colon being entirely located on the left.  Chronic dilatation of the pancreatic duct.  Newly seen low density in the region of the pancreatic head and prominence of the ampulla. The possibility of a pancreatic head and or ampullary mass does exist.  I suggest endoscopy to evaluate the ampullary region, possibly in combination with MRI to evaluate the pancreatic head.  Original Report Authenticated By: Thomasenia Sales, M.D.   Dg Chest Portable 1 View  02/25/2012  *RADIOLOGY REPORT*  Clinical Data: 51 year old female with cough.  History of previous abdominal surgery, Barrett's esophagus and nissen fundoplication.  PORTABLE CHEST - 1 VIEW  Comparison: 11/30/2010  Findings: The cardiomediastinal silhouette is unremarkable. The lungs are clear. There is no evidence of focal  airspace disease, pulmonary edema, suspicious pulmonary nodule/mass, pleural effusion, or pneumothorax. No acute bony abnormalities are identified. Remote right-sided rib fractures are present. There is no definite pneumoperitoneum on this upright film.  IMPRESSION: No evidence of acute abnormality.  Original Report Authenticated By: Rosendo Gros, M.D.    Scheduled Meds:    . sodium chloride   Intravenous Once  . pantoprazole  40 mg Oral BID AC   Continuous Infusions:    EKG independently reviewed: Sinus tachycardia.   Assessment/Plan: 1. Abdominal pain/nausea/vomiting: Resolved. Positive sick contact. Most likely gastroenteritis. Pain likely related to ulcers. 2. Melena/GI bleed/gastric ulcers: Upper GI bleed. No further bleeding. Etiology NSAID versus H. pylori. PPI by mouth twice a day. Followup gastric biopsies to rule out H. pylori. Followup with gastroenterology as an outpatient. 3. Acute blood loss anemia: Status post 1 unit packed red blood cells. Continue to follow. 4. Hyponatremia: Resolved. Likely secondary to dehydration. 5. Radiographic small bowel obstruction: Per surgery this was felt to be an ileus. Repeat imaging  showed no evidence of obstruction.  6. Dilated pancreatic duct with possible ampulla or pancreatic head mass: Outpatient MRI to further define. Dilatation of the pancreatic duct is chronic. 7. History of intestinal malrotation: Appears stable at this point. Appreciate surgical consultation and recommendations.    Code Status: Full code Family Communication: daughter Carollee Herter [phone] Disposition Plan: Home today.   Brendia Sacks, MD  [facility] Pager [phone] 02/28/2012, 4:12 PM    LOS: 3 days

## 2012-02-28 NOTE — Progress Notes (Signed)
Spoke with patient at bedside. States plans to d/c home with dtr, independent of ADL's. Has PCP f/u in [place], sees Domenick Bookbinder at [facility] in [place]. Has spoken with CSW about financial resources. Feels she can afford meds at d/c. Eager for d/c home. No d/c needs at this time.

## 2012-02-28 NOTE — Progress Notes (Signed)
Subjective: Still having some epigastric pain. Tolerating diet; minimal melena; wants to go home.  Objective: Vital signs in last 24 hours: Temp:  [98.6 F (37 C)-98.8 F (37.1 C)] 98.6 F (37 C) (03/12 0545) Pulse Rate:  [86-90] 90  (03/12 0545) Resp:  [16-20] 16  (03/12 0545) BP: (106-118)/(67-74) 109/68 mmHg (03/12 0545) SpO2:  [93 %-95 %] 93 % (03/12 0545) Weight change:  Last BM Date: 02/26/12  PE: GEN:  NAD ABD:  Mild epigastric tenderness, + bowel sounds  Lab Results: CBC    Component Value Date/Time   WBC 8.8 02/28/2012 0455   RBC 2.70* 02/28/2012 0455   HGB 8.4* 02/28/2012 0455   HCT 23.9* 02/28/2012 0455   PLT 233 02/28/2012 0455   MCV 88.5 02/28/2012 0455   MCH 31.1 02/28/2012 0455   MCHC 35.1 02/28/2012 0455   RDW 13.8 02/28/2012 0455   LYMPHSABS 3.3 02/25/2012 1830   MONOABS 0.8 02/25/2012 1830   EOSABS 0.0 02/25/2012 1830   BASOSABS 0.0 02/25/2012 1830   Assessment:  1.  Epigastric abdominal pain.  Likely due to gastric ulcers. 2.  Gastric ulcers, clean-based. 3.  Melena with acute blood loss anemia, improving. 4.  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 5.  Chronic small and large bowel malrotation.  Plan:  1.  Continue PPI BID; patient would like more cost effective approach upon discharge; Prilosec OTC 20 mg po bid would be reasonable, and would need to continue for 6 week course of treatment. 2.  Still awaiting H. Pylori serologies. 3.  Avoid/minimize NSAIDs as clinically feasible. 4.  OK to discharge today from GI standpoint, if she can be put on manageable oral analgesic regimen. 5.  Will make arrangements for her to follow-up with [facility] in the next week or two. 6.  Will sign-off; please call with questions.   Freddy Jaksch 02/28/2012, 10:16 AM

## 2012-02-29 LAB — TYPE AND SCREEN
Antibody Screen: NEGATIVE
Unit division: 0

## 2012-02-29 LAB — STOOL CULTURE

## 2012-12-15 ENCOUNTER — Emergency Department (HOSPITAL_COMMUNITY)
Admission: EM | Admit: 2012-12-15 | Discharge: 2012-12-15 | Disposition: A | Discharge status: Home / Self Care | Payer: Self-pay | Attending: Emergency Medicine | Admitting: Emergency Medicine

## 2012-12-15 ENCOUNTER — Encounter (HOSPITAL_COMMUNITY): Payer: Self-pay

## 2012-12-15 DIAGNOSIS — F17200 Nicotine dependence, unspecified, uncomplicated: Secondary | ICD-10-CM | POA: Insufficient documentation

## 2012-12-15 DIAGNOSIS — I10 Essential (primary) hypertension: Secondary | ICD-10-CM | POA: Insufficient documentation

## 2012-12-15 DIAGNOSIS — Z90710 Acquired absence of both cervix and uterus: Secondary | ICD-10-CM | POA: Insufficient documentation

## 2012-12-15 DIAGNOSIS — R109 Unspecified abdominal pain: Secondary | ICD-10-CM

## 2012-12-15 DIAGNOSIS — R1084 Generalized abdominal pain: Secondary | ICD-10-CM | POA: Insufficient documentation

## 2012-12-15 DIAGNOSIS — Z9889 Other specified postprocedural states: Secondary | ICD-10-CM | POA: Insufficient documentation

## 2012-12-15 DIAGNOSIS — Z8719 Personal history of other diseases of the digestive system: Secondary | ICD-10-CM | POA: Insufficient documentation

## 2012-12-15 DIAGNOSIS — R110 Nausea: Secondary | ICD-10-CM | POA: Insufficient documentation

## 2012-12-15 DIAGNOSIS — G8929 Other chronic pain: Secondary | ICD-10-CM | POA: Insufficient documentation

## 2012-12-15 HISTORY — DX: Gastrointestinal hemorrhage, unspecified: K92.2

## 2012-12-15 HISTORY — DX: Disease of pancreas, unspecified: K86.9

## 2012-12-15 LAB — COMPREHENSIVE METABOLIC PANEL
ALT: 14 U/L (ref 0–35)
AST: 12 U/L (ref 0–37)
Albumin: 3.8 g/dL (ref 3.5–5.2)
Alkaline Phosphatase: 80 U/L (ref 39–117)
BUN: 10 mg/dL (ref 6–23)
CO2: 28 mEq/L (ref 19–32)
Calcium: 9.2 mg/dL (ref 8.4–10.5)
Chloride: 105 mEq/L (ref 96–112)
Creatinine, Ser: 0.82 mg/dL (ref 0.50–1.10)
GFR calc Af Amer: >90 mL/min (ref >90)
GFR calc non Af Amer: 81 mL/min — ABNORMAL LOW (ref >90)
Glucose, Bld: 94 mg/dL (ref 70–99)
Potassium: 3.7 mEq/L (ref 3.5–5.1)
Sodium: 141 mEq/L (ref 135–145)
Total Bilirubin: 0.3 mg/dL (ref 0.3–1.2)
Total Protein: 6.8 g/dL (ref 6.0–8.3)

## 2012-12-15 LAB — CBC
HCT: 43.8 % (ref 36.0–46.0)
Hemoglobin: 15.1 g/dL — ABNORMAL HIGH (ref 12.0–15.0)
MCH: 31.6 pg (ref 26.0–34.0)
MCHC: 34.5 g/dL (ref 30.0–36.0)
MCV: 91.6 fL (ref 78.0–100.0)
Platelets: 242 10*3/uL (ref 150–400)
RBC: 4.78 MIL/uL (ref 3.87–5.11)
RDW: 12.5 % (ref 11.5–15.5)
WBC: 5 10*3/uL (ref 4.0–10.5)

## 2012-12-15 LAB — LIPASE, BLOOD: Lipase: 27 U/L (ref 11–59)

## 2012-12-15 MED ORDER — SUCRALFATE 1 G PO TABS: 1.0000 g | ORAL_TABLET | Freq: Four times a day (QID) | ORAL | Status: DC

## 2012-12-15 MED ORDER — GI COCKTAIL ~~LOC~~
30.0000 mL | Freq: Once | ORAL | Status: AC
  Administered 2012-12-15: 30 mL via ORAL
  Filled 2012-12-15: qty 30

## 2012-12-15 MED ORDER — PANTOPRAZOLE SODIUM 40 MG IV SOLR
40.0000 mg | Freq: Once | INTRAVENOUS | Status: AC
  Administered 2012-12-15: 40 mg via INTRAVENOUS
  Filled 2012-12-15: qty 40

## 2012-12-15 MED ORDER — MORPHINE SULFATE 4 MG/ML IJ SOLN
4.0000 mg | Freq: Once | INTRAMUSCULAR | Status: AC
  Administered 2012-12-15: 4 mg via INTRAVENOUS
  Filled 2012-12-15: qty 1

## 2012-12-15 MED ORDER — ONDANSETRON HCL 4 MG/2ML IJ SOLN
4.0000 mg | Freq: Once | INTRAMUSCULAR | Status: AC
  Administered 2012-12-15: 4 mg via INTRAVENOUS
  Filled 2012-12-15: qty 2

## 2012-12-15 MED ORDER — SODIUM CHLORIDE 0.9 % IV BOLUS (SEPSIS)
1000.0000 mL | Freq: Once | INTRAVENOUS | Status: AC
  Administered 2012-12-15: 1000 mL via INTRAVENOUS

## 2012-12-15 NOTE — ED Notes (Signed)
Pt had bowel movement in nun's cap and sample used for hemoccult. The test was negative and EDP aware.

## 2012-12-15 NOTE — ED Notes (Signed)
Pt reports coffee ground stools for 2 days, severe ab pain and cramping for 2 days, h/o of gi bleed.

## 2012-12-15 NOTE — ED Provider Notes (Signed)
History   This chart was scribed for Heather Razor, MD by Heather [name], ED Scribe. The patient was seen in room APA14/APA14 and the patient's care was started at 9:55AM.    CSN: [id number]  Arrival date & time 12/15/12  0919   First MD Initiated Contact with Patient 12/15/12 [phone]      Chief Complaint  Patient presents with  . Abdominal Pain    (Consider location/radiation/quality/duration/timing/severity/associated sxs/prior treatment) Patient is a 50 y.o. female presenting with abdominal pain. The history is provided by the patient. No language interpreter was used.  Abdominal Pain The primary symptoms of the illness include abdominal pain. The current episode started 2 days ago. The onset of the illness was sudden. The problem has been gradually worsening.  The abdominal pain began 2 days ago. The pain came on suddenly. The abdominal pain has been gradually worsening since its onset. The abdominal pain is generalized. The abdominal pain does not radiate. The abdominal pain is relieved by nothing.    Heather [name] is a 51 y.o. female , with a hx of GI bleed (March 2013) and Barrett's esophageal ulceration, who presents to the Emergency Department complaining of sudden, progressively worsening, diffuse abdominal pain, onset two days ago (12/13/12).  Associated symptoms include nausea. The pt reports the last bowel movement she had was early this morning, which she characterizes as black in color. The pt has additional hx of abdominal adhesion surgery, abdominal hysterectomy, and ventral hernia repair.  The pt denies vomiting, dizziness, and light headedness.   The pt is a current everyday smoker, however, she does not drink alcohol.   PCP is Heather [name].     Past Medical History  Diagnosis Date  . Chronic back pain   . Hypertension   . Barrett's esophageal ulceration   . GI bleed   . Pancreatic lesion     Past Surgical History  Procedure Date  . Back surgery   .  Abdominal surgery     at age 21 for malrotation; appendectomy  . Cesarean section   . Ventral hernia repair 2003  . Abdominal hysterectomy 2003    with Left Salpingo  . Laparoscopic incisional / umbilical / ventral hernia repair 2008  . Laparoscopic nissen fundoplication 2000  . Esophagogastroduodenoscopy 02/26/2012    Procedure: ESOPHAGOGASTRODUODENOSCOPY (EGD);  Surgeon: Heather Kuba, MD;  Location: [facility];  Service: Endoscopy;  Laterality: N/A;    No family history on file.  History  Substance Use Topics  . Smoking status: Current Every Day Smoker    Types: Cigarettes  . Smokeless tobacco: Not on file  . Alcohol Use: No    OB History    Grav Para Term Preterm Abortions TAB SAB Ect Mult Living                  Review of Systems  Gastrointestinal: Positive for abdominal pain.  All other systems reviewed and are negative.    Allergies  Penicillins  Home Medications   Current Outpatient Rx  Name  Route  Sig  Dispense  Refill  . ADULT MULTIVITAMIN W/MINERALS CH   Oral   Take 1 tablet by mouth daily.         Marland Kitchen PSEUDOEPHEDRINE-IBUPROFEN 30-200 MG PO TABS   Oral   Take 1 tablet by mouth daily as needed.           BP 145/117  Pulse 107  Temp 98.4 F (36.9 C) (Oral)  Resp 20  SpO2  100%  Physical Exam  Nursing note and vitals reviewed. Constitutional: She appears well-developed and well-nourished. No distress.  HENT:  Head: Normocephalic and atraumatic.  Eyes: Conjunctivae normal are normal. Right eye exhibits no discharge. Left eye exhibits no discharge.  Neck: Neck supple.  Cardiovascular: Normal rate, regular rhythm and normal heart sounds.  Exam reveals no gallop and no friction rub.   No murmur heard. Pulmonary/Chest: Effort normal and breath sounds normal. No respiratory distress.  Abdominal: Soft. She exhibits no distension. There is tenderness. There is guarding. There is no rebound.       Epigastric tenderness. Voluntary guarding. No  rebound.   Musculoskeletal: She exhibits no edema and no tenderness.  Neurological: She is alert.  Skin: Skin is warm and dry.  Psychiatric: She has a normal mood and affect. Her behavior is normal. Thought content normal.    ED Course  Procedures (including critical care time)  DIAGNOSTIC STUDIES: Oxygen Saturation is 100% on room air, normal by my interpretation.    COORDINATION OF CARE:  10:32 AM- Treatment plan concerning blood work discussed with patient. Pt agrees with treatment.  12:55 PM- Recheck. Treatment plan concerning laboratory results discussed with patient. Pt agrees with treatment.       Results for orders placed during the hospital encounter of 12/15/12  COMPREHENSIVE METABOLIC PANEL      Component Value Range   Sodium 141  135 - 145 mEq/L   Potassium 3.7  3.5 - 5.1 mEq/L   Chloride 105  96 - 112 mEq/L   CO2 28  19 - 32 mEq/L   Glucose, Bld 94  70 - 99 mg/dL   BUN 10  6 - 23 mg/dL   Creatinine, Ser 1.61  0.50 - 1.10 mg/dL   Calcium 9.2  8.4 - 09.6 mg/dL   Total Protein 6.8  6.0 - 8.3 g/dL   Albumin 3.8  3.5 - 5.2 g/dL   AST 12  0 - 37 U/L   ALT 14  0 - 35 U/L   Alkaline Phosphatase 80  39 - 117 U/L   Total Bilirubin 0.3  0.3 - 1.2 mg/dL   GFR calc non Af Amer 81 (*) >90 mL/min   GFR calc Af Amer >90  >90 mL/min  LIPASE, BLOOD      Component Value Range   Lipase 27  11 - 59 U/L  CBC      Component Value Range   WBC 5.0  4.0 - 10.5 K/uL   RBC 4.78  3.87 - 5.11 MIL/uL   Hemoglobin 15.1 (*) 12.0 - 15.0 g/dL   HCT 04.5  40.9 - 81.1 %   MCV 91.6  78.0 - 100.0 fL   MCH 31.6  26.0 - 34.0 pg   MCHC 34.5  30.0 - 36.0 g/dL   RDW 91.4  78.2 - 95.6 %   Platelets 242  150 - 400 K/uL       No results found.   1. Abdominal pain       MDM  17-year-old female with abdominal pain. Possibly peptic ulcer disease. Patient with history of significant GI bleed requiring transfusion fairly recently. Her hemoglobin today is 15. Her stool is heme negative  for blood. She does have some abdominal tenderness, but she's not peritoneal.patient is starting on an H2 blocker. We'll give a trial of Carafate. Emergent return precautions were discussed. Outpatient followup otherwise.     I personally preformed the services scribed in my presence. The recorded  information has been reviewed is accurate. Heather Razor, MD.    Heather Razor, MD 12/20/12 [phone]

## 2012-12-17 LAB — OCCULT BLOOD, POC DEVICE: Fecal Occult Bld: NEGATIVE

## 2013-02-02 ENCOUNTER — Other Ambulatory Visit: Payer: Self-pay

## 2013-03-19 ENCOUNTER — Emergency Department (HOSPITAL_BASED_OUTPATIENT_CLINIC_OR_DEPARTMENT_OTHER)
Admission: EM | Admit: 2013-03-19 | Discharge: 2013-03-19 | Disposition: A | Discharge status: Home / Self Care | Payer: Self-pay | Attending: Emergency Medicine | Admitting: Emergency Medicine

## 2013-03-19 ENCOUNTER — Emergency Department (HOSPITAL_BASED_OUTPATIENT_CLINIC_OR_DEPARTMENT_OTHER): Payer: Self-pay

## 2013-03-19 ENCOUNTER — Encounter (HOSPITAL_BASED_OUTPATIENT_CLINIC_OR_DEPARTMENT_OTHER): Payer: Self-pay | Admitting: *Deleted

## 2013-03-19 DIAGNOSIS — I10 Essential (primary) hypertension: Secondary | ICD-10-CM | POA: Insufficient documentation

## 2013-03-19 DIAGNOSIS — Z8719 Personal history of other diseases of the digestive system: Secondary | ICD-10-CM | POA: Insufficient documentation

## 2013-03-19 DIAGNOSIS — G8929 Other chronic pain: Secondary | ICD-10-CM | POA: Insufficient documentation

## 2013-03-19 DIAGNOSIS — M542 Cervicalgia: Secondary | ICD-10-CM | POA: Insufficient documentation

## 2013-03-19 DIAGNOSIS — M549 Dorsalgia, unspecified: Secondary | ICD-10-CM | POA: Insufficient documentation

## 2013-03-19 DIAGNOSIS — F17200 Nicotine dependence, unspecified, uncomplicated: Secondary | ICD-10-CM | POA: Insufficient documentation

## 2013-03-19 DIAGNOSIS — M546 Pain in thoracic spine: Secondary | ICD-10-CM | POA: Insufficient documentation

## 2013-03-19 DIAGNOSIS — R0789 Other chest pain: Secondary | ICD-10-CM

## 2013-03-19 DIAGNOSIS — R071 Chest pain on breathing: Secondary | ICD-10-CM | POA: Insufficient documentation

## 2013-03-19 DIAGNOSIS — Z79899 Other long term (current) drug therapy: Secondary | ICD-10-CM | POA: Insufficient documentation

## 2013-03-19 MED ORDER — HYDROCODONE-ACETAMINOPHEN 5-325 MG PO TABS: 2.0000 | ORAL_TABLET | Freq: Four times a day (QID) | ORAL | Status: DC | PRN

## 2013-03-19 MED ORDER — HYDROCODONE-ACETAMINOPHEN 5-325 MG PO TABS
2.0000 | ORAL_TABLET | Freq: Once | ORAL | Status: AC
  Administered 2013-03-19: 2 via ORAL
  Filled 2013-03-19: qty 2

## 2013-03-19 MED ORDER — IOHEXOL 300 MG/ML  SOLN
100.0000 mL | Freq: Once | INTRAMUSCULAR | Status: AC | PRN
  Administered 2013-03-19: 100 mL via INTRAVENOUS

## 2013-03-19 MED ORDER — HYDROCODONE-ACETAMINOPHEN 5-325 MG PO TABS
ORAL_TABLET | ORAL | Status: AC
  Administered 2013-03-19: 1
  Filled 2013-03-19: qty 1

## 2013-03-19 NOTE — ED Notes (Signed)
Chest pain. Pain in her right chest, shoulder, upper back and neck x 5 days. Manual pressure reliefs the pain. States it feels like she sleep wrong and got a crick in her neck.

## 2013-03-19 NOTE — ED Provider Notes (Signed)
History     CSN: [id number]  Arrival date & time 03/19/13  1045   First MD Initiated Contact with Patient 03/19/13 1132      Chief Complaint  Patient presents with  . Chest Pain    (Consider location/radiation/quality/duration/timing/severity/associated sxs/prior treatment) Patient is a 52 y.o. female presenting with chest pain.  Chest Pain  Complains of midthoracic back pain radiating to her right chest and her right neck onset 5 days ago, constant worse with changing positions or moving her right arm improved with remaining still or by pressing on her upper back. Treated with Tylenol with partial relief. No associated shortness of breath nausea or sweatiness. No cough no fever No other complaint. No other associated symptoms Past Medical History  Diagnosis Date  . Chronic back pain   . Hypertension   . Barrett's esophageal ulceration   . GI bleed   . Pancreatic lesion    Cervical spine injury many years ago Past Surgical History  Procedure Laterality Date  . Back surgery    . Abdominal surgery      at age 77 for malrotation; appendectomy  . Cesarean section    . Ventral hernia repair  2003  . Abdominal hysterectomy  2003    with Left Salpingo  . Laparoscopic incisional / umbilical / ventral hernia repair  2008  . Laparoscopic nissen fundoplication  2000  . Esophagogastroduodenoscopy  02/26/2012    Procedure: ESOPHAGOGASTRODUODENOSCOPY (EGD);  Surgeon: Petra Kuba, MD;  Location: [facility];  Service: Endoscopy;  Laterality: N/A;    No family history on file.  History  Substance Use Topics  . Smoking status: Current Every Day Smoker -- 0.50 packs/day    Types: Cigarettes  . Smokeless tobacco: Not on file  . Alcohol Use: No    OB History   Grav Para Term Preterm Abortions TAB SAB Ect Mult Living                  Review of Systems  Constitutional: Negative.   HENT: Positive for neck pain.   Respiratory: Negative.   Cardiovascular: Positive for chest  pain.  Gastrointestinal: Negative.   Skin: Negative.   Neurological: Negative.   Psychiatric/Behavioral: Negative.   All other systems reviewed and are negative.    Allergies  Penicillins  Home Medications   Current Outpatient Rx  Name  Route  Sig  Dispense  Refill  . Lansoprazole (PREVACID PO)   Oral   Take by mouth.         . Multiple Vitamin (MULTIVITAMIN WITH MINERALS) TABS   Oral   Take 1 tablet by mouth daily.         . Pseudoephedrine-Ibuprofen (ADVIL COLD/SINUS) 30-200 MG TABS   Oral   Take 1 tablet by mouth daily as needed.         . sucralfate (CARAFATE) 1 G tablet   Oral   Take 1 tablet (1 g total) by mouth 4 (four) times daily.   30 tablet   0     30 minutes before meals     BP 139/76  Pulse 82  Temp(Src) 99.1 F (37.3 C) (Oral)  Resp 22  Wt 167 lb (75.751 kg)  BMI 25.4 kg/m2  SpO2 97%  Physical Exam  Nursing note and vitals reviewed. Constitutional: She appears well-developed and well-nourished.  HENT:  Head: Normocephalic and atraumatic.  Eyes: Conjunctivae are normal. Pupils are equal, round, and reactive to light.  Neck: Neck supple. No tracheal deviation  present. No thyromegaly present.  Cardiovascular: Normal rate and regular rhythm.   No murmur heard. Pulmonary/Chest: Effort normal and breath sounds normal.  Right chest is tender. Pain is easily reproduced by forcible abduction of the right shoulder  Abdominal: Soft. Bowel sounds are normal. She exhibits no distension. There is no tenderness.  Musculoskeletal: Normal range of motion. She exhibits no edema and no tenderness.  Entire spine nontender. All 4 extremities nontender, neurovascularly intact  Neurological: She is alert. No cranial nerve deficit. Coordination normal.  Skin: Skin is warm and dry. No rash noted.  Psychiatric: She has a normal mood and affect.    ED Course  Procedures (including critical care time)  Labs Reviewed - No data to display No results  found.   No diagnosis found.   Date: 03/19/2013  Rate: 80  Rhythm: normal sinus rhythm  QRS Axis: normal  Intervals: normal  ST/T Wave abnormalities: normal  Conduction Disutrbances:none  Narrative Interpretation:   Old EKG Reviewed: Tracing from 02/25/2012 showed sinus tachycardia approximately 140 beats per minute otherwise no significant changes interpreted by me Chest x-ray viewed by me and discussed with radiologist. 12:25 PM pain improved after treatment with Norco. 1:35 PM requesting or pain medicine additional Norco ordered. 1:45 PM patient is ready to go home MDM   Symptoms and exam highly atypical for acute coronary syndrome patient has normal EKG. Negative chest CT scan strongly doubt pulmonary embolism. Symptoms and exam consistent with musculoskeletal pain Plan prescription Norco Spent 5 minutes counseling patient on smoking cessation Diagnosis #1 chest wall pain #2 tobacco abuse        Doug Sou, MD 03/19/13 1351

## 2013-03-19 NOTE — ED Notes (Signed)
Pt states she needs something else for pain.

## 2013-10-21 ENCOUNTER — Encounter (HOSPITAL_COMMUNITY): Payer: Self-pay | Admitting: Emergency Medicine

## 2013-10-21 ENCOUNTER — Emergency Department (HOSPITAL_COMMUNITY): Payer: Self-pay

## 2013-10-21 ENCOUNTER — Emergency Department (HOSPITAL_COMMUNITY)
Admission: EM | Admit: 2013-10-21 | Discharge: 2013-10-21 | Disposition: A | Discharge status: Home / Self Care | Payer: Self-pay | Attending: Emergency Medicine | Admitting: Emergency Medicine

## 2013-10-21 DIAGNOSIS — G8929 Other chronic pain: Secondary | ICD-10-CM | POA: Insufficient documentation

## 2013-10-21 DIAGNOSIS — F17200 Nicotine dependence, unspecified, uncomplicated: Secondary | ICD-10-CM | POA: Insufficient documentation

## 2013-10-21 DIAGNOSIS — Z8719 Personal history of other diseases of the digestive system: Secondary | ICD-10-CM | POA: Insufficient documentation

## 2013-10-21 DIAGNOSIS — R0789 Other chest pain: Secondary | ICD-10-CM

## 2013-10-21 DIAGNOSIS — G8911 Acute pain due to trauma: Secondary | ICD-10-CM | POA: Insufficient documentation

## 2013-10-21 DIAGNOSIS — Z79899 Other long term (current) drug therapy: Secondary | ICD-10-CM | POA: Insufficient documentation

## 2013-10-21 DIAGNOSIS — Z880 Allergy status to penicillin: Secondary | ICD-10-CM | POA: Insufficient documentation

## 2013-10-21 DIAGNOSIS — R0602 Shortness of breath: Secondary | ICD-10-CM | POA: Insufficient documentation

## 2013-10-21 DIAGNOSIS — I10 Essential (primary) hypertension: Secondary | ICD-10-CM | POA: Insufficient documentation

## 2013-10-21 DIAGNOSIS — R071 Chest pain on breathing: Secondary | ICD-10-CM | POA: Insufficient documentation

## 2013-10-21 LAB — CBC WITH DIFFERENTIAL/PLATELET
Basophils Absolute: 0.1 10*3/uL (ref 0.0–0.1)
Basophils Relative: 1 % (ref 0–1)
Eosinophils Absolute: 0.2 10*3/uL (ref 0.0–0.7)
HCT: 42.5 % (ref 36.0–46.0)
Hemoglobin: 14.8 g/dL (ref 12.0–15.0)
MCH: 32.2 pg (ref 26.0–34.0)
MCHC: 34.8 g/dL (ref 30.0–36.0)
Monocytes Absolute: 0.5 10*3/uL (ref 0.1–1.0)
Monocytes Relative: 5 % (ref 3–12)
Neutro Abs: 5.9 10*3/uL (ref 1.7–7.7)
Neutrophils Relative %: 60 % (ref 43–77)
Platelets: 270 10*3/uL (ref 150–400)

## 2013-10-21 LAB — BASIC METABOLIC PANEL
BUN: 8 mg/dL (ref 6–23)
Chloride: 105 mEq/L (ref 96–112)
GFR calc Af Amer: >90 mL/min (ref >90)
GFR calc non Af Amer: >90 mL/min (ref >90)
Potassium: 3.5 mEq/L (ref 3.5–5.1)
Sodium: 143 mEq/L (ref 135–145)

## 2013-10-21 LAB — D-DIMER, QUANTITATIVE: D-Dimer, Quant: <0.27 ug/mL-FEU (ref 0.00–0.48)

## 2013-10-21 LAB — TROPONIN I: Troponin I: <0.3 ng/mL (ref <0.30)

## 2013-10-21 MED ORDER — OXYCODONE-ACETAMINOPHEN 5-325 MG PO TABS
1.0000 | ORAL_TABLET | Freq: Once | ORAL | Status: AC
  Administered 2013-10-21: 1 via ORAL
  Filled 2013-10-21: qty 1

## 2013-10-21 MED ORDER — OXYCODONE-ACETAMINOPHEN 5-325 MG PO TABS: 1.0000 | ORAL_TABLET | ORAL | Status: DC | PRN

## 2013-10-21 NOTE — ED Notes (Addendum)
Pt fell and hit right side of chest/breast and rib area on counter in August. States has seen urgent care twice since then. Pt states has had pain since but pain feels deeper in chest now. Pain worse with palpation upon assessment. Deep breathing makes pain worse. Nad. Denies sob/cough. Pain worse with movement

## 2013-10-22 NOTE — ED Provider Notes (Signed)
CSN: [id number]     Arrival date & time 10/21/13  1615 History   First MD Initiated Contact with Patient 10/21/13 1728     Chief Complaint  Patient presents with  . Chest Pain   (Consider location/radiation/quality/duration/timing/severity/associated sxs/prior Treatment) HPI Comments: Heather [name] is a 52 y.o. Female presenting with right sided chest pain worsened with deep inspiration and movements, such as sitting up from a supine position.  She describes having an injury to her right breast 2 months ago, hitting the nipple area on a counter edge resulting in a slow to resolve hematoma and bruising.  This still feels sore,  But now describes a much deeper pain as described above,  With radiation into her right upper back.  She has mild shortness of breath which is intermittent, stating talking currently makes her feel winded.  She denies fevers, chills, cough, abdominal pain, nausea or vomiting. She denies pain or swelling in her extremities and has denies recent episodes of prolonged bedrest or sedentary episodes.  She has found no alleviators for her pain.     The history is provided by the patient.    Past Medical History  Diagnosis Date  . Chronic back pain   . Hypertension   . Barrett's esophageal ulceration   . GI bleed   . Pancreatic lesion    Past Surgical History  Procedure Laterality Date  . Back surgery    . 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 Cesarean section    . Ventral hernia repair  2003  . Abdominal hysterectomy  2003    with Left Salpingo  . Laparoscopic incisional / umbilical / ventral hernia repair  2008  . Laparoscopic nissen fundoplication  2000  . Esophagogastroduodenoscopy  02/26/2012    Procedure: ESOPHAGOGASTRODUODENOSCOPY (EGD);  Surgeon: Petra Kuba, MD;  Location: [facility];  Service: Endoscopy;  Laterality: N/A;   History reviewed. No pertinent family history. History  Substance Use Topics  . Smoking status:  Current Every Day Smoker -- 0.50 packs/day    Types: Cigarettes  . Smokeless tobacco: Not on file  . Alcohol Use: No   OB History   Grav Para Term Preterm Abortions TAB SAB Ect Mult Living                 Review of Systems  Constitutional: Negative for fever and chills.  HENT: Negative for congestion and sore throat.   Eyes: Negative.   Respiratory: Positive for shortness of breath. Negative for chest tightness, wheezing and stridor.   Cardiovascular: Positive for chest pain. Negative for leg swelling.  Gastrointestinal: Negative for nausea, vomiting and abdominal pain.  Genitourinary: Negative.   Musculoskeletal: Negative for arthralgias, joint swelling and neck pain.  Skin: Negative.  Negative for rash and wound.  Neurological: Negative for dizziness, weakness, light-headedness, numbness and headaches.  Psychiatric/Behavioral: Negative.     Allergies  Penicillins  Home Medications   Current Outpatient Rx  Name  Route  Sig  Dispense  Refill  . HYDROcodone-acetaminophen (NORCO/VICODIN) 5-325 MG per tablet   Oral   Take 2 tablets by mouth every 6 (six) hours as needed for pain.   10 tablet   0   . Lansoprazole (PREVACID PO)   Oral   Take by mouth.         . Multiple Vitamin (MULTIVITAMIN WITH MINERALS) TABS   Oral   Take 1 tablet by mouth daily.         Marland Kitchen  oxyCODONE-acetaminophen (PERCOCET/ROXICET) 5-325 MG per tablet   Oral   Take 1 tablet by mouth every 4 (four) hours as needed for pain.   20 tablet   0   . Pseudoephedrine-Ibuprofen (ADVIL COLD/SINUS) 30-200 MG TABS   Oral   Take 1 tablet by mouth daily as needed.         . sucralfate (CARAFATE) 1 G tablet   Oral   Take 1 tablet (1 g total) by mouth 4 (four) times daily.   30 tablet   0     30 minutes before meals    BP 144/83  Pulse 97  Temp(Src) 98.8 F (37.1 C) (Oral)  Resp 20  Ht 5\' 5"  (1.651 m)  Wt 170 lb (77.111 kg)  BMI 28.29 kg/m2  SpO2 99% Physical Exam  Nursing note and vitals  reviewed. Constitutional: She appears well-developed and well-nourished. No distress.  Talking in complete sentences.  HENT:  Head: Normocephalic and atraumatic.  Eyes: Conjunctivae are normal.  Neck: Normal range of motion.  Cardiovascular: Normal rate, regular rhythm, normal heart sounds and intact distal pulses.   Pulmonary/Chest: Effort normal and breath sounds normal. She has no wheezes. She has no rales. She exhibits tenderness. She exhibits no crepitus, no deformity and no retraction. Right breast exhibits no mass and no skin change.    ttp right chest above breast.  No breast mass or ecchymosis.  No crepitus.  No tenderness to palpation right upper back.  Abdominal: Soft. Bowel sounds are normal. She exhibits no distension. There is no tenderness. There is no rebound.  Musculoskeletal: Normal range of motion. She exhibits no edema and no tenderness.  No ttp or edema in legs/ calves.   Neurological: She is alert.  Skin: Skin is warm and dry.  Psychiatric: She has a normal mood and affect.    ED Course  Procedures (including critical care time) Labs Review Labs Reviewed  TROPONIN I  D-DIMER, QUANTITATIVE  CBC WITH DIFFERENTIAL  BASIC METABOLIC PANEL   Imaging Review Dg Ribs Unilateral W/chest Right  10/21/2013   CLINICAL DATA:  Right-sided chest and flank pain with shortness of breath for 2 months  EXAM: RIGHT RIBS AND CHEST - 3+ VIEW  COMPARISON:  07/12/2013  FINDINGS: Normal heart size, mediastinal contours, and pulmonary vascularity.  Lungs clear.  No pleural effusion or pneumothorax.  Mild osseous demineralization.  Question costal bar between the lateral right 6th and 7th ribs with post approximation of the lateral right 7th and 8th ribs as well compression congenital anomaly versus sequela of prior surgery.  No acute right rib fracture or bone destruction identified.  IMPRESSION: No acute right rib abnormalities.   Electronically Signed   By: Ulyses Southward M.D.   On:  10/21/2013 17:21    EKG Interpretation     Ventricular Rate:  76 PR Interval:  124 QRS Duration: 82 QT Interval:  386 QTC Calculation: 434 R Axis:   17 Text Interpretation:  Normal sinus rhythm Possible Left atrial enlargement Nonspecific ST abnormality Abnormal ECG When compared with ECG of 19-Mar-2013 10:57, No significant change was found            MDM   1. Chest wall pain    Patients labs and/or radiological studies were viewed and considered during the medical decision making and disposition process. Pt with h/o right pneumothorax when a young child,  Possibly explaining costal bar finding on xray.  Labs normal,  ekg stable.  No acute findings with chest  wall pain which is reproducible.  She was prescribed oxycodone for pain relief, advised heating pad to chest wall qid.  Pt cannot tolerate nsaids due to GI bleed history.  Encouraged f/u with pcp for a recheck if sx not improving over the next week.  discussed with Dr. Estell Harpin prior to dc home.    Burgess Amor, PA-C 10/22/13 [phone]

## 2013-10-22 NOTE — ED Provider Notes (Signed)
Medical screening examination/treatment/procedure(s) were performed by non-physician practitioner and as supervising physician I was immediately available for consultation/collaboration.  EKG Interpretation     Ventricular Rate:  76 PR Interval:  124 QRS Duration: 82 QT Interval:  386 QTC Calculation: 434 R Axis:   17 Text Interpretation:  Normal sinus rhythm Possible Left atrial enlargement Nonspecific ST abnormality Abnormal ECG When compared with ECG of 19-Mar-2013 10:57, No significant change was found              Benny Lennert, MD 10/22/13 1654

## 2013-10-24 ENCOUNTER — Other Ambulatory Visit: Payer: Self-pay

## 2014-01-02 ENCOUNTER — Other Ambulatory Visit (HOSPITAL_COMMUNITY): Payer: Self-pay | Admitting: *Deleted

## 2014-01-02 DIAGNOSIS — Z139 Encounter for screening, unspecified: Secondary | ICD-10-CM

## 2014-01-06 ENCOUNTER — Ambulatory Visit (HOSPITAL_COMMUNITY)
Admission: RE | Admit: 2014-01-06 | Discharge: 2014-01-06 | Disposition: A | Discharge status: Home / Self Care | Payer: PRIVATE HEALTH INSURANCE | Source: Ambulatory Visit | Attending: *Deleted | Admitting: *Deleted

## 2014-01-06 DIAGNOSIS — Z139 Encounter for screening, unspecified: Secondary | ICD-10-CM

## 2014-01-06 DIAGNOSIS — Z1231 Encounter for screening mammogram for malignant neoplasm of breast: Secondary | ICD-10-CM | POA: Insufficient documentation

## 2014-07-07 ENCOUNTER — Emergency Department (HOSPITAL_COMMUNITY)
Admission: EM | Admit: 2014-07-07 | Discharge: 2014-07-07 | Disposition: A | Discharge status: Home / Self Care | Payer: Self-pay | Attending: Emergency Medicine | Admitting: Emergency Medicine

## 2014-07-07 ENCOUNTER — Encounter (HOSPITAL_COMMUNITY): Payer: Self-pay | Admitting: Emergency Medicine

## 2014-07-07 DIAGNOSIS — K029 Dental caries, unspecified: Secondary | ICD-10-CM | POA: Insufficient documentation

## 2014-07-07 DIAGNOSIS — Z79899 Other long term (current) drug therapy: Secondary | ICD-10-CM | POA: Insufficient documentation

## 2014-07-07 DIAGNOSIS — F17200 Nicotine dependence, unspecified, uncomplicated: Secondary | ICD-10-CM | POA: Insufficient documentation

## 2014-07-07 DIAGNOSIS — G8929 Other chronic pain: Secondary | ICD-10-CM | POA: Insufficient documentation

## 2014-07-07 DIAGNOSIS — K2270 Barrett's esophagus without dysplasia: Secondary | ICD-10-CM | POA: Insufficient documentation

## 2014-07-07 DIAGNOSIS — Z880 Allergy status to penicillin: Secondary | ICD-10-CM | POA: Insufficient documentation

## 2014-07-07 DIAGNOSIS — I10 Essential (primary) hypertension: Secondary | ICD-10-CM | POA: Insufficient documentation

## 2014-07-07 DIAGNOSIS — K089 Disorder of teeth and supporting structures, unspecified: Secondary | ICD-10-CM | POA: Insufficient documentation

## 2014-07-07 DIAGNOSIS — K0889 Other specified disorders of teeth and supporting structures: Secondary | ICD-10-CM

## 2014-07-07 MED ORDER — CLINDAMYCIN HCL 150 MG PO CAPS
300.0000 mg | ORAL_CAPSULE | Freq: Once | ORAL | Status: AC
  Administered 2014-07-07: 300 mg via ORAL
  Filled 2014-07-07: qty 2

## 2014-07-07 MED ORDER — CLINDAMYCIN HCL 300 MG PO CAPS: 300.0000 mg | ORAL_CAPSULE | Freq: Four times a day (QID) | ORAL | Status: DC

## 2014-07-07 MED ORDER — HYDROCODONE-ACETAMINOPHEN 5-325 MG PO TABS: ORAL_TABLET | ORAL | Status: DC

## 2014-07-07 MED ORDER — HYDROCODONE-ACETAMINOPHEN 5-325 MG PO TABS
1.0000 | ORAL_TABLET | Freq: Once | ORAL | Status: AC
  Administered 2014-07-07: 1 via ORAL
  Filled 2014-07-07: qty 1

## 2014-07-07 NOTE — ED Provider Notes (Signed)
CSN: [id number]     Arrival date & time 07/07/14  1041 History   None   This chart was scribed for non-physician practitioner Pauline Aus, PA-C working with Heather Porter, MD, by Andrew Au, ED Scribe. This patient was seen in room APFT21/APFT21 and the patient's care was started at 11:45 AM. Chief Complaint  Patient presents with  . Abscess  . Dental Pain    The history is provided by the patient. No language interpreter was used.   Heather [name] is a 53 y.o. female who presents to the Emergency Department complaining of worsening intermittent pain to right second lower molar and right second upper bicuspid onset a couple of months. She reports pain was getting better but worsened about 5 days ago with associated achiness and jaw pain.  She reports she is unable to be seen by a dentist due to not having insurance. Pt reports feeling achy. She denies fever, vomiting, difficulty swallowing or neck pain    Past Medical History  Diagnosis Date  . Chronic back pain   . Hypertension   . Barrett's esophageal ulceration   . GI bleed   . Pancreatic lesion    Past Surgical History  Procedure Laterality Date  . Back surgery    . Abdominal surgery      at age 39 for malrotation; appendectomy  . Cesarean section    . Ventral hernia repair  2003  . Abdominal hysterectomy  2003    with Left Salpingo  . Laparoscopic incisional / umbilical / ventral hernia repair  2008  . Laparoscopic nissen fundoplication  2000  . Esophagogastroduodenoscopy  02/26/2012    Procedure: ESOPHAGOGASTRODUODENOSCOPY (EGD);  Surgeon: Petra Kuba, MD;  Location: [facility];  Service: Endoscopy;  Laterality: N/A;   History reviewed. No pertinent family history. History  Substance Use Topics  . Smoking status: Current Every Day Smoker -- 0.50 packs/day    Types: Cigarettes  . Smokeless tobacco: Not on file  . Alcohol Use: No   OB History   Grav Para Term Preterm Abortions TAB SAB Ect Mult Living                  Review of Systems  Constitutional: Negative for fever, chills and appetite change.  HENT: Positive for dental problem. Negative for congestion, facial swelling, mouth sores, sore throat and trouble swallowing.   Eyes: Negative for pain and visual disturbance.  Musculoskeletal: Negative for neck pain and neck stiffness.  Neurological: Negative for dizziness, facial asymmetry and headaches.  Hematological: Negative for adenopathy.  All other systems reviewed and are negative.  Allergies  Penicillins  Home Medications   Prior to Admission medications   Medication Sig Start Date End Date Taking? Authorizing Provider  HYDROcodone-acetaminophen (NORCO/VICODIN) 5-325 MG per tablet Take 2 tablets by mouth every 6 (six) hours as needed for pain. 03/19/13   Doug Sou, MD  Lansoprazole (PREVACID PO) Take by mouth.    Historical Provider, MD  Multiple Vitamin (MULTIVITAMIN WITH MINERALS) TABS Take 1 tablet by mouth daily.    Historical Provider, MD  oxyCODONE-acetaminophen (PERCOCET/ROXICET) 5-325 MG per tablet Take 1 tablet by mouth every 4 (four) hours as needed for pain. 10/21/13   Burgess Amor, PA-C  Pseudoephedrine-Ibuprofen (ADVIL COLD/SINUS) 30-200 MG TABS Take 1 tablet by mouth daily as needed.    Historical Provider, MD  sucralfate (CARAFATE) 1 G tablet Take 1 tablet (1 g total) by mouth 4 (four) times daily. 12/15/12   Raeford Razor, MD  Triage Vitals- BP 136/81  Pulse 81  Temp(Src) 98.8 F (37.1 C) (Oral)  Resp 20  Ht 5\' 5"  (1.651 m)  Wt 175 lb (79.379 kg)  BMI 29.12 kg/m2  SpO2 96% Physical Exam  Nursing note and vitals reviewed. Constitutional: She is oriented to person, place, and time. She appears well-developed and well-nourished. No distress.  HENT:  Head: Normocephalic and atraumatic.  Right Ear: Tympanic membrane and ear canal normal.  Left Ear: Tympanic membrane and ear canal normal.  Mouth/Throat: Uvula is midline, oropharynx is clear and moist and mucous  membranes are normal. No trismus in the jaw. Dental caries present. No dental abscesses or uvula swelling.    right second lower molar and right second upper bicuspid dental carries. No obvious dental abscess, facial edema and no trismus   Eyes: Conjunctivae and EOM are normal.  Neck: Normal range of motion. Neck supple.  Cardiovascular: Normal rate, regular rhythm and normal heart sounds.   No murmur heard. Pulmonary/Chest: Effort normal and breath sounds normal. No respiratory distress.  Musculoskeletal: Normal range of motion.  Lymphadenopathy:    She has no cervical adenopathy.  Neurological: She is alert and oriented to person, place, and time. She exhibits normal muscle tone. Coordination normal.  Skin: Skin is warm and dry.  Psychiatric: She has a normal mood and affect. Her behavior is normal.    ED Course  Procedures  DIAGNOSTIC STUDIES: Oxygen Saturation is 96% on RA, normal by my interpretation.    COORDINATION OF CARE: 11:52 AM- Pt advised of plan for treatment which includes abx and pt agrees.  Labs Review Labs Reviewed - No data to display  Imaging Review No results found.   EKG Interpretation None      MDM   Final diagnoses:  Pain, dental    Pt with recurrent dental pain.  No concerning sx's for infection to the deep structures of the neck or floor of the mouth.  Pt agrees to plan of vicodin #15 and clindamycin.  Referral info given for dentistry  I personally performed the services described in this documentation, which was scribed in my presence. The recorded information has been reviewed and is accurate.      [name], PA-C 07/10/14 1231

## 2014-07-07 NOTE — Discharge Instructions (Signed)
Dental Pain Toothache is pain in or around a tooth. It may get worse with chewing or with cold or heat.  HOME CARE  Your dentist may use a numbing medicine during treatment. If so, you may need to avoid eating until the medicine wears off. Ask your dentist about this.  Only take medicine as told by your dentist or doctor.  Avoid chewing food near the painful tooth until after all treatment is done. Ask your dentist about this. GET HELP RIGHT AWAY IF:   The problem gets worse or new problems appear.  You have a fever.  There is redness and puffiness (swelling) of the face, jaw, or neck.  You cannot open your mouth.  There is pain in the jaw.  There is very bad pain that is not helped by medicine. MAKE SURE YOU:   Understand these instructions.  Will watch your condition.  Will get help right away if you are not doing well or get worse. Document Released: 05/23/2008 Document Revised: 02/27/2012 Document Reviewed: 05/23/2008 [facility] Patient Information 2015 [place], [place]. This information is not intended to replace advice given to you by your health care provider. Make sure you discuss any questions you have with your health care provider.  Dental Pain Toothache is pain in or around a tooth. It may get worse with chewing or with cold or heat.  HOME CARE  Your dentist may use a numbing medicine during treatment. If so, you may need to avoid eating until the medicine wears off. Ask your dentist about this.  Only take medicine as told by your dentist or doctor.  Avoid chewing food near the painful tooth until after all treatment is done. Ask your dentist about this. GET HELP RIGHT AWAY IF:   The problem gets worse or new problems appear.  You have a fever.  There is redness and puffiness (swelling) of the face, jaw, or neck.  You cannot open your mouth.  There is pain in the jaw.  There is very bad pain that is not helped by medicine. MAKE SURE YOU:   Understand  these instructions.  Will watch your condition.  Will get help right away if you are not doing well or get worse. Document Released: 05/23/2008 Document Revised: 02/27/2012 Document Reviewed: 05/23/2008 [facility] Patient Information 2015 [place], [place]. This information is not intended to replace advice given to you by your health care provider. Make sure you discuss any questions you have with your health care provider.

## 2014-07-07 NOTE — ED Notes (Signed)
C/o tooth pain for couple months to both upper and lower side rt jaw. Pt seen at dental clinic but cannot get fixed d/t no insurance. C/o pain that radiates down neck.

## 2014-07-13 ENCOUNTER — Encounter (HOSPITAL_COMMUNITY): Payer: Self-pay | Admitting: Emergency Medicine

## 2014-07-13 ENCOUNTER — Emergency Department (HOSPITAL_COMMUNITY)
Admission: EM | Admit: 2014-07-13 | Discharge: 2014-07-13 | Disposition: A | Discharge status: Home / Self Care | Payer: Self-pay | Attending: Emergency Medicine | Admitting: Emergency Medicine

## 2014-07-13 DIAGNOSIS — K521 Toxic gastroenteritis and colitis: Secondary | ICD-10-CM

## 2014-07-13 DIAGNOSIS — K089 Disorder of teeth and supporting structures, unspecified: Secondary | ICD-10-CM | POA: Insufficient documentation

## 2014-07-13 DIAGNOSIS — R197 Diarrhea, unspecified: Secondary | ICD-10-CM | POA: Insufficient documentation

## 2014-07-13 DIAGNOSIS — M545 Low back pain, unspecified: Secondary | ICD-10-CM | POA: Insufficient documentation

## 2014-07-13 DIAGNOSIS — A6000 Herpesviral infection of urogenital system, unspecified: Secondary | ICD-10-CM | POA: Insufficient documentation

## 2014-07-13 DIAGNOSIS — Z79899 Other long term (current) drug therapy: Secondary | ICD-10-CM | POA: Insufficient documentation

## 2014-07-13 DIAGNOSIS — R6883 Chills (without fever): Secondary | ICD-10-CM | POA: Insufficient documentation

## 2014-07-13 DIAGNOSIS — F17200 Nicotine dependence, unspecified, uncomplicated: Secondary | ICD-10-CM | POA: Insufficient documentation

## 2014-07-13 DIAGNOSIS — Z880 Allergy status to penicillin: Secondary | ICD-10-CM | POA: Insufficient documentation

## 2014-07-13 DIAGNOSIS — R110 Nausea: Secondary | ICD-10-CM | POA: Insufficient documentation

## 2014-07-13 DIAGNOSIS — K0889 Other specified disorders of teeth and supporting structures: Secondary | ICD-10-CM

## 2014-07-13 DIAGNOSIS — G8929 Other chronic pain: Secondary | ICD-10-CM | POA: Insufficient documentation

## 2014-07-13 DIAGNOSIS — K2270 Barrett's esophagus without dysplasia: Secondary | ICD-10-CM | POA: Insufficient documentation

## 2014-07-13 DIAGNOSIS — R42 Dizziness and giddiness: Secondary | ICD-10-CM | POA: Insufficient documentation

## 2014-07-13 DIAGNOSIS — I10 Essential (primary) hypertension: Secondary | ICD-10-CM | POA: Insufficient documentation

## 2014-07-13 LAB — CBC WITH DIFFERENTIAL/PLATELET
BASOS ABS: 0 10*3/uL (ref 0.0–0.1)
Basophils Relative: 1 % (ref 0–1)
EOS PCT: 3 % (ref 0–5)
Eosinophils Absolute: 0.3 10*3/uL (ref 0.0–0.7)
HCT: 42.7 % (ref 36.0–46.0)
Hemoglobin: 15 g/dL (ref 12.0–15.0)
Lymphocytes Relative: 31 % (ref 12–46)
Lymphs Abs: 2.6 10*3/uL (ref 0.7–4.0)
MCH: 32.1 pg (ref 26.0–34.0)
MCHC: 35.1 g/dL (ref 30.0–36.0)
MCV: 91.4 fL (ref 78.0–100.0)
Monocytes Absolute: 0.4 10*3/uL (ref 0.1–1.0)
Monocytes Relative: 5 % (ref 3–12)
Neutro Abs: 5.1 10*3/uL (ref 1.7–7.7)
Neutrophils Relative %: 60 % (ref 43–77)
PLATELETS: 292 10*3/uL (ref 150–400)
RBC: 4.67 MIL/uL (ref 3.87–5.11)
RDW: 12.8 % (ref 11.5–15.5)
WBC: 8.4 10*3/uL (ref 4.0–10.5)

## 2014-07-13 LAB — URINALYSIS, ROUTINE W REFLEX MICROSCOPIC
Bilirubin Urine: NEGATIVE
GLUCOSE, UA: NEGATIVE mg/dL
KETONES UR: NEGATIVE mg/dL
LEUKOCYTES UA: NEGATIVE
Nitrite: NEGATIVE
Protein, ur: NEGATIVE mg/dL
Urobilinogen, UA: 0.2 mg/dL (ref 0.0–1.0)
pH: 5.5 (ref 5.0–8.0)

## 2014-07-13 LAB — BASIC METABOLIC PANEL
Anion gap: 13 (ref 5–15)
BUN: 11 mg/dL (ref 6–23)
CALCIUM: 9.6 mg/dL (ref 8.4–10.5)
CO2: 26 mEq/L (ref 19–32)
Chloride: 104 mEq/L (ref 96–112)
Creatinine, Ser: 0.77 mg/dL (ref 0.50–1.10)
GFR calc Af Amer: >90 mL/min (ref >90)
GLUCOSE: 92 mg/dL (ref 70–99)
Potassium: 3.8 mEq/L (ref 3.7–5.3)
Sodium: 143 mEq/L (ref 137–147)

## 2014-07-13 LAB — WET PREP, GENITAL
Trich, Wet Prep: NONE SEEN
Yeast Wet Prep HPF POC: NONE SEEN

## 2014-07-13 LAB — URINE MICROSCOPIC-ADD ON

## 2014-07-13 MED ORDER — MORPHINE SULFATE 4 MG/ML IJ SOLN
4.0000 mg | Freq: Once | INTRAMUSCULAR | Status: AC
  Administered 2014-07-13: 4 mg via INTRAVENOUS
  Filled 2014-07-13: qty 1

## 2014-07-13 MED ORDER — SODIUM CHLORIDE 0.9 % IV BOLUS (SEPSIS)
1000.0000 mL | Freq: Once | INTRAVENOUS | Status: AC
  Administered 2014-07-13: 1000 mL via INTRAVENOUS

## 2014-07-13 MED ORDER — ACYCLOVIR 200 MG PO CAPS: 400.0000 mg | ORAL_CAPSULE | Freq: Three times a day (TID) | ORAL | Status: DC

## 2014-07-13 MED ORDER — ONDANSETRON HCL 4 MG/2ML IJ SOLN
4.0000 mg | Freq: Once | INTRAMUSCULAR | Status: DC
  Filled 2014-07-13: qty 2

## 2014-07-13 MED ORDER — ACYCLOVIR 400 MG PO TABS: 400.0000 mg | ORAL_TABLET | Freq: Three times a day (TID) | ORAL | Status: DC

## 2014-07-13 MED ORDER — OXYCODONE-ACETAMINOPHEN 5-325 MG PO TABS: 1.0000 | ORAL_TABLET | ORAL | Status: DC | PRN

## 2014-07-13 MED ORDER — ACYCLOVIR 200 MG PO CAPS
400.0000 mg | ORAL_CAPSULE | Freq: Once | ORAL | Status: AC
  Administered 2014-07-13: 400 mg via ORAL
  Filled 2014-07-13: qty 2

## 2014-07-13 MED ORDER — ONDANSETRON HCL 4 MG/2ML IJ SOLN
4.0000 mg | Freq: Once | INTRAMUSCULAR | Status: AC
  Administered 2014-07-13: 4 mg via INTRAVENOUS

## 2014-07-13 MED ORDER — ACYCLOVIR 200 MG PO CAPS
ORAL_CAPSULE | ORAL | Status: AC
  Filled 2014-07-13: qty 2

## 2014-07-13 NOTE — ED Provider Notes (Signed)
CSN: [id number]     Arrival date & time 07/13/14  1348 History  This chart was scribed for non-physician practitioner, Burgess Amor, PA-C,working with Donnetta Hutching, MD, by Karle Plumber, ED Scribe.  This patient was seen in room APFT24/APFT24 and the patient's care was started at 4:11 PM.  Chief Complaint  Patient presents with  . Back Pain  . Dental Pain    The history is provided by the patient. No language interpreter was used.   HPI Comments:  Heather [name] is a 53 y.o. female with h/o chronic back pain, back surgery and HTN who presents to the Emergency Department complaining of moderate tooth pain secondary to an abscess and moderate back pain onset one week ago. Pt reports presenting here last week for these symptoms and was prescribed Clindamycin and Vicodin in which she has been taking as prescribed. She states this morning she had a "pinching" sensation in her chest and light-headedness that lasted only a couple of seconds while she was washing dishes. She reports that she feels that her "body is full of infection" and states she feels tired and "woozy". She reports blisters, rash and pain in her vaginal area along with dysuria which started one week ago when she started taking the clindamycin.  She reports frequency of urination also. She reports chills, intermittent diarrhea with 6-7 episodes of watery, nonbloody stools for the past three days, and mild nausea. Pt reports painful, burning blisters and soreness around her pelvic area and rectum that started 6-7 days ago. She denies vaginal irritation but reports vaginal pain. She denies fever, appetite change or cough. She reports h/o kidney infection. She denies being sexually active in the past 5 years. Pt states she does not have a dentist. She states she [facility] to [facility] for her [facility]CP.   Past Medical History  Diagnosis Date  . Chronic back pain   . Hypertension   . Barrett's esophageal ulceration   . GI bleed    . Pancreatic lesion    Past Surgical History  Procedure Laterality Date  . Back surgery    . Abdominal surgery      at age 85 for malrotation; appendectomy  . Cesarean section    . Ventral hernia repair  2003  . Abdominal hysterectomy  2003    with Left Salpingo  . Laparoscopic incisional / umbilical / ventral hernia repair  2008  . Laparoscopic nissen fundoplication  2000  . Esophagogastroduodenoscopy  02/26/2012    Procedure: ESOPHAGOGASTRODUODENOSCOPY (EGD);  Surgeon: Petra Kuba, MD;  Location: [facility];  Service: Endoscopy;  Laterality: N/A;   No family history on file. History  Substance Use Topics  . Smoking status: Current Every Day Smoker -- 0.50 packs/day    Types: Cigarettes  . Smokeless tobacco: Not on file  . Alcohol Use: No   OB History   Grav Para Term Preterm Abortions TAB SAB Ect Mult Living                 Review of Systems  Constitutional: Positive for chills. Negative for fever and appetite change.  HENT: Positive for dental problem. Negative for facial swelling and sore throat.   Respiratory: Negative for cough and shortness of breath.   Cardiovascular: Negative for chest pain and leg swelling.  Gastrointestinal: Positive for nausea and diarrhea. Negative for abdominal pain, constipation and abdominal distention.  Genitourinary: Positive for dysuria and frequency. Negative for urgency, flank pain and difficulty urinating.  Musculoskeletal:  Positive for back pain. Negative for gait problem, joint swelling, neck pain and neck stiffness.  Skin: Positive for wound. Negative for rash.  Neurological: Positive for light-headedness. Negative for weakness and numbness.    Allergies  Penicillins  Home Medications   Prior to Admission medications   Medication Sig Start Date End Date Taking? Authorizing Provider  acetaminophen (TYLENOL) 500 MG tablet Take 1,000 mg by mouth every 4 (four) hours as needed for moderate pain.   Yes Historical Provider, MD   benzocaine (ORAJEL) 10 % mucosal gel Use as directed 1 application in the mouth or throat as needed for mouth pain.   Yes Historical Provider, MD  clindamycin (CLEOCIN) 300 MG capsule Take 1 capsule (300 mg total) by mouth 4 (four) times daily. 07/07/14  Yes Tammy L. Triplett, PA-C  lansoprazole (PREVACID) 30 MG capsule Take 30 mg by mouth daily at 12 noon.   Yes Historical Provider, MD  Multiple Vitamin (MULTIVITAMIN WITH MINERALS) TABS Take 1 tablet by mouth daily.   Yes Historical Provider, MD  HYDROcodone-acetaminophen (NORCO/VICODIN) 5-325 MG per tablet Take one-two tabs po q 4-6 hrs prn pain 07/07/14   Tammy L. Triplett, PA-C   Triage Vitals: BP 155/98  Pulse 112  Temp(Src) 99.2 F (37.3 C) (Oral)  Resp 20  Ht 5\' 5"  (1.651 m)  Wt 175 lb (79.379 kg)  BMI 29.12 kg/m2  SpO2 98% Physical Exam  Nursing note and vitals reviewed. Constitutional: She is oriented to person, place, and time. She appears well-developed and well-nourished. No distress.  HENT:  Head: Normocephalic and atraumatic.  Right Ear: Tympanic membrane and external ear normal.  Left Ear: Tympanic membrane and external ear normal.  Mouth/Throat: Oropharynx is clear and moist and mucous membranes are normal. No oral lesions. Dental abscesses present.  Edema along right lower second molar without erythema or drainage. No obvious dental abscess.  Eyes: Conjunctivae are normal.  Neck: Normal range of motion. Neck supple.  Cardiovascular: Normal rate, normal heart sounds and intact distal pulses.   Pedal pulses normal.  Pulmonary/Chest: Effort normal.  Abdominal: Soft. Bowel sounds are normal. She exhibits no distension and no mass. There is tenderness. There is no rebound and no guarding.  Some tenderness of suprapubic area without guarding or rebound.  Genitourinary: There is lesion on the right labia. Cervix exhibits no motion tenderness, no discharge and no friability. Right adnexum displays no mass, no tenderness and no  fullness. Left adnexum displays no mass, no tenderness and no fullness. No erythema around the vagina. Vaginal discharge found.  Macular lesion of perianal and perineum, erythematous,  Slightly scaly borders.  No satellite lesions.  She has 2 long, shallow appearing ulcerations with a moist, adherent yellow eschar along the anterior mucosa between the labia majora and minora. Vaginal discharge is white,  Mostly liquid with scant clumping.  Musculoskeletal: Normal range of motion. She exhibits no edema.       Lumbar back: She exhibits tenderness. She exhibits no swelling, no edema and no spasm.  Tender across sacrum. Well healed lumbar surgical incision.  Lymphadenopathy:    She has no cervical adenopathy.  Neurological: She is alert and oriented to person, place, and time. She has normal strength. She displays no atrophy and no tremor. No sensory deficit. Gait normal.  Reflex Scores:      Patellar reflexes are 2+ on the right side and 2+ on the left side.      Achilles reflexes are 2+ on the right side and  2+ on the left side. No strength deficit noted in hip and knee flexor and extensor muscle groups.  Ankle flexion and extension intact.  Skin: Skin is warm and dry. No erythema.  Psychiatric: She has a normal mood and affect.    ED Course  Procedures (including critical care time) DIAGNOSTIC STUDIES: Oxygen Saturation is 98% on RA, normal by my interpretation.   COORDINATION OF CARE: 4:27 PM- Will recheck some labs, perform urinalysis, and start an IV with fluids. Pt verbalizes understanding and agrees to plan.  Medications - No data to display  Labs Review Labs Reviewed - No data to display  Imaging Review No results found.   EKG Interpretation None      MDM   Final diagnoses:  None    Multiple symptoms including diarrhea, weakness, perineal rash, dysuria and low back pain since starting clindamycin 7 days ago for dental infection.  Herpes culture, gc/chlamydia pending.   Pt with external genitalia lesions suggesting herpes.  Will start on acyclovir.  She has undergone 7 days of clindamycin, dental infection appears resolved, although still with dental pain. Will treat with pain medication.  Encouraged rest, fluids.  Pt aware cultures are pending. desitin for perineal pain.  Planned f/u with pcp this week if sx are not improving.  She has also arranged dental care with the health dept.  I personally performed the services described in this documentation, which was scribed in my presence. The recorded information has been reviewed and is accurate.    Burgess Amor, PA-C 07/13/14 1744  Burgess Amor, PA-C 07/13/14 [phone]

## 2014-07-13 NOTE — ED Notes (Addendum)
C/o vaginal pain.  Medicated with morphine IVP as ordered.

## 2014-07-13 NOTE — ED Notes (Signed)
Patient reports being seen here on Monday for abscessed tooth. Patient states "I was also having some pain in my left leg but I didn't think anything about it because I have spinal fusion." Patient now states that her lower back and both her legs hurt. Patient still reports dental pain despite using her antibiotics. Patient now states that she has blisters in groin to buttock that are painful. Per patient has had chicken pox.

## 2014-07-13 NOTE — Discharge Instructions (Signed)
Diarrhea Diarrhea is watery poop (stool). It can make you feel weak, tired, thirsty, or give you a dry mouth (signs of dehydration). Watery poop is a sign of another problem, most often an infection. It often lasts 2-3 days. It can last longer if it is a sign of something serious. Take care of yourself as told by your doctor. HOME CARE   Drink 1 cup (8 ounces) of fluid each time you have watery poop.  Do not drink the following fluids:  Those that contain simple sugars (fructose, glucose, galactose, lactose, sucrose, maltose).  Sports drinks.  Fruit juices.  Whole milk products.  Sodas.  Drinks with caffeine (coffee, tea, soda) or alcohol.  Oral rehydration solution may be used if the doctor says it is okay. You may make your own solution. Follow this recipe:   - teaspoon table salt.   teaspoon baking soda.   teaspoon salt substitute containing potassium chloride.  1 tablespoons sugar.  1 liter (34 ounces) of water.  Avoid the following foods:  High fiber foods, such as raw fruits and vegetables.  Nuts, seeds, and whole grain breads and cereals.   Those that are sweetened with sugar alcohols (xylitol, sorbitol, mannitol).  Try eating the following foods:  Starchy foods, such as rice, toast, pasta, low-sugar cereal, oatmeal, baked potatoes, crackers, and bagels.  Bananas.  Applesauce.  Eat probiotic-rich foods, such as yogurt and milk products that are fermented.  Wash your hands well after each time you have watery poop.  Only take medicine as told by your doctor.  Take a warm bath to help lessen burning or pain from having watery poop. GET HELP RIGHT AWAY IF:   You cannot drink fluids without throwing up (vomiting).  You keep throwing up.  You have blood in your poop, or your poop looks black and tarry.  You do not pee (urinate) in 6-8 hours, or there is only a small amount of very dark pee.  You have belly (abdominal) pain that gets worse or stays  in the same spot (localizes).  You are weak, dizzy, confused, or light-headed.  You have a very bad headache.  Your watery poop gets worse or does not get better.  You have a fever or lasting symptoms for more than 2-3 days.  You have a fever and your symptoms suddenly get worse. MAKE SURE YOU:   Understand these instructions.  Will watch your condition.  Will get help right away if you are not doing well or get worse. Document Released: 05/23/2008 Document Revised: 04/21/2014 Document Reviewed: 08/12/2012 [facility] Patient Information 2015 [place], [place]. This information is not intended to replace advice given to you by your health care provider. Make sure you discuss any questions you have with your health care provider.  Genital Herpes Genital herpes is a sexually transmitted disease. This means that it is a disease passed by having sex with an infected person. There is no cure for genital herpes. The time between attacks can be months to years. The virus may live in a person but produce no problems (symptoms). This infection can be passed to a baby as it travels down the birth canal (vagina). In a newborn, this can cause central nervous system damage, eye damage, or even death. The virus that causes genital herpes is usually HSV-2 virus. The virus that causes oral herpes is usually HSV-1. The diagnosis (learning what is wrong) is made through culture results. SYMPTOMS  Usually symptoms of pain and itching begin a few  days to a week after contact. It first appears as small blisters that progress to small painful ulcers which then scab over and heal after several days. It affects the outer genitalia, birth canal, cervix, penis, anal area, buttocks, and thighs. HOME CARE INSTRUCTIONS   Keep ulcerated areas dry and clean.  Take medications as directed. Antiviral medications can speed up healing. They will not prevent recurrences or cure this infection. These medications can also be  taken for suppression if there are frequent recurrences.  While the infection is active, it is contagious. Avoid all sexual contact during active infections.  Condoms may help prevent spread of the herpes virus.  Practice safe sex.  Wash your hands thoroughly after touching the genital area.  Avoid touching your eyes after touching your genital area.  Inform your caregiver if you have had genital herpes and become pregnant. It is your responsibility to insure a safe outcome for your baby in this pregnancy.  Only take over-the-counter or prescription medicines for pain, discomfort, or fever as directed by your caregiver. SEEK MEDICAL CARE IF:   You have a recurrence of this infection.  You do not respond to medications and are not improving.  You have new sources of pain or discharge which have changed from the original infection.  You have an oral temperature above 102 F (38.9 C).  You develop abdominal pain.  You develop eye pain or signs of eye infection. Document Released: 12/02/2000 Document Revised: 02/27/2012 Document Reviewed: 12/23/2009 [facility] Patient Information 2015 [place]. This information is not intended to replace advice given to you by your health care provider. Make sure you discuss any questions you have with your health care provider.  Take the medicines prescribed for continued pain and your vaginal rash.  You are being covered for both the possibilities of a primary herpes infection, but acyclovir should also be helpful if this is an atypical shingles presentation.  Stop taking your antibiotic which is most likely the source of your diarrhea.  Drink plenty of fluids.  Rest.  The diarrhea should improve over the next 1-2 days.  You may consider eating yogurt with active yeast cultures which can help this get better quicker.  You may also want to consider using desitin diaper cream on your rash which should help a lot with the discomfort.  Get rechecked by  your doctor this week.

## 2014-07-14 LAB — RPR

## 2014-07-14 LAB — HIV ANTIBODY (ROUTINE TESTING W REFLEX): HIV 1&2 Ab, 4th Generation: NONREACTIVE

## 2014-07-14 NOTE — ED Provider Notes (Signed)
Medical screening examination/treatment/procedure(s) were performed by non-physician practitioner and as supervising physician I was immediately available for consultation/collaboration.   EKG Interpretation None       Donnetta HutchingBrian Shaylene Paganelli, MD 07/14/14 [phone]

## 2014-07-15 LAB — GC/CHLAMYDIA PROBE AMP
CT Probe RNA: NEGATIVE
GC Probe RNA: NEGATIVE

## 2014-07-16 LAB — HERPES SIMPLEX VIRUS CULTURE: CULTURE: DETECTED

## 2014-07-17 ENCOUNTER — Telehealth (HOSPITAL_COMMUNITY): Payer: Self-pay

## 2014-07-17 NOTE — ED Notes (Signed)
Positive herpes simplex type 2 culture. Treated approp. No med changes needed.

## 2014-07-18 NOTE — ED Provider Notes (Signed)
Medical screening examination/treatment/procedure(s) were performed by non-physician practitioner and as supervising physician I was immediately available for consultation/collaboration.   EKG Interpretation None        Lanny Lipkin, MD 07/18/14 0012 

## 2014-07-30 ENCOUNTER — Emergency Department (HOSPITAL_BASED_OUTPATIENT_CLINIC_OR_DEPARTMENT_OTHER)
Admission: EM | Admit: 2014-07-30 | Discharge: 2014-07-30 | Disposition: A | Discharge status: Home / Self Care | Payer: Self-pay | Attending: Emergency Medicine | Admitting: Emergency Medicine

## 2014-07-30 ENCOUNTER — Encounter (HOSPITAL_BASED_OUTPATIENT_CLINIC_OR_DEPARTMENT_OTHER): Payer: Self-pay | Admitting: Emergency Medicine

## 2014-07-30 DIAGNOSIS — G8929 Other chronic pain: Secondary | ICD-10-CM | POA: Insufficient documentation

## 2014-07-30 DIAGNOSIS — Z79899 Other long term (current) drug therapy: Secondary | ICD-10-CM | POA: Insufficient documentation

## 2014-07-30 DIAGNOSIS — F17200 Nicotine dependence, unspecified, uncomplicated: Secondary | ICD-10-CM | POA: Insufficient documentation

## 2014-07-30 DIAGNOSIS — Z8719 Personal history of other diseases of the digestive system: Secondary | ICD-10-CM | POA: Insufficient documentation

## 2014-07-30 DIAGNOSIS — K089 Disorder of teeth and supporting structures, unspecified: Secondary | ICD-10-CM | POA: Insufficient documentation

## 2014-07-30 DIAGNOSIS — K0889 Other specified disorders of teeth and supporting structures: Secondary | ICD-10-CM

## 2014-07-30 DIAGNOSIS — Z880 Allergy status to penicillin: Secondary | ICD-10-CM | POA: Insufficient documentation

## 2014-07-30 DIAGNOSIS — K029 Dental caries, unspecified: Secondary | ICD-10-CM | POA: Insufficient documentation

## 2014-07-30 DIAGNOSIS — I10 Essential (primary) hypertension: Secondary | ICD-10-CM | POA: Insufficient documentation

## 2014-07-30 MED ORDER — CLINDAMYCIN HCL 150 MG PO CAPS
300.0000 mg | ORAL_CAPSULE | Freq: Once | ORAL | Status: AC
  Administered 2014-07-30: 300 mg via ORAL
  Filled 2014-07-30: qty 2

## 2014-07-30 MED ORDER — CLINDAMYCIN HCL 300 MG PO CAPS: 300.0000 mg | ORAL_CAPSULE | Freq: Four times a day (QID) | ORAL | Status: DC

## 2014-07-30 MED ORDER — OXYCODONE-ACETAMINOPHEN 5-325 MG PO TABS
1.0000 | ORAL_TABLET | Freq: Once | ORAL | Status: AC
  Administered 2014-07-30: 1 via ORAL
  Filled 2014-07-30: qty 1

## 2014-07-30 MED ORDER — HYDROCODONE-ACETAMINOPHEN 5-325 MG PO TABS: 1.0000 | ORAL_TABLET | Freq: Four times a day (QID) | ORAL | Status: DC | PRN

## 2014-07-30 NOTE — Discharge Instructions (Signed)
°Emergency Department Resource Guide °1) Find a Doctor and Pay Out of Pocket °Although you won't have to find out who is covered by your insurance plan, it is a good idea to ask around and get recommendations. You will then need to call the office and see if the doctor you have chosen will accept you as a new patient and what types of options they offer for patients who are self-pay. Some doctors offer discounts or will set up payment plans for their patients who do not have insurance, but you will need to ask so you aren't surprised when you get to your appointment. ° °2) Contact Your Local Health Department °Not all health departments have doctors that can see patients for sick visits, but many do, so it is worth a call to see if yours does. If you don't know where your local health department is, you can check in your phone book. The CDC also has a tool to help you locate your state's health department, and many state websites also have listings of all of their local health departments. ° °3) Find a Walk-in Clinic °If your illness is not likely to be very severe or complicated, you may want to try a walk in clinic. These are popping up all over the country in pharmacies, drugstores, and shopping centers. They're usually staffed by nurse practitioners or physician assistants that have been trained to treat common illnesses and complaints. They're usually fairly quick and inexpensive. However, if you have serious medical issues or chronic medical problems, these are probably not your best option. ° °No Primary Care Doctor: °- Call [facility] Connect at  832-8000 - they can help you locate a primary care doctor that  accepts your insurance, provides certain services, etc. °- Physician Referral Service- 1-800-533-3463 ° °Chronic Pain Problems: °Organization         Address  Phone   Notes  °[facility]  (336) 297-2271 Patients need to be referred by their primary care doctor.  ° °Medication  Assistance: °Organization         Address  Phone   Notes  °[place] [facility] [address] °[place], [place] [address] (336) 641-8030 --Must be a resident of [place] °-- Must have NO insurance coverage whatsoever (no Medicaid/ Medicare, etc.) °-- The pt. MUST have a primary care doctor that directs their care regularly and follows them in the community °  °[facility]  (866) 331-1348   °[employer]  (888) 892-1162   ° °Agencies that provide inexpensive medical care: °Organization         Address  Phone   Notes  °[facility]  (336) 832-8035   ° Internal Medicine    (336) 832-7272   °[facility] [address] °[place], [place] [address] (336) 832-4777   °[facility] [address], °[place] (336) 271-4999   °[facility]    (336) 373-0678   °[facility]    (336) 272-1050   °[facility] ° [address], [place] Phone:  (336) 832-4444, Fax:  (336) 832-4440 Hours of Operation:  9 am - 6 pm, M-F.  Also accepts Medicaid/Medicare and self-pay.  °[facility] ° [address], [place] Phone: (336) 832-3150, Fax: (336) [phone]. Hours of Operation:  8:30 am - 5:30 pm, M-F.  Also accepts Medicaid and self-pay.  °[facility] [place] [address], [place] Phone: (336) 878-6027   °[facility] [address], [place], [place] (336)723-1848, Ext. 123 Mondays & Thursdays: 7-9 AM.  First 15 patients are seen on a first come, first serve basis. °  ° °Medicaid-accepting [place] Providers: ° °Organization         Address  Phone   Notes  °[facility] [address], [place] (336) 641-2100 Also accepts self-pay patients.  °[facility] [address], [place] ° (336) 856-9996   °[facility] [address], [place]  (336) 288-8857   °[facility] [address] [place] [address], [place] (336) 299-7000   °Veita Bland [address], [place]  ° (336) 373-1557 Only accepts [place] Access Medicaid patients after they have their name applied to their card.  ° °Self-Pay (no insurance) in [place]: ° °Organization         Address  Phone   Notes  °Sickle Cell Patients, [facility] [address], [place] (336) 832-1970   °[facility] [address], [place] (336) 832-4400   °[facility] ° [address], [place] (336) 992-4800   °[facility]/Dr. Osei-Bonsu ° [address] [place] [address], [place] or [address], [place] (336) 841-8500 Phone number for both [place] and [place] locations is the same.  °[facility] [address], [place] (336) 299-0000   °[facility] [address] [place] [address], [place] or [address] (336) 852-7530 °(336) 878-2260   °[facility] [address], [place] (336) 350-1642, phone; (336) 294-5005, fax Sees patients 1st and 3rd Saturday of every month.  Must not qualify for public or private insurance (i.e. Medicaid, Medicare, [place] Health Choice, Veterans' Benefits) • Household income should be no more than 200% of the poverty level •The clinic cannot treat you if you are pregnant or think you are pregnant • Sexually transmitted diseases are not treated at the clinic.  ° ° °Dental Care: °Organization         Address  Phone  Notes  °[place] [facility] [address], [place] (336) 641-6152 Accepts children up to age 21 who are enrolled in Medicaid or [place] Health Choice; pregnant women with a Medicaid card; and children who have applied for Medicaid or [place] Health Choice, but were declined, whose parents can pay a reduced fee at time of service.  °[place]  [facility] [place]  [address], [place] (336) 641-7733 Accepts children up to age 21 who are enrolled in Medicaid or [place] Health Choice; pregnant women with a Medicaid card; and children who have applied for Medicaid or [place] Health Choice, but were declined, whose parents can pay a reduced fee at time of service.  °[facility] ° [address], [place] (336) 641-4533 Patients are seen by appointment only. Walk-ins are not accepted. [facility] will see patients 18 years of age and older. °Monday - Tuesday (8am-5pm) °Most Wednesdays (8:30-5pm) °$30 per visit, cash only  °[facility] ° [address], [place] (336) 641-4533 Patients are seen by appointment only. Walk-ins are not accepted. [facility] will see patients 18 years of age and older. °One   Wednesday Evening (Monthly: Volunteer Based).  $30 per visit, cash only  °[facility]  (919) 537-3737 for adults; Children under age 4, call Graduate Pediatric Dentistry at ([phone]) [phone]. Children aged 4-14, please call (919) [phone] to request a pediatric application. ° Dental services are provided in all areas of dental care including fillings, crowns and bridges, complete and partial dentures, implants, gum treatment, root canals, and extractions. Preventive care is also provided. Treatment is provided to both adults and children. °Patients are selected via a lottery and there is often a waiting list. °  °[facility] [address], °[place] ° (336) 763-8833 [email or web address] °  °[facility] [address], [place], [place] (336)723-1848, Ext. 123 Second and Fourth Thursday of each month, opens at 6:30 AM; Clinic ends at 9 AM.  Patients are seen on a first-come first-served basis, and a limited number are seen during each clinic.  ° °[facility] ° [address], [place], [place] (336) 723-7904    Eligibility Requirements °You must have lived in [place], or [place] counties for at least the last three months. °  You cannot be eligible for state or federal sponsored healthcare insurance, including Veterans Administration, Medicaid, or Medicare. °  You generally cannot be eligible for healthcare insurance through your employer.  °  How to apply: °Eligibility screenings are held every Tuesday and Wednesday afternoon from 1:00 pm until 4:00 pm. You do not need an appointment for the interview!  °[facility] [address], [place], [place] 336-631-2330   °[place] [facility]  336-342-8273   °[place]  336-703-3100   °[facility]  336-570-6415   ° °Behavioral Health Resources in the Community: °Intensive Outpatient Programs °Organization         Address  Phone  Notes  °[facility] [address], [place], [place] 336-878-6098   °[facility] [address], [place], [place] 336-832-9800   °[facility] [address], [place], [place] ° 336-882-2125   °[facility] [address],  °[place], [place] 1-800-853-5163 or 336-641-4981   °Substance Abuse Resources °Organization         Address  Phone  Notes  °[facility]  336-882-2125   °[facility]  336-784-9470   °[facility]  336-285-9073   °[facility]  336-845-3988   °Residential & Outpatient Substance Abuse Program  1-800-659-3381   °Psychological Services °Organization         Address  Phone  Notes  °[facility]  336- 832-9600   °[facility]  336- 378-7881   °[facility] [address], [place] 1-800-853-5163 or 336-641-4981   ° °Mobile Crisis Teams °Organization         Address  Phone  Notes  °[facility], Mobile Crisis Care Unit  1-877-626-1772   °Assertive °[facility] ° [address]  [place], [place] 336-834-9664   °Sharon DeEsch [address] °[place] 336-554-5454   ° °Self-Help/Support Groups °Organization         Address  Phone             Notes  °[facility] [place] - variety of support groups  336- 373-1402 Call for more information  °Narcotics Anonymous (NA), [facility] [address], °[place]  2 meetings at this location  ° °  Residential Treatment Programs °Organization         Address  Phone  Notes  °[facility] [address],    °[place]  1-866-801-8205   °[facility] ° [address], [place], [place] 704-293-8524   °[facility] [address], [place] 336-845-3988 Admissions: 8am-3pm M-F  °[facility] [address]    °[place], [place] 336-841-1104   °[facility] [address], [place], [place] 336-379-7146   °[facility] [address]  °[place], [place] 336-285-9073   °[facility] - Intensive Outpatient [address], [place], [place] 336-852-3033   °[facility] [address]  °[place], [place] 1-877-615-2722 or 336-784-9470   °[facility] [address] [place], [place] 336-227-7417 Accepts Medicaid  °[facility] [address]  °[place] 1-800-659-3381 Substance Abuse/Addiction Treatment  ° °[place] Behavioral Health Resources °Organization         Address  Phone  Notes  °[facility]  (888) 581-9988   °Julie Brannon, PhD [address] [place], [place]   (336) 349-5553 or (336) 951-0000   °[facility]   [address] °[place], [place] (336) 349-4454   °[facility] [address], [place], [place] (336) 342-8316 Insurance/Medicaid/sponsorship through [facility]  °[facility] [address] [place] [address]                                    [place], [place] (336) 342-8316 Therapy/tele-psych/case    °[facility] [address]  ° [place], [place] (336) 349-2233    °Dr. Arfeen  (336) 349-4544   °[facility] [place]  [employer] [place] [facility] 1) [address], [place] °2) [address], [place] °3)  [address], [place] (336) 349-3220 °(336) 342-7768 ° °(336) 342-8140   °[place] [facility] (336) 342-1394 or (336) 342-3537 (After Hours)    ° ° °

## 2014-07-30 NOTE — ED Notes (Signed)
Dental pain x 1 week.  

## 2014-07-30 NOTE — ED Provider Notes (Signed)
CSN: [id number]     Arrival date & time 07/30/14  1036 History   First MD Initiated Contact with Patient 07/30/14 1043     Chief Complaint  Patient presents with  . Dental Pain      HPI Patient reports worsening right sided dental pain over the last week.  She states she struggle with these same teeth before.  She does not have a dentist.  She does not have dental insurance or the money to pay out-of-pocket for her dentist.  If fevers or chills.  No facial swelling.  No difficulty breathing or swallowing.  No other complaint   Past Medical History  Diagnosis Date  . Chronic back pain   . Hypertension   . Barrett's esophageal ulceration   . GI bleed   . Pancreatic lesion    Past Surgical History  Procedure Laterality Date  . Back surgery    . 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 Cesarean section    . Ventral hernia repair  2003  . Abdominal hysterectomy  2003    with Left Salpingo  . Laparoscopic incisional / umbilical / ventral hernia repair  2008  . Laparoscopic nissen fundoplication  2000  . Esophagogastroduodenoscopy  02/26/2012    Procedure: ESOPHAGOGASTRODUODENOSCOPY (EGD);  Surgeon: Petra KubaMarc E Magod, MD;  [facility];  Service: Endoscopy;  Laterality: N/A;   No family history on file. History  Substance Use Topics  . Smoking status: Current Every Day Smoker -- 0.50 packs/day    Types: Cigarettes  . Smokeless tobacco: Not on file  . Alcohol Use: No   OB History   Grav Para Term Preterm Abortions TAB SAB Ect Mult Living                 Review of Systems  All other systems reviewed and are negative.     Allergies  Penicillins  Home Medications   Prior to Admission medications   Medication Sig Start Date End Date Taking? Authorizing Provider  acetaminophen (TYLENOL) 500 MG tablet Take 1,000 mg by mouth every 4 (four) hours as needed for moderate pain.    Historical Provider, MD  acyclovir (ZOVIRAX) 200 MG capsule Take  2 capsules (400 mg total) by mouth 3 (three) times daily. 07/13/14   Burgess AmorJulie Idol, PA-C  benzocaine (ORAJEL) 10 % mucosal gel Use as directed 1 application in the mouth or throat as needed for mouth pain.    Historical Provider, MD  clindamycin (CLEOCIN) 300 MG capsule Take 1 capsule (300 mg total) by mouth 4 (four) times daily. 07/07/14   Tammy L. Triplett, PA-C  clindamycin (CLEOCIN) 300 MG capsule Take 1 capsule (300 mg total) by mouth every 6 (six) hours. 07/30/14   Lyanne CoKevin M Caree Wolpert, MD  HYDROcodone-acetaminophen (NORCO/VICODIN) 5-325 MG per tablet Take one-two tabs po q 4-6 hrs prn pain 07/07/14   Tammy L. Triplett, PA-C  HYDROcodone-acetaminophen (NORCO/VICODIN) 5-325 MG per tablet Take 1 tablet by mouth every 6 (six) hours as needed for moderate pain. 07/30/14   Lyanne CoKevin M Halie Gass, MD  lansoprazole (PREVACID) 30 MG capsule Take 30 mg by mouth daily at 12 noon.    Historical Provider, MD  Multiple Vitamin (MULTIVITAMIN WITH MINERALS) TABS Take 1 tablet by mouth daily.    Historical Provider, MD  oxyCODONE-acetaminophen (PERCOCET/ROXICET) 5-325 MG per tablet Take 1 tablet by mouth every 4 (four) hours as needed. 07/13/14   Burgess AmorJulie Idol, PA-C   BP 159/89  Pulse 86  Temp(Src) 98.9 F (37.2 C) (Oral)  Resp 16  Wt 175 lb (79.379 kg)  SpO2 98% Physical Exam  Nursing note and vitals reviewed. Constitutional: She is oriented to person, place, and time. She appears well-developed and well-nourished.  HENT:  Head: Normocephalic.  Dental decay with evidence of tenderness and discomfort at tooth #31 and tooth #4.  No gingival swelling or fluctuance.  Tolerating secretions.  Space under the tongue is soft  Eyes: EOM are normal.  Neck: Normal range of motion.  Pulmonary/Chest: Effort normal.  Abdominal: She exhibits no distension.  Musculoskeletal: Normal range of motion.  Neurological: She is alert and oriented to person, place, and time.  Psychiatric: She has a normal mood and affect.    ED Course   Procedures (including critical care time) Labs Review Labs Reviewed - No data to display  Imaging Review No results found.   EKG Interpretation None      MDM   Final diagnoses:  Pain, dental    Dental Pain. Home with antibiotics and pain medicine. Recommend dental follow up. No signs of gingival abscess. Tolerating secretions. Airway patent. No sub lingular swelling     Lyanne Co, MD 07/30/14 1052

## 2014-09-02 ENCOUNTER — Encounter (HOSPITAL_BASED_OUTPATIENT_CLINIC_OR_DEPARTMENT_OTHER): Payer: Self-pay | Admitting: Emergency Medicine

## 2014-09-02 ENCOUNTER — Emergency Department (HOSPITAL_BASED_OUTPATIENT_CLINIC_OR_DEPARTMENT_OTHER)
Admission: EM | Admit: 2014-09-02 | Discharge: 2014-09-02 | Disposition: A | Discharge status: Home / Self Care | Payer: Self-pay | Attending: Emergency Medicine | Admitting: Emergency Medicine

## 2014-09-02 DIAGNOSIS — I10 Essential (primary) hypertension: Secondary | ICD-10-CM | POA: Insufficient documentation

## 2014-09-02 DIAGNOSIS — F17200 Nicotine dependence, unspecified, uncomplicated: Secondary | ICD-10-CM | POA: Insufficient documentation

## 2014-09-02 DIAGNOSIS — Z79899 Other long term (current) drug therapy: Secondary | ICD-10-CM | POA: Insufficient documentation

## 2014-09-02 DIAGNOSIS — K029 Dental caries, unspecified: Secondary | ICD-10-CM | POA: Insufficient documentation

## 2014-09-02 DIAGNOSIS — Z880 Allergy status to penicillin: Secondary | ICD-10-CM | POA: Insufficient documentation

## 2014-09-02 DIAGNOSIS — K089 Disorder of teeth and supporting structures, unspecified: Secondary | ICD-10-CM | POA: Insufficient documentation

## 2014-09-02 DIAGNOSIS — G8929 Other chronic pain: Secondary | ICD-10-CM | POA: Insufficient documentation

## 2014-09-02 MED ORDER — CLINDAMYCIN HCL 300 MG PO CAPS: 300.0000 mg | ORAL_CAPSULE | Freq: Four times a day (QID) | ORAL | Status: DC

## 2014-09-02 MED ORDER — HYDROCODONE-ACETAMINOPHEN 5-325 MG PO TABS: 2.0000 | ORAL_TABLET | ORAL | Status: DC | PRN

## 2014-09-02 NOTE — ED Notes (Signed)
Cracked tooth R side bottom, jaw pain

## 2014-09-02 NOTE — ED Provider Notes (Signed)
CSN: [id number]     Arrival date & time 09/02/14  4098 History   First MD Initiated Contact with Patient 09/02/14 [phone]     Chief Complaint  Patient presents with  . Dental Pain     (Consider location/radiation/quality/duration/timing/severity/associated sxs/prior Treatment) HPI Comments: Patient presents with tooth pain. She's had recurrent pain to her right lower back molar for about 2-3 months. She has an appointment to see a dentist to have the tooth extracted in 3 weeks. She's requesting some antibiotics and pain medicine until then. She was seen in the ER about a month ago and states that the antibiotics seemed to help her discomfort. She denies any fevers or vomiting.  Patient is a 53 y.o. female presenting with tooth pain.  Dental Pain Associated symptoms: no facial swelling, no fever and no headaches     Past Medical History  Diagnosis Date  . Chronic back pain   . Hypertension   . Barrett's esophageal ulceration   . GI bleed   . Pancreatic lesion    Past Surgical History  Procedure Laterality Date  . Back surgery    . 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 Cesarean section    . Ventral hernia repair  2003  . Abdominal hysterectomy  2003    with Left Salpingo  . Laparoscopic incisional / umbilical / ventral hernia repair  2008  . Laparoscopic nissen fundoplication  2000  . Esophagogastroduodenoscopy  02/26/2012    Procedure: ESOPHAGOGASTRODUODENOSCOPY (EGD);  Surgeon: Petra Kuba, MD;  Location: [facility];  Service: Endoscopy;  Laterality: N/A;   No family history on file. History  Substance Use Topics  . Smoking status: Current Every Day Smoker -- 0.50 packs/day    Types: Cigarettes  . Smokeless tobacco: Not on file  . Alcohol Use: No   OB History   Grav Para Term Preterm Abortions TAB SAB Ect Mult Living                 Review of Systems  Constitutional: Negative for fever.  HENT: Positive for dental problem. Negative for  facial swelling.   Gastrointestinal: Negative for nausea and vomiting.  Skin: Negative for wound.  Neurological: Negative for dizziness and headaches.      Allergies  Penicillins  Home Medications   Prior to Admission medications   Medication Sig Start Date End Date Taking? Authorizing Provider  acetaminophen (TYLENOL) 500 MG tablet Take 1,000 mg by mouth every 4 (four) hours as needed for moderate pain.    Historical Provider, MD  acyclovir (ZOVIRAX) 200 MG capsule Take 2 capsules (400 mg total) by mouth 3 (three) times daily. 07/13/14   Burgess Amor, PA-C  benzocaine (ORAJEL) 10 % mucosal gel Use as directed 1 application in the mouth or throat as needed for mouth pain.    Historical Provider, MD  clindamycin (CLEOCIN) 300 MG capsule Take 1 capsule (300 mg total) by mouth 4 (four) times daily. 07/07/14   Tammy L. Triplett, PA-C  clindamycin (CLEOCIN) 300 MG capsule Take 1 capsule (300 mg total) by mouth every 6 (six) hours. 07/30/14   Lyanne Co, MD  clindamycin (CLEOCIN) 300 MG capsule Take 1 capsule (300 mg total) by mouth 4 (four) times daily. X 7 days 09/02/14   Rolan Bucco, MD  HYDROcodone-acetaminophen (NORCO/VICODIN) 5-325 MG per tablet Take one-two tabs po q 4-6 hrs prn pain 07/07/14   Tammy L. Triplett, PA-C  HYDROcodone-acetaminophen (NORCO/VICODIN) 5-325  MG per tablet Take 1 tablet by mouth every 6 (six) hours as needed for moderate pain. 07/30/14   Lyanne Co, MD  HYDROcodone-acetaminophen (NORCO/VICODIN) 5-325 MG per tablet Take 2 tablets by mouth every 4 (four) hours as needed for moderate pain or severe pain. 09/02/14   Rolan Bucco, MD  lansoprazole (PREVACID) 30 MG capsule Take 30 mg by mouth daily at 12 noon.    Historical Provider, MD  Multiple Vitamin (MULTIVITAMIN WITH MINERALS) TABS Take 1 tablet by mouth daily.    Historical Provider, MD  oxyCODONE-acetaminophen (PERCOCET/ROXICET) 5-325 MG per tablet Take 1 tablet by mouth every 4 (four) hours as needed. 07/13/14    Burgess Amor, PA-C   BP 161/92  Pulse 85  Temp(Src) 98.3 F (36.8 C) (Oral)  Resp 20  Ht  (1.651 m)  Wt 169 lb (76.658 kg)  BMI 28.12 kg/m2  SpO2 97% Physical Exam  Constitutional: She is oriented to person, place, and time. She appears well-developed and well-nourished.  HENT:  Head: Normocephalic and atraumatic.  Markedly decayed teeth. There's a decayed tooth and surrounding tenderness involving the right lower back molar. There's multiple missing teeth. There's no induration or fluctuance around the tooth. No trismus. No facial swelling.  Cardiovascular: Normal rate.   Pulmonary/Chest: Effort normal.  Neurological: She is alert and oriented to person, place, and time.  Skin: Skin is warm and dry.    ED Course  Procedures (including critical care time) Labs Review Labs Reviewed - No data to display  Imaging Review No results found.   EKG Interpretation None      MDM   Final diagnoses:  Dental caries    Patient is started on clindamycin and Vicodin for pain. She was advised to followup with her dentist at her currently scheduled appointment and return here as needed for any worsening symptoms.    Rolan Bucco, MD 09/02/14 [phone]

## 2014-09-02 NOTE — Discharge Instructions (Signed)

## 2014-10-03 ENCOUNTER — Other Ambulatory Visit: Payer: Self-pay

## 2014-11-09 ENCOUNTER — Emergency Department (HOSPITAL_BASED_OUTPATIENT_CLINIC_OR_DEPARTMENT_OTHER)
Admission: EM | Admit: 2014-11-09 | Discharge: 2014-11-09 | Disposition: A | Discharge status: Home / Self Care | Payer: Self-pay | Attending: Emergency Medicine | Admitting: Emergency Medicine

## 2014-11-09 ENCOUNTER — Encounter (HOSPITAL_BASED_OUTPATIENT_CLINIC_OR_DEPARTMENT_OTHER): Payer: Self-pay | Admitting: *Deleted

## 2014-11-09 DIAGNOSIS — K0889 Other specified disorders of teeth and supporting structures: Secondary | ICD-10-CM

## 2014-11-09 DIAGNOSIS — G8929 Other chronic pain: Secondary | ICD-10-CM | POA: Insufficient documentation

## 2014-11-09 DIAGNOSIS — Z880 Allergy status to penicillin: Secondary | ICD-10-CM | POA: Insufficient documentation

## 2014-11-09 DIAGNOSIS — Z79899 Other long term (current) drug therapy: Secondary | ICD-10-CM | POA: Insufficient documentation

## 2014-11-09 DIAGNOSIS — Z792 Long term (current) use of antibiotics: Secondary | ICD-10-CM | POA: Insufficient documentation

## 2014-11-09 DIAGNOSIS — K088 Other specified disorders of teeth and supporting structures: Secondary | ICD-10-CM | POA: Insufficient documentation

## 2014-11-09 DIAGNOSIS — I10 Essential (primary) hypertension: Secondary | ICD-10-CM | POA: Insufficient documentation

## 2014-11-09 DIAGNOSIS — Z720 Tobacco use: Secondary | ICD-10-CM | POA: Insufficient documentation

## 2014-11-09 MED ORDER — HYDROCODONE-ACETAMINOPHEN 5-325 MG PO TABS: 1.0000 | ORAL_TABLET | Freq: Four times a day (QID) | ORAL | Status: DC | PRN

## 2014-11-09 MED ORDER — HYDROCODONE-ACETAMINOPHEN 5-325 MG PO TABS
1.0000 | ORAL_TABLET | Freq: Once | ORAL | Status: AC
  Administered 2014-11-09: 1 via ORAL
  Filled 2014-11-09: qty 1

## 2014-11-09 NOTE — Discharge Instructions (Signed)
Please follow up with your dentist on Tuesday for reassessment of your recent dental extraction.    Dental Pain A tooth ache may be caused by cavities (tooth decay). Cavities expose the nerve of the tooth to air and hot or cold temperatures. It may come from an infection or abscess (also called a boil or furuncle) around your tooth. It is also often caused by dental caries (tooth decay). This causes the pain you are having. DIAGNOSIS  Your caregiver can diagnose this problem by exam. TREATMENT   If caused by an infection, it may be treated with medications which kill germs (antibiotics) and pain medications as prescribed by your caregiver. Take medications as directed.  Only take over-the-counter or prescription medicines for pain, discomfort, or fever as directed by your caregiver.  Whether the tooth ache today is caused by infection or dental disease, you should see your dentist as soon as possible for further care. SEEK MEDICAL CARE IF: The exam and treatment you received today has been provided on an emergency basis only. This is not a substitute for complete medical or dental care. If your problem worsens or new problems (symptoms) appear, and you are unable to meet with your dentist, call or return to this location. SEEK IMMEDIATE MEDICAL CARE IF:   You have a fever.  You develop redness and swelling of your face, jaw, or neck.  You are unable to open your mouth.  You have severe pain uncontrolled by pain medicine. MAKE SURE YOU:   Understand these instructions.  Will watch your condition.  Will get help right away if you are not doing well or get worse. Document Released: 12/05/2005 Document Revised: 02/27/2012 Document Reviewed: 07/23/2008 [facility] Patient Information 2015 [place]. This information is not intended to replace advice given to you by your health care provider. Make sure you discuss any questions you have with your health care provider.

## 2014-11-09 NOTE — ED Notes (Signed)
States that she had two teeth pulled on Friday and now she is having pain on the whole side of her face, right side

## 2014-11-09 NOTE — ED Provider Notes (Signed)
CSN: [id number]     Arrival date & time 11/09/14  1541 History   First MD Initiated Contact with Patient 11/09/14 1600     Chief Complaint  Patient presents with  . Dental Pain     (Consider location/radiation/quality/duration/timing/severity/associated sxs/prior Treatment) HPI   53 year old female with history of chronic back pain, history of GI bleed and history of recurrent visits to ER for dental pain who presents complaining of dental pain. Patient reports she has to for teeth pulled on Friday and now she is having a lot of pain to the site of the dental extraction. Patient states she has had dental extraction by the same group in the past without any complication however she has a new dentist that pulled both teeth 2 days ago. Since the dental extraction her pain has been unrelenting. She described as a sharp throbbing pain that is persistent and causing facial swelling. No complaints of fever, trouble swallowing, neck pain, or rash. Patient is allergic to penicillin. She does not have any pain medication. Patient states the next time that she see her dentist is 2 days from now.  Past Medical History  Diagnosis Date  . Chronic back pain   . Hypertension   . Barrett's esophageal ulceration   . GI bleed   . Pancreatic lesion    Past Surgical History  Procedure Laterality Date  . Back surgery    . 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 Cesarean section    . Ventral hernia repair  2003  . Abdominal hysterectomy  2003    with Left Salpingo  . Laparoscopic incisional / umbilical / ventral hernia repair  2008  . Laparoscopic nissen fundoplication  2000  . Esophagogastroduodenoscopy  02/26/2012    Procedure: ESOPHAGOGASTRODUODENOSCOPY (EGD);  Surgeon: Petra KubaMarc E Magod, MD;  [facility];  Service: Endoscopy;  Laterality: N/A;   No family history on file. History  Substance Use Topics  . Smoking status: Current Every Day Smoker -- 0.50 packs/day     Types: Cigarettes  . Smokeless tobacco: Not on file  . Alcohol Use: No   OB History    No data available     Review of Systems  Constitutional: Negative for fever.  HENT: Positive for dental problem.   Skin: Positive for wound. Negative for rash.      Allergies  Penicillins  Home Medications   Prior to Admission medications   Medication Sig Start Date End Date Taking? Authorizing Provider  HYDROcodone-homatropine (HYCODAN) 5-1.5 MG/5ML syrup Take 5 mLs by mouth every 6 (six) hours as needed for cough.   Yes Historical Provider, MD  lansoprazole (PREVACID) 15 MG capsule Take 15 mg by mouth daily at 12 noon.   Yes Historical Provider, MD  lisinopril (PRINIVIL,ZESTRIL) 10 MG tablet Take 10 mg by mouth daily.   Yes Historical Provider, MD  acetaminophen (TYLENOL) 500 MG tablet Take 1,000 mg by mouth every 4 (four) hours as needed for moderate pain.    Historical Provider, MD  acyclovir (ZOVIRAX) 200 MG capsule Take 2 capsules (400 mg total) by mouth 3 (three) times daily. 07/13/14   Burgess AmorJulie Idol, PA-C  benzocaine (ORAJEL) 10 % mucosal gel Use as directed 1 application in the mouth or throat as needed for mouth pain.    Historical Provider, MD  clindamycin (CLEOCIN) 300 MG capsule Take 1 capsule (300 mg total) by mouth 4 (four) times daily. 07/07/14   Tammy L. Triplett, PA-C  clindamycin (CLEOCIN) 300 MG capsule Take 1 capsule (300 mg total) by mouth every 6 (six) hours. 07/30/14   Lyanne CoKevin M Campos, MD  clindamycin (CLEOCIN) 300 MG capsule Take 1 capsule (300 mg total) by mouth 4 (four) times daily. X 7 days 09/02/14   Rolan BuccoMelanie Belfi, MD  HYDROcodone-acetaminophen (NORCO/VICODIN) 5-325 MG per tablet Take one-two tabs po q 4-6 hrs prn pain 07/07/14   Tammy L. Triplett, PA-C  HYDROcodone-acetaminophen (NORCO/VICODIN) 5-325 MG per tablet Take 1 tablet by mouth every 6 (six) hours as needed for moderate pain. 07/30/14   Lyanne CoKevin M Campos, MD  HYDROcodone-acetaminophen (NORCO/VICODIN) 5-325 MG per tablet  Take 2 tablets by mouth every 4 (four) hours as needed for moderate pain or severe pain. 09/02/14   Rolan BuccoMelanie Belfi, MD  lansoprazole (PREVACID) 30 MG capsule Take 30 mg by mouth daily at 12 noon.    Historical Provider, MD  Multiple Vitamin (MULTIVITAMIN WITH MINERALS) TABS Take 1 tablet by mouth daily.    Historical Provider, MD  oxyCODONE-acetaminophen (PERCOCET/ROXICET) 5-325 MG per tablet Take 1 tablet by mouth every 4 (four) hours as needed. 07/13/14   Burgess AmorJulie Idol, PA-C   BP 150/94 mmHg  Pulse 86  Temp(Src) 98.2 F (36.8 C) (Oral)  Resp 18  SpO2 99% Physical Exam  Constitutional: She appears well-developed and well-nourished. No distress.  HENT:  Head: Atraumatic.  Tenderness to site of recent dental extraction tooth #12 and #19 with mild gingival erythema and edema but no obvious abscess.  Mild trismus.  Eyes: Conjunctivae are normal.  Neck: Neck supple.  Lymphadenopathy:    She has no cervical adenopathy.  Neurological: She is alert.  Skin: No rash noted.  Psychiatric: She has a normal mood and affect.  Nursing note and vitals reviewed.   ED Course  Procedures (including critical care time)  4:18 PM Patient is here with increased mouth pain status post recent dental extraction. She is concerning of infection. No obvious infection noted. I have revealed her [place] drug database and acknowledged that she has been receiving narcotic pain medication from a dentist at numerous visits as well as narcotic pain medication from the ER visits. Plan to provide pain medication and I will give her 2 additional Vicodin to use for tomorrow and she will follow-up probably with a dentist for further care.   Labs Review Labs Reviewed - No data to display  Imaging Review No results found.   EKG Interpretation None      MDM   Final diagnoses:  Pain, dental    BP 150/94 mmHg  Pulse 86  Temp(Src) 98.2 F (36.8 C) (Oral)  Resp 18  SpO2 99%     Fayrene HelperBowie Kirill Chatterjee, PA-C 11/09/14  1623  Vanetta MuldersScott Zackowski, MD 11/10/14 1908

## 2015-03-03 ENCOUNTER — Emergency Department (HOSPITAL_COMMUNITY): Payer: Self-pay

## 2015-03-03 ENCOUNTER — Emergency Department (HOSPITAL_COMMUNITY)
Admission: EM | Admit: 2015-03-03 | Discharge: 2015-03-03 | Disposition: A | Discharge status: Home / Self Care | Payer: Self-pay | Attending: Emergency Medicine | Admitting: Emergency Medicine

## 2015-03-03 ENCOUNTER — Encounter (HOSPITAL_COMMUNITY): Payer: Self-pay | Admitting: Emergency Medicine

## 2015-03-03 DIAGNOSIS — Z880 Allergy status to penicillin: Secondary | ICD-10-CM | POA: Insufficient documentation

## 2015-03-03 DIAGNOSIS — M5432 Sciatica, left side: Secondary | ICD-10-CM

## 2015-03-03 DIAGNOSIS — G8929 Other chronic pain: Secondary | ICD-10-CM | POA: Insufficient documentation

## 2015-03-03 DIAGNOSIS — Z9889 Other specified postprocedural states: Secondary | ICD-10-CM | POA: Insufficient documentation

## 2015-03-03 DIAGNOSIS — Z8719 Personal history of other diseases of the digestive system: Secondary | ICD-10-CM | POA: Insufficient documentation

## 2015-03-03 DIAGNOSIS — Z720 Tobacco use: Secondary | ICD-10-CM | POA: Insufficient documentation

## 2015-03-03 DIAGNOSIS — I10 Essential (primary) hypertension: Secondary | ICD-10-CM | POA: Insufficient documentation

## 2015-03-03 DIAGNOSIS — Z79899 Other long term (current) drug therapy: Secondary | ICD-10-CM | POA: Insufficient documentation

## 2015-03-03 DIAGNOSIS — Z792 Long term (current) use of antibiotics: Secondary | ICD-10-CM | POA: Insufficient documentation

## 2015-03-03 MED ORDER — PREDNISONE 10 MG PO TABS: ORAL_TABLET | ORAL | Status: DC

## 2015-03-03 MED ORDER — PREDNISONE 50 MG PO TABS
60.0000 mg | ORAL_TABLET | Freq: Once | ORAL | Status: AC
  Administered 2015-03-03: 60 mg via ORAL
  Filled 2015-03-03 (×2): qty 1

## 2015-03-03 MED ORDER — HYDROCODONE-ACETAMINOPHEN 5-325 MG PO TABS: 1.0000 | ORAL_TABLET | ORAL | Status: DC | PRN

## 2015-03-03 MED ORDER — HYDROCODONE-ACETAMINOPHEN 5-325 MG PO TABS
1.0000 | ORAL_TABLET | Freq: Once | ORAL | Status: AC
  Administered 2015-03-03: 1 via ORAL
  Filled 2015-03-03: qty 1

## 2015-03-03 NOTE — ED Provider Notes (Signed)
CSN: [id number]     Arrival date & time 03/03/15  1507 History   First MD Initiated Contact with Patient 03/03/15 1659     Chief Complaint  Patient presents with  . Back Pain     (Consider location/radiation/quality/duration/timing/severity/associated sxs/prior Treatment) The history is provided by the patient.   Heather [name] is a 54 y.o. female with a past history of chronic low back pain and lumbar fusion surgery by Dr. Dutch QuintPoole 6 years ago presenting with worsened back pain with sensation of occasional movement in her lower spine over the past week along with radicular pain down the left posterior leg to the ankle at its worst, but varies in distribution.  She denies any new injury, no falls.  Is concerned about hardware failure.  Denies weakness in the legs, has occasional left leg numbness.  No urinary or bowel incontinence or retention.  She has taken tylenol which offers transient relief.    Past Medical History  Diagnosis Date  . Chronic back pain   . Hypertension   . Barrett's esophageal ulceration   . GI bleed   . Pancreatic lesion    Past Surgical History  Procedure Laterality Date  . Back surgery    . Abdominal surgery      at age 54 for malrotation; appendectomy  . Cesarean section    . Ventral hernia repair  2003  . Abdominal hysterectomy  2003    with Left Salpingo  . Laparoscopic incisional / umbilical / ventral hernia repair  2008  . Laparoscopic nissen fundoplication  2000  . Esophagogastroduodenoscopy  02/26/2012    Procedure: ESOPHAGOGASTRODUODENOSCOPY (EGD);  Surgeon: Petra KubaMarc E Magod, MD;  [facility];  Service: Endoscopy;  Laterality: N/A;   History reviewed. No pertinent family history. History  Substance Use Topics  . Smoking status: Current Every Day Smoker -- 0.50 packs/day    Types: Cigarettes  . Smokeless tobacco: Not on file  . Alcohol Use: No   OB History    No data available     Review of Systems  Constitutional: Negative for  fever.  Respiratory: Negative for shortness of breath.   Cardiovascular: Negative for chest pain and leg swelling.  Gastrointestinal: Negative for abdominal pain, constipation and abdominal distention.  Genitourinary: Negative for dysuria, urgency, frequency, flank pain and difficulty urinating.  Musculoskeletal: Positive for back pain. Negative for joint swelling and gait problem.  Skin: Negative for rash.  Neurological: Negative for weakness and numbness.      Allergies  Penicillins  Home Medications   Prior to Admission medications   Medication Sig Start Date End Date Taking? Authorizing Provider  acetaminophen (TYLENOL) 500 MG tablet Take 500-1,000 mg by mouth every 6 (six) hours as needed for mild pain or moderate pain.   Yes Historical Provider, MD  acyclovir (ZOVIRAX) 200 MG capsule Take 2 capsules (400 mg total) by mouth 3 (three) times daily. Patient not taking: Reported on 03/03/2015 07/13/14   Burgess AmorJulie Rhys Anchondo, PA-C  clindamycin (CLEOCIN) 300 MG capsule Take 1 capsule (300 mg total) by mouth 4 (four) times daily. Patient not taking: Reported on 03/03/2015 07/07/14   Tammi Triplett, PA-C  clindamycin (CLEOCIN) 300 MG capsule Take 1 capsule (300 mg total) by mouth every 6 (six) hours. 07/30/14   Azalia BilisKevin Campos, MD  clindamycin (CLEOCIN) 300 MG capsule Take 1 capsule (300 mg total) by mouth 4 (four) times daily. X 7 days Patient not taking: Reported on 03/03/2015 09/02/14   Rolan BuccoMelanie Belfi, MD  HYDROcodone-acetaminophen (NORCO/VICODIN) 5-325 MG per tablet Take 1 tablet by mouth every 4 (four) hours as needed. 03/03/15   Burgess Amor, PA-C  predniSONE (DELTASONE) 10 MG tablet 6, 5, 4, 3, 2 then 1 tablet by mouth daily for 6 days total. 03/03/15   Burgess Amor, PA-C   BP 154/84 mmHg  Pulse 97  Temp(Src) 98.9 F (37.2 C) (Oral)  Resp 18  Ht  (1.651 m)  Wt 158 lb (71.668 kg)  BMI 26.29 kg/m2  SpO2 98% Physical Exam  Constitutional: She appears well-developed and well-nourished.  HENT:   Head: Normocephalic.  Eyes: Conjunctivae are normal.  Neck: Normal range of motion. Neck supple.  Cardiovascular: Normal rate and intact distal pulses.   Pedal pulses normal.  Pulmonary/Chest: Effort normal.  Abdominal: Soft. Bowel sounds are normal. She exhibits no distension and no mass.  Musculoskeletal: Normal range of motion. She exhibits no edema.       Lumbar back: She exhibits tenderness. She exhibits no swelling, no edema, no deformity and no spasm.  Well healed midline incision.  Neurological: She is alert. She has normal strength. She displays no atrophy and no tremor. No sensory deficit. Gait normal.  Reflex Scores:      Patellar reflexes are 2+ on the right side and 2+ on the left side.      Achilles reflexes are 2+ on the right side and 2+ on the left side. No strength deficit noted in hip and knee flexor and extensor muscle groups.  Ankle flexion and extension intact.  Skin: Skin is warm and dry.  Psychiatric: She has a normal mood and affect.  Nursing note and vitals reviewed.   ED Course  Procedures (including critical care time) Labs Review Labs Reviewed - No data to display  Imaging Review Dg Lumbar Spine Complete  03/03/2015   CLINICAL DATA:  Pt c/o lower back pain x 7 days without injury. Pt states pain decreases when in a supine position. Pain increases while seated and ambulating. Hx chronic lower back pain, lumbar fusion, hernia repair, hysterectomy, appendectomy  EXAM: LUMBAR SPINE - COMPLETE 4+ VIEW  COMPARISON:  08/13/2013  FINDINGS: Subtle curvature of the lumbar spine convex right unchanged. Therefore non-rib-bearing lumbar vertebrae. Hypoplastic twelfth ribs are present. Posterior fusion hardware at the L4-S1 level is intact and unchanged. Intervertebral disc spacer at the L5-S1 level unchanged. There is mild spondylosis of the lumbar spine. There is no compression fracture or subluxation. Evidence of a previous hernia repair over the midline of the mid  abdomen. Surgical clip over the pelvis unchanged. Remainder of the exam is unchanged.  IMPRESSION: Mild spondylosis of the lumbar spine with posterior fusion hardware intact at the L4-S1 level.   Electronically Signed   By: Elberta Fortis M.D.   On: 03/03/2015 18:16     EKG Interpretation None      MDM   Final diagnoses:  Sciatica neuralgia, left    Patients labs and/or radiological studies were reviewed and considered during the medical decision making and disposition process.  Results were also discussed with patient.  No neuro deficit on exam or by history to suggest emergent or surgical presentation.  Also discussed worsened sx that should prompt immediate re-evaluation including distal weakness, bowel/bladder retention/incontinence.  Prednisone, hydrocodone. Plan f/u with pcp prn.  Also discussed recheck by Dr. Dutch Quint prn.        Burgess Amor, PA-C 03/03/15 Paulo Fruit  Bethann Berkshire, MD 03/03/15 2021

## 2015-03-03 NOTE — ED Notes (Signed)
Having pain lower back for last 7 days, rates pain 8.  Took tylenol with mild relief.

## 2015-03-03 NOTE — ED Notes (Signed)
nad noted prior to dc. Dc instructions reviewed and explained. Voiced understanding. 2 Rx given to pt. Ambulated out without difficulty.

## 2015-03-03 NOTE — Discharge Instructions (Signed)
Sciatica °Sciatica is pain, weakness, numbness, or tingling along the path of the sciatic nerve. The nerve starts in the lower back and runs down the back of each leg. The nerve controls the muscles in the lower leg and in the back of the knee, while also providing sensation to the back of the thigh, lower leg, and the sole of your foot. Sciatica is a symptom of another medical condition. For instance, nerve damage or certain conditions, such as a herniated disk or bone spur on the spine, pinch or put pressure on the sciatic nerve. This causes the pain, weakness, or other sensations normally associated with sciatica. Generally, sciatica only affects one side of the body. °CAUSES  °· Herniated or slipped disc. °· Degenerative disk disease. °· A pain disorder involving the narrow muscle in the buttocks (piriformis syndrome). °· Pelvic injury or fracture. °· Pregnancy. °· Tumor (rare). °SYMPTOMS  °Symptoms can vary from mild to very severe. The symptoms usually travel from the low back to the buttocks and down the back of the leg. Symptoms can include: °· Mild tingling or dull aches in the lower back, leg, or hip. °· Numbness in the back of the calf or sole of the foot. °· Burning sensations in the lower back, leg, or hip. °· Sharp pains in the lower back, leg, or hip. °· Leg weakness. °· Severe back pain inhibiting movement. °These symptoms may get worse with coughing, sneezing, laughing, or prolonged sitting or standing. Also, being overweight may worsen symptoms. °DIAGNOSIS  °Your caregiver will perform a physical exam to look for common symptoms of sciatica. He or she may ask you to do certain movements or activities that would trigger sciatic nerve pain. Other tests may be performed to find the cause of the sciatica. These may include: °· Blood tests. °· X-rays. °· Imaging tests, such as an MRI or CT scan. °TREATMENT  °Treatment is directed at the cause of the sciatic pain. Sometimes, treatment is not necessary  and the pain and discomfort goes away on its own. If treatment is needed, your caregiver may suggest: °· Over-the-counter medicines to relieve pain. °· Prescription medicines, such as anti-inflammatory medicine, muscle relaxants, or narcotics. °· Applying heat or ice to the painful area. °· Steroid injections to lessen pain, irritation, and inflammation around the nerve. °· Reducing activity during periods of pain. °· Exercising and stretching to strengthen your abdomen and improve flexibility of your spine. Your caregiver may suggest losing weight if the extra weight makes the back pain worse. °· Physical therapy. °· Surgery to eliminate what is pressing or pinching the nerve, such as a bone spur or part of a herniated disk. °HOME CARE INSTRUCTIONS  °· Only take over-the-counter or prescription medicines for pain or discomfort as directed by your caregiver. °· Apply ice to the affected area for 20 minutes, 3-4 times a day for the first 48-72 hours. Then try heat in the same way. °· Exercise, stretch, or perform your usual activities if these do not aggravate your pain. °· Attend physical therapy sessions as directed by your caregiver. °· Keep all follow-up appointments as directed by your caregiver. °· Do not wear high heels or shoes that do not provide proper support. °· Check your mattress to see if it is too soft. A firm mattress may lessen your pain and discomfort. °SEEK IMMEDIATE MEDICAL CARE IF:  °· You lose control of your bowel or bladder (incontinence). °· You have increasing weakness in the lower back, pelvis, buttocks,   or legs.  You have redness or swelling of your back.  You have a burning sensation when you urinate.  You have pain that gets worse when you lie down or awakens you at night.  Your pain is worse than you have experienced in the past.  Your pain is lasting longer than 4 weeks.  You are suddenly losing weight without reason. MAKE SURE YOU:  Understand these  instructions.  Will watch your condition.  Will get help right away if you are not doing well or get worse. Document Released: 11/29/2001 Document Revised: 06/05/2012 Document Reviewed: 04/15/2012 [facility] Patient Information 2015 [place], [place]. This information is not intended to replace advice given to you by your health care provider. Make sure you discuss any questions you have with your health care provider.   Take your next dose of prednisone tomorrow evening.  Use the the other medicines as directed.  Do not drive within 4 hours of taking oxycodone as this will make you drowsy.  Avoid lifting,  Bending,  Twisting or any other activity that worsens your pain over the next week.  Apply an  icepack  to your lower back for 10-15 minutes every 2 hours for the next 2 days.  You should get rechecked if your symptoms are not better over the next 5 days,  Or you develop increased pain,  Weakness in your leg(s) or loss of bladder or bowel function - these are symptoms of a worse injury.

## 2015-03-29 ENCOUNTER — Encounter (HOSPITAL_BASED_OUTPATIENT_CLINIC_OR_DEPARTMENT_OTHER): Payer: Self-pay | Admitting: *Deleted

## 2015-03-29 ENCOUNTER — Emergency Department (HOSPITAL_BASED_OUTPATIENT_CLINIC_OR_DEPARTMENT_OTHER): Payer: Self-pay

## 2015-03-29 ENCOUNTER — Emergency Department (HOSPITAL_BASED_OUTPATIENT_CLINIC_OR_DEPARTMENT_OTHER)
Admission: EM | Admit: 2015-03-29 | Discharge: 2015-03-29 | Disposition: A | Discharge status: Home / Self Care | Payer: Self-pay | Attending: Emergency Medicine | Admitting: Emergency Medicine

## 2015-03-29 DIAGNOSIS — X58XXXA Exposure to other specified factors, initial encounter: Secondary | ICD-10-CM | POA: Insufficient documentation

## 2015-03-29 DIAGNOSIS — Y9389 Activity, other specified: Secondary | ICD-10-CM | POA: Insufficient documentation

## 2015-03-29 DIAGNOSIS — S29019A Strain of muscle and tendon of unspecified wall of thorax, initial encounter: Secondary | ICD-10-CM | POA: Insufficient documentation

## 2015-03-29 DIAGNOSIS — Y998 Other external cause status: Secondary | ICD-10-CM | POA: Insufficient documentation

## 2015-03-29 DIAGNOSIS — Z792 Long term (current) use of antibiotics: Secondary | ICD-10-CM | POA: Insufficient documentation

## 2015-03-29 DIAGNOSIS — R0602 Shortness of breath: Secondary | ICD-10-CM | POA: Insufficient documentation

## 2015-03-29 DIAGNOSIS — Z720 Tobacco use: Secondary | ICD-10-CM | POA: Insufficient documentation

## 2015-03-29 DIAGNOSIS — Z880 Allergy status to penicillin: Secondary | ICD-10-CM | POA: Insufficient documentation

## 2015-03-29 DIAGNOSIS — I10 Essential (primary) hypertension: Secondary | ICD-10-CM | POA: Insufficient documentation

## 2015-03-29 DIAGNOSIS — G8929 Other chronic pain: Secondary | ICD-10-CM | POA: Insufficient documentation

## 2015-03-29 DIAGNOSIS — S29011A Strain of muscle and tendon of front wall of thorax, initial encounter: Secondary | ICD-10-CM

## 2015-03-29 DIAGNOSIS — Z7952 Long term (current) use of systemic steroids: Secondary | ICD-10-CM | POA: Insufficient documentation

## 2015-03-29 DIAGNOSIS — Z79899 Other long term (current) drug therapy: Secondary | ICD-10-CM | POA: Insufficient documentation

## 2015-03-29 DIAGNOSIS — Y9289 Other specified places as the place of occurrence of the external cause: Secondary | ICD-10-CM | POA: Insufficient documentation

## 2015-03-29 MED ORDER — HYDROCODONE-ACETAMINOPHEN 5-325 MG PO TABS
2.0000 | ORAL_TABLET | Freq: Once | ORAL | Status: AC
  Administered 2015-03-29: 2 via ORAL
  Filled 2015-03-29: qty 2

## 2015-03-29 MED ORDER — CYCLOBENZAPRINE HCL 5 MG PO TABS: 5.0000 mg | ORAL_TABLET | Freq: Three times a day (TID) | ORAL | Status: DC | PRN

## 2015-03-29 MED ORDER — CYCLOBENZAPRINE HCL 10 MG PO TABS
5.0000 mg | ORAL_TABLET | Freq: Once | ORAL | Status: AC
Start: 2015-03-29 — End: 2015-03-29
  Administered 2015-03-29: 5 mg via ORAL
  Filled 2015-03-29: qty 1

## 2015-03-29 MED ORDER — HYDROCODONE-ACETAMINOPHEN 5-325 MG PO TABS: 1.0000 | ORAL_TABLET | Freq: Four times a day (QID) | ORAL | Status: DC | PRN

## 2015-03-29 NOTE — ED Notes (Signed)
Pt reports she moved a rocking chair yesterday and felt a "pop" in her left chest- since then has had chest pain and SOB- pain radiates into left shoulder and left arm

## 2015-03-29 NOTE — Discharge Instructions (Signed)
Take flexeril for muscle spasms.   Take vicodin for severe pain. Do NOT drive with it.   Follow up with your doctor.   Return to ER if you have severe pain, worse trouble breathing, vomiting.

## 2015-03-29 NOTE — ED Provider Notes (Signed)
CSN: [id number]     Arrival date & time 03/29/15  1633 History  This chart was scribed for Richardean Canal, MD by Tonye Royalty, ED Scribe. This patient was seen in room MH01/MH01 and the patient's care was started at 6:38 PM.    Chief Complaint  Patient presents with  . Chest Pain   The history is provided by the patient. No language interpreter was used.    HPI Comments: Heather [name] is a 54 y.o. female who presents to the Emergency Department complaining of chest pain with onset yesterday after moving a rocking chair and feeling a "pop." She states she moves this chest x-ray every day to vacuum. She states pain radiates to left shoulder and left arm. She states she also feels short of breath with activity. She states she has used Tylenol only for pain.  Past Medical History  Diagnosis Date  . Chronic back pain   . Hypertension   . Barrett's esophageal ulceration   . GI bleed   . Pancreatic lesion    Past Surgical History  Procedure Laterality Date  . Back surgery    . 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 Cesarean section    . Ventral hernia repair  2003  . Abdominal hysterectomy  2003    with Left Salpingo  . Laparoscopic incisional / umbilical / ventral hernia repair  2008  . Laparoscopic nissen fundoplication  2000  . Esophagogastroduodenoscopy  02/26/2012    Procedure: ESOPHAGOGASTRODUODENOSCOPY (EGD);  Surgeon: Petra Kuba, MD;  Location: [facility];  Service: Endoscopy;  Laterality: N/A;   No family history on file. History  Substance Use Topics  . Smoking status: Current Every Day Smoker -- 0.50 packs/day    Types: Cigarettes  . Smokeless tobacco: Not on file  . Alcohol Use: No   OB History    No data available     Review of Systems  Respiratory: Positive for shortness of breath.   Cardiovascular: Positive for chest pain.  All other systems reviewed and are negative.     Allergies  Penicillins  Home Medications   Prior  to Admission medications   Medication Sig Start Date End Date Taking? Authorizing Provider  acetaminophen (TYLENOL) 500 MG tablet Take 500-1,000 mg by mouth every 6 (six) hours as needed for mild pain or moderate pain.    Historical Provider, MD  acyclovir (ZOVIRAX) 200 MG capsule Take 2 capsules (400 mg total) by mouth 3 (three) times daily. Patient not taking: Reported on 03/03/2015 07/13/14   Burgess Amor, PA-C  clindamycin (CLEOCIN) 300 MG capsule Take 1 capsule (300 mg total) by mouth 4 (four) times daily. Patient not taking: Reported on 03/03/2015 07/07/14   Tammi Triplett, PA-C  clindamycin (CLEOCIN) 300 MG capsule Take 1 capsule (300 mg total) by mouth every 6 (six) hours. 07/30/14   Azalia Bilis, MD  clindamycin (CLEOCIN) 300 MG capsule Take 1 capsule (300 mg total) by mouth 4 (four) times daily. X 7 days Patient not taking: Reported on 03/03/2015 09/02/14   Rolan Bucco, MD  HYDROcodone-acetaminophen (NORCO/VICODIN) 5-325 MG per tablet Take 1 tablet by mouth every 4 (four) hours as needed. 03/03/15   Burgess Amor, PA-C  predniSONE (DELTASONE) 10 MG tablet 6, 5, 4, 3, 2 then 1 tablet by mouth daily for 6 days total. 03/03/15   Burgess Amor, PA-C   BP 143/83 mmHg  Pulse 73  Temp(Src) 99.6 F (37.6 C) (Oral)  Resp 16  Ht 5\' 4"  (1.626 m)  Wt 158 lb (71.668 kg)  BMI 27.11 kg/m2  SpO2 97% Physical Exam  Constitutional: She is oriented to person, place, and time. She appears well-developed and well-nourished.  HENT:  Head: Normocephalic and atraumatic.  Eyes: Conjunctivae are normal.  Neck: Normal range of motion. Neck supple.  Cardiovascular: Normal rate, regular rhythm and intact distal pulses.   Pulmonary/Chest: Effort normal and breath sounds normal. No respiratory distress. She has no wheezes. She has no rales.  Reproducible sternal chest pain Good air movement. No crackles or wheezing   Abdominal: Soft. There is no tenderness.  Musculoskeletal: Normal range of motion.  Neurological: She  is alert and oriented to person, place, and time.  Skin: Skin is warm and dry.  Psychiatric: She has a normal mood and affect.  Nursing note and vitals reviewed.   ED Course  Procedures (including critical care time)  DIAGNOSTIC STUDIES: Oxygen Saturation is 97% on room air, normal by my interpretation.    COORDINATION OF CARE: 6:42 PM Reviewed chest x-ray with patient which revealed no acute findings. Discussed treatment plan with patient at beside, including muscle relaxant and pain medication. The patient agrees with the plan and has no further questions at this time.   Labs Review Labs Reviewed - No data to display  Imaging Review Dg Chest 2 View  03/29/2015   CLINICAL DATA:  Chest pain, shortness of breath, started after moving a rocking chair yesterday  EXAM: CHEST  2 VIEW  COMPARISON:  Report 10/21/2013, no images available.  FINDINGS: The heart size and mediastinal contours are within normal limits. Both lungs are clear. The visualized skeletal structures are unremarkable.  IMPRESSION: No active cardiopulmonary disease.   Electronically Signed   By: Natasha MeadLiviu  Pop M.D.   On: 03/29/2015 17:38     EKG Interpretation   Date/Time:  Sunday March 29 2015 16:51:45 EDT Ventricular Rate:  84 PR Interval:  124 QRS Duration: 82 QT Interval:  384 QTC Calculation: 453 R Axis:   46 Text Interpretation:  Normal sinus rhythm Nonspecific ST abnormality  Abnormal ECG No significant change since last tracing Confirmed by Breckyn Troyer   MD, Bently Wyss ([id number]) on 03/29/2015 6:38:03 PM      MDM   Final diagnoses:  None   Heather [name] is a 54 y.o. female here with muscle strain after moving furniture. Pain reproducible. CXR showed no fracture. EKG unremarkable. I doubt PE or ACS. Will dc home with vicodin, flexeril.   I personally performed the services described in this documentation, which was scribed in my presence. The recorded information has been reviewed and is accurate.     Richardean Canalavid H Ilianna Bown,  MD 03/29/15 [id number]

## 2015-04-06 ENCOUNTER — Encounter (HOSPITAL_COMMUNITY): Payer: Self-pay | Admitting: Emergency Medicine

## 2015-04-06 ENCOUNTER — Emergency Department (HOSPITAL_COMMUNITY)
Admission: EM | Admit: 2015-04-06 | Discharge: 2015-04-06 | Disposition: A | Discharge status: Home / Self Care | Payer: Self-pay | Attending: Emergency Medicine | Admitting: Emergency Medicine

## 2015-04-06 ENCOUNTER — Emergency Department (HOSPITAL_COMMUNITY): Payer: Self-pay

## 2015-04-06 DIAGNOSIS — G8929 Other chronic pain: Secondary | ICD-10-CM | POA: Insufficient documentation

## 2015-04-06 DIAGNOSIS — Z8719 Personal history of other diseases of the digestive system: Secondary | ICD-10-CM | POA: Insufficient documentation

## 2015-04-06 DIAGNOSIS — Z720 Tobacco use: Secondary | ICD-10-CM | POA: Insufficient documentation

## 2015-04-06 DIAGNOSIS — Z880 Allergy status to penicillin: Secondary | ICD-10-CM | POA: Insufficient documentation

## 2015-04-06 DIAGNOSIS — Z7952 Long term (current) use of systemic steroids: Secondary | ICD-10-CM | POA: Insufficient documentation

## 2015-04-06 DIAGNOSIS — I10 Essential (primary) hypertension: Secondary | ICD-10-CM | POA: Insufficient documentation

## 2015-04-06 DIAGNOSIS — R0789 Other chest pain: Secondary | ICD-10-CM | POA: Insufficient documentation

## 2015-04-06 DIAGNOSIS — Z792 Long term (current) use of antibiotics: Secondary | ICD-10-CM | POA: Insufficient documentation

## 2015-04-06 MED ORDER — OXYCODONE-ACETAMINOPHEN 5-325 MG PO TABS: 1.0000 | ORAL_TABLET | Freq: Four times a day (QID) | ORAL | Status: DC | PRN

## 2015-04-06 MED ORDER — OXYCODONE-ACETAMINOPHEN 5-325 MG PO TABS
1.0000 | ORAL_TABLET | Freq: Once | ORAL | Status: AC
  Administered 2015-04-06: 1 via ORAL
  Filled 2015-04-06: qty 1

## 2015-04-06 NOTE — ED Notes (Signed)
Pt reports she picked up a chair and felt something pop in her chest. Pt states she has had increasing pain and reports some SOB.

## 2015-04-06 NOTE — ED Notes (Signed)
Pt alert & oriented x4, stable gait. Patient given discharge instructions, paperwork & prescription(s). Patient informed not to drive, operate any equipment & handel any important documents 4 hours after taking pain medication. Patient  instructed to stop at the registration desk to finish any additional paperwork. Patient  verbalized understanding. Pt left department w/ no further questions. 

## 2015-04-06 NOTE — Discharge Instructions (Signed)
Follow up with your md next week for recheck °

## 2015-04-06 NOTE — ED Notes (Addendum)
Patient states she has been having shortness of breath when moving or when she "talks a lot".  Lungs are clear with symmetrical expansion.  Patient states this occurred after "pulling an old granny rocker" she states she felt a "popping feeling".  Patient went to an urgent care the day after it occurred and they explained she pulled something.

## 2015-04-06 NOTE — ED Provider Notes (Signed)
CSN: [id number]     Arrival date & time 04/06/15  1656 History   First MD Initiated Contact with Patient 04/06/15 1829     Chief Complaint  Patient presents with  . Chest Injury     (Consider location/radiation/quality/duration/timing/severity/associated sxs/prior Treatment) Patient is a 54 y.o. female presenting with chest pain. The history is provided by the patient (the pt complains of chest pain.  the pt states she hurt it when she was lifting somithing).  Chest Pain Pain location:  L chest Pain quality: aching   Pain radiates to:  Does not radiate Pain radiates to the back: no   Pain severity:  Moderate Onset quality:  Sudden Timing:  Constant Progression:  Waxing and waning Chronicity:  New Context: no drug use   Associated symptoms: no abdominal pain, no back pain, no cough, no fatigue and no headache     Past Medical History  Diagnosis Date  . Chronic back pain   . Hypertension   . Barrett's esophageal ulceration   . GI bleed   . Pancreatic lesion    Past Surgical History  Procedure Laterality Date  . Back surgery    . Abdominal surgery      at age 57 for malrotation; appendectomy  . Cesarean section    . Ventral hernia repair  2003  . Abdominal hysterectomy  2003    with Left Salpingo  . Laparoscopic incisional / umbilical / ventral hernia repair  2008  . Laparoscopic nissen fundoplication  2000  . Esophagogastroduodenoscopy  02/26/2012    Procedure: ESOPHAGOGASTRODUODENOSCOPY (EGD);  Surgeon: Petra Kuba, MD;  Location: [facility];  Service: Endoscopy;  Laterality: N/A;   Family History  Problem Relation Age of Onset  . Heart failure Mother   . Diabetes Mother   . Asthma Mother   . Heart failure Father   . Heart attack Father   . Heart failure Sister   . Diabetes Sister   . Kidney failure Sister   . Heart failure Brother    History  Substance Use Topics  . Smoking status: Current Every Day Smoker -- 0.50 packs/day    Types: Cigarettes  .  Smokeless tobacco: Not on file  . Alcohol Use: No   OB History    Gravida Para Term Preterm AB TAB SAB Ectopic Multiple Living   Review of Systems  Constitutional: Negative for appetite change and fatigue.  HENT: Negative for congestion, ear discharge and sinus pressure.   Eyes: Negative for discharge.  Respiratory: Negative for cough.   Cardiovascular: Positive for chest pain.  Gastrointestinal: Negative for abdominal pain and diarrhea.  Genitourinary: Negative for frequency and hematuria.  Musculoskeletal: Negative for back pain.  Skin: Negative for rash.  Neurological: Negative for seizures and headaches.  Psychiatric/Behavioral: Negative for hallucinations.      Allergies  Penicillins  Home Medications   Prior to Admission medications   Medication Sig Start Date End Date Taking? Authorizing Provider  acetaminophen (TYLENOL) 500 MG tablet Take 500-1,000 mg by mouth every 6 (six) hours as needed for mild pain or moderate pain.   Yes Historical Provider, MD  clindamycin (CLEOCIN) 300 MG capsule Take 1 capsule (300 mg total) by mouth 4 (four) times daily. Patient not taking: Reported on 03/03/2015 07/07/14   Tammy Triplett, PA-C  clindamycin (CLEOCIN) 300 MG capsule Take 1 capsule (300 mg total) by mouth every 6 (six) hours. Patient not  taking: Reported on 04/06/2015 07/30/14   Azalia BilisKevin Campos, MD  clindamycin (CLEOCIN) 300 MG capsule Take 1 capsule (300 mg total) by mouth 4 (four) times daily. X 7 days Patient not taking: Reported on 03/03/2015 09/02/14   Rolan BuccoMelanie Belfi, MD  cyclobenzaprine (FLEXERIL) 5 MG tablet Take 1 tablet (5 mg total) by mouth 3 (three) times daily as needed for muscle spasms. Patient not taking: Reported on 04/06/2015 03/29/15   Richardean Canalavid H Yao, MD  HYDROcodone-acetaminophen (NORCO/VICODIN) 5-325 MG per tablet Take 1 tablet by mouth every 6 (six) hours as needed. Patient not taking: Reported on 04/06/2015 03/29/15   Richardean Canalavid H Yao, MD   oxyCODONE-acetaminophen (PERCOCET/ROXICET) 5-325 MG per tablet Take 1 tablet by mouth every 6 (six) hours as needed. 04/06/15   Bethann BerkshireJoseph Jamarrion Budai, MD  predniSONE (DELTASONE) 10 MG tablet 6, 5, 4, 3, 2 then 1 tablet by mouth daily for 6 days total. Patient not taking: Reported on 04/06/2015 03/03/15   Burgess AmorJulie Idol, PA-C   BP 151/78 mmHg  Pulse 72  Temp(Src) 99.4 F (37.4 C) (Oral)  Resp 16  Ht 5\' 4"  (1.626 m)  Wt 158 lb (71.668 kg)  BMI 27.11 kg/m2  SpO2 100% Physical Exam  Constitutional: She is oriented to person, place, and time. She appears well-developed.  HENT:  Head: Normocephalic.  Eyes: Conjunctivae and EOM are normal. No scleral icterus.  Neck: Neck supple. No thyromegaly present.  Cardiovascular: Normal rate and regular rhythm.  Exam reveals no gallop and no friction rub.   No murmur heard. Pulmonary/Chest: No stridor. She has no wheezes. She has no rales. She exhibits tenderness.  Abdominal: She exhibits no distension. There is no tenderness. There is no rebound.  Musculoskeletal: Normal range of motion. She exhibits no edema.  Lymphadenopathy:    She has no cervical adenopathy.  Neurological: She is oriented to person, place, and time. She exhibits normal muscle tone. Coordination normal.  Skin: No rash noted. No erythema.  Psychiatric: She has a normal mood and affect. Her behavior is normal.    ED Course  Procedures (including critical care time) Labs Review Labs Reviewed - No data to display  Imaging Review Dg Ribs Unilateral W/chest Left  04/06/2015   CLINICAL DATA:  Left-sided upper rib pain after moving rocking chair 9 days ago. Initial encounter.  EXAM: LEFT RIBS AND CHEST - 3+ VIEW  COMPARISON:  March 29, 2015.  FINDINGS: No fracture or other bone lesions are seen involving the ribs. There is no evidence of pneumothorax or pleural effusion. Right lung is clear. Interval development of mild lingular opacity is noted consistent with atelectasis or pneumonia. Heart  size and mediastinal contours are within normal limits.  IMPRESSION: Normal left ribs. Interval development of mild lingular opacity consistent with atelectasis or pneumonia.   Electronically Signed   By: Lupita RaiderJames  Green Jr, M.D.   On: 04/06/2015 19:38     EKG Interpretation None      MDM   Final diagnoses:  Chest wall pain    Chest wall pain.  tx with percocet and follow up     Bethann BerkshireJoseph Erland Vivas, MD 04/06/15 2043

## 2015-06-08 ENCOUNTER — Encounter (HOSPITAL_BASED_OUTPATIENT_CLINIC_OR_DEPARTMENT_OTHER): Payer: Self-pay | Admitting: *Deleted

## 2015-06-08 ENCOUNTER — Emergency Department (HOSPITAL_BASED_OUTPATIENT_CLINIC_OR_DEPARTMENT_OTHER)
Admission: EM | Admit: 2015-06-08 | Discharge: 2015-06-08 | Disposition: A | Discharge status: Home / Self Care | Payer: Self-pay | Attending: Emergency Medicine | Admitting: Emergency Medicine

## 2015-06-08 DIAGNOSIS — M545 Low back pain: Secondary | ICD-10-CM | POA: Insufficient documentation

## 2015-06-08 DIAGNOSIS — G8929 Other chronic pain: Secondary | ICD-10-CM | POA: Insufficient documentation

## 2015-06-08 DIAGNOSIS — M549 Dorsalgia, unspecified: Secondary | ICD-10-CM

## 2015-06-08 DIAGNOSIS — Z720 Tobacco use: Secondary | ICD-10-CM | POA: Insufficient documentation

## 2015-06-08 DIAGNOSIS — M25552 Pain in left hip: Secondary | ICD-10-CM | POA: Insufficient documentation

## 2015-06-08 DIAGNOSIS — I10 Essential (primary) hypertension: Secondary | ICD-10-CM | POA: Insufficient documentation

## 2015-06-08 DIAGNOSIS — Z9889 Other specified postprocedural states: Secondary | ICD-10-CM | POA: Insufficient documentation

## 2015-06-08 DIAGNOSIS — M25562 Pain in left knee: Secondary | ICD-10-CM | POA: Insufficient documentation

## 2015-06-08 DIAGNOSIS — Z880 Allergy status to penicillin: Secondary | ICD-10-CM | POA: Insufficient documentation

## 2015-06-08 DIAGNOSIS — Z8719 Personal history of other diseases of the digestive system: Secondary | ICD-10-CM | POA: Insufficient documentation

## 2015-06-08 LAB — URINALYSIS, ROUTINE W REFLEX MICROSCOPIC
BILIRUBIN URINE: NEGATIVE
Glucose, UA: NEGATIVE mg/dL
HGB URINE DIPSTICK: NEGATIVE
Ketones, ur: NEGATIVE mg/dL
Leukocytes, UA: NEGATIVE
Nitrite: NEGATIVE
PROTEIN: NEGATIVE mg/dL
SPECIFIC GRAVITY, URINE: 1.003 — AB (ref 1.005–1.030)
Urobilinogen, UA: 0.2 mg/dL (ref 0.0–1.0)
pH: 6 (ref 5.0–8.0)

## 2015-06-08 MED ORDER — TRAMADOL HCL 50 MG PO TABS: 50.0000 mg | ORAL_TABLET | Freq: Four times a day (QID) | ORAL | Status: DC | PRN

## 2015-06-08 NOTE — ED Notes (Signed)
Back pain. Hx of chronic pain. She also pain radiating down her left leg. States it feels different from her normal leg pain.

## 2015-06-08 NOTE — Discharge Instructions (Signed)
Arthralgia °Your caregiver has diagnosed you as suffering from an arthralgia. Arthralgia means there is pain in a joint. This can come from many reasons including: °· Bruising the joint which causes soreness (inflammation) in the joint. °· Wear and tear on the joints which occur as we grow older (osteoarthritis). °· Overusing the joint. °· Various forms of arthritis. °· Infections of the joint. °Regardless of the cause of pain in your joint, most of these different pains respond to anti-inflammatory drugs and rest. The exception to this is when a joint is infected, and these cases are treated with antibiotics, if it is a bacterial infection. °HOME CARE INSTRUCTIONS  °· Rest the injured area for as long as directed by your caregiver. Then slowly start using the joint as directed by your caregiver and as the pain allows. Crutches as directed may be useful if the ankles, knees or hips are involved. If the knee was splinted or casted, continue use and care as directed. If an stretchy or elastic wrapping bandage has been applied today, it should be removed and re-applied every 3 to 4 hours. It should not be applied tightly, but firmly enough to keep swelling down. Watch toes and feet for swelling, bluish discoloration, coldness, numbness or excessive pain. If any of these problems (symptoms) occur, remove the ace bandage and re-apply more loosely. If these symptoms persist, contact your caregiver or return to this location. °· For the first 24 hours, keep the injured extremity elevated on pillows while lying down. °· Apply ice for 15-20 minutes to the sore joint every couple hours while awake for the first half day. Then 03-04 times per day for the first 48 hours. Put the ice in a plastic bag and place a towel between the bag of ice and your skin. °· Wear any splinting, casting, elastic bandage applications, or slings as instructed. °· Only take over-the-counter or prescription medicines for pain, discomfort, or fever as  directed by your caregiver. Do not use aspirin immediately after the injury unless instructed by your physician. Aspirin can cause increased bleeding and bruising of the tissues. °· If you were given crutches, continue to use them as instructed and do not resume weight bearing on the sore joint until instructed. °Persistent pain and inability to use the sore joint as directed for more than 2 to 3 days are warning signs indicating that you should see a caregiver for a follow-up visit as soon as possible. Initially, a hairline fracture (break in bone) may not be evident on X-rays. Persistent pain and swelling indicate that further evaluation, non-weight bearing or use of the joint (use of crutches or slings as instructed), or further X-rays are indicated. X-rays may sometimes not show a small fracture until a week or 10 days later. Make a follow-up appointment with your own caregiver or one to whom we have referred you. A radiologist (specialist in reading X-rays) may read your X-rays. Make sure you know how you are to obtain your X-ray results. Do not assume everything is normal if you do not hear from us. °SEEK MEDICAL CARE IF: °Bruising, swelling, or pain increases. °SEEK IMMEDIATE MEDICAL CARE IF:  °· Your fingers or toes are numb or blue. °· The pain is not responding to medications and continues to stay the same or get worse. °· The pain in your joint becomes severe. °· You develop a fever over 102° F (38.9° C). °· It becomes impossible to move or use the joint. °MAKE SURE YOU:  °·   Understand these instructions.  Will watch your condition.  Will get help right away if you are not doing well or get worse. Document Released: 12/05/2005 Document Revised: 02/27/2012 Document Reviewed: 07/23/2008 [facility] Patient Information 2015 [place], [place]. This information is not intended to replace advice given to you by your health care provider. Make sure you discuss any questions you have with your health care  provider.  Back Pain, Adult Low back pain is very common. About 1 in 5 people have back pain.The cause of low back pain is rarely dangerous. The pain often gets better over time.About half of people with a sudden onset of back pain feel better in just 2 weeks. About 8 in 10 people feel better by 6 weeks.  CAUSES Some common causes of back pain include:  Strain of the muscles or ligaments supporting the spine.  Wear and tear (degeneration) of the spinal discs.  Arthritis.  Direct injury to the back. DIAGNOSIS Most of the time, the direct cause of low back pain is not known.However, back pain can be treated effectively even when the exact cause of the pain is unknown.Answering your caregiver's questions about your overall health and symptoms is one of the most accurate ways to make sure the cause of your pain is not dangerous. If your caregiver needs more information, he or she may order lab work or imaging tests (X-rays or MRIs).However, even if imaging tests show changes in your back, this usually does not require surgery. HOME CARE INSTRUCTIONS For many people, back pain returns.Since low back pain is rarely dangerous, it is often a condition that people can learn to [facility] their own.   Remain active. It is stressful on the back to sit or stand in one place. Do not sit, drive, or stand in one place for more than 30 minutes at a time. Take short walks on level surfaces as soon as pain allows.Try to increase the length of time you walk each day.  Do not stay in bed.Resting more than 1 or 2 days can delay your recovery.  Do not avoid exercise or work.Your body is made to move.It is not dangerous to be active, even though your back may hurt.Your back will likely heal faster if you return to being active before your pain is gone.  Pay attention to your body when you bend and lift. Many people have less discomfortwhen lifting if they bend their knees, keep the load close to their  bodies,and avoid twisting. Often, the most comfortable positions are those that put less stress on your recovering back.  Find a comfortable position to sleep. Use a firm mattress and lie on your side with your knees slightly bent. If you lie on your back, put a pillow under your knees.  Only take over-the-counter or prescription medicines as directed by your caregiver. Over-the-counter medicines to reduce pain and inflammation are often the most helpful.Your caregiver may prescribe muscle relaxant drugs.These medicines help dull your pain so you can more quickly return to your normal activities and healthy exercise.  Put ice on the injured area.  Put ice in a plastic bag.  Place a towel between your skin and the bag.  Leave the ice on for 15-20 minutes, 03-04 times a day for the first 2 to 3 days. After that, ice and heat may be alternated to reduce pain and spasms.  Ask your caregiver about trying back exercises and gentle massage. This may be of some benefit.  Avoid feeling anxious or  stressed.Stress increases muscle tension and can worsen back pain.It is important to recognize when you are anxious or stressed and learn ways to manage it.Exercise is a great option. SEEK MEDICAL CARE IF:  You have pain that is not relieved with rest or medicine.  You have pain that does not improve in 1 week.  You have new symptoms.  You are generally not feeling well. SEEK IMMEDIATE MEDICAL CARE IF:   You have pain that radiates from your back into your legs.  You develop new bowel or bladder control problems.  You have unusual weakness or numbness in your arms or legs.  You develop nausea or vomiting.  You develop abdominal pain.  You feel faint. Document Released: 12/05/2005 Document Revised: 06/05/2012 Document Reviewed: 04/08/2014 [facility] Patient Information 2015 [place], [place]. This information is not intended to replace advice given to you by your health care provider. Make  sure you discuss any questions you have with your health care provider.  Chronic Pain Chronic pain can be defined as pain that is off and on and lasts for 3-6 months or longer. Many things cause chronic pain, which can make it difficult to make a diagnosis. There are many treatment options available for chronic pain. However, finding a treatment that works well for you may require trying various approaches until the right one is found. Many people benefit from a combination of two or more types of treatment to control their pain. SYMPTOMS  Chronic pain can occur anywhere in the body and can range from mild to very severe. Some types of chronic pain include:  Headache.  Low back pain.  Cancer pain.  Arthritis pain.  Neurogenic pain. This is pain resulting from damage to nerves. People with chronic pain may also have other symptoms such as:  Depression.  Anger.  Insomnia.  Anxiety. DIAGNOSIS  Your health care provider will help diagnose your condition over time. In many cases, the initial focus will be on excluding possible conditions that could be causing the pain. Depending on your symptoms, your health care provider may order tests to diagnose your condition. Some of these tests may include:   Blood tests.   CT scan.   MRI.   X-rays.   Ultrasounds.   Nerve conduction studies.  You may need to see a specialist.  TREATMENT  Finding treatment that works well may take time. You may be referred to a pain specialist. He or she may prescribe medicine or therapies, such as:   Mindful meditation or yoga.  Shots (injections) of numbing or pain-relieving medicines into the spine or area of pain.  Local electrical stimulation.  Acupuncture.   Massage therapy.   Aroma, color, light, or sound therapy.   Biofeedback.   Working with a physical therapist to keep from getting stiff.   Regular, gentle exercise.   Cognitive or behavioral therapy.   Group  support.  Sometimes, surgery may be recommended.  HOME CARE INSTRUCTIONS   Take all medicines as directed by your health care provider.   Lessen stress in your life by relaxing and doing things such as listening to calming music.   Exercise or be active as directed by your health care provider.   Eat a healthy diet and include things such as vegetables, fruits, fish, and lean meats in your diet.   Keep all follow-up appointments with your health care provider.   Attend a support group with others suffering from chronic pain. SEEK MEDICAL CARE IF:   Your pain  gets worse.   You develop a new pain that was not there before.   You cannot tolerate medicines given to you by your health care provider.   You have new symptoms since your last visit with your health care provider.  SEEK IMMEDIATE MEDICAL CARE IF:   You feel weak.   You have decreased sensation or numbness.   You lose control of bowel or bladder function.   Your pain suddenly gets much worse.   You develop shaking.  You develop chills.  You develop confusion.  You develop chest pain.  You develop shortness of breath.  MAKE SURE YOU:  Understand these instructions.  Will watch your condition.  Will get help right away if you are not doing well or get worse. Document Released: 08/27/2002 Document Revised: 08/07/2013 Document Reviewed: 05/31/2013 [facility] Patient Information 2015 [place], [place]. This information is not intended to replace advice given to you by your health care provider. Make sure you discuss any questions you have with your health care provider.

## 2015-06-08 NOTE — ED Provider Notes (Signed)
CSN: [id number]     Arrival date & time 06/08/15  1801 History   First MD Initiated Contact with Patient 06/08/15 1842     Chief Complaint  Patient presents with  . Back Pain     (Consider location/radiation/quality/duration/timing/severity/associated sxs/prior Treatment) HPI Heather [name] is a 54 y.o. female he comes in for evaluation of low back, left hip and left knee pain. Patient states she has chronic back pain and has started to experience gradual onset left-sided hip and knee pain over the past few months. She describes it as a dull ache that turns sharp when she moves. Rates discomfort as a 7/10. Has taken Tylenol without relief.She denies any fevers, chills, nausea or vomiting, urinary symptoms, leg swelling, numbness or weakness. She does report she has been on her feet a lot more lately.   Past Medical History  Diagnosis Date  . Chronic back pain   . Hypertension   . Barrett's esophageal ulceration   . GI bleed   . Pancreatic lesion    Past Surgical History  Procedure Laterality Date  . Back surgery    . 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 Cesarean section    . Ventral hernia repair  2003  . Abdominal hysterectomy  2003    with Left Salpingo  . Laparoscopic incisional / umbilical / ventral hernia repair  2008  . Laparoscopic nissen fundoplication  2000  . Esophagogastroduodenoscopy  02/26/2012    Procedure: ESOPHAGOGASTRODUODENOSCOPY (EGD);  Surgeon: Petra Kuba, MD;  Location: [facility];  Service: Endoscopy;  Laterality: N/A;   Family History  Problem Relation Age of Onset  . Heart failure Mother   . Diabetes Mother   . Asthma Mother   . Heart failure Father   . Heart attack Father   . Heart failure Sister   . Diabetes Sister   . Kidney failure Sister   . Heart failure Brother    History  Substance Use Topics  . Smoking status: Current Every Day Smoker -- 0.50 packs/day    Types: Cigarettes  . Smokeless tobacco: Not on  file  . Alcohol Use: No   OB History    Gravida Para Term Preterm AB TAB SAB Ectopic Multiple Living   6 2 2  4  4         Review of Systems A 10 point review of systems was completed and was negative except for pertinent positives and negatives as mentioned in the history of present illness     Allergies  Penicillins  Home Medications   Prior to Admission medications   Medication Sig Start Date End Date Taking? Authorizing Provider  acetaminophen (TYLENOL) 500 MG tablet Take 500-1,000 mg by mouth every 6 (six) hours as needed for mild pain or moderate pain.    Historical Provider, MD  clindamycin (CLEOCIN) 300 MG capsule Take 1 capsule (300 mg total) by mouth 4 (four) times daily. Patient not taking: Reported on 03/03/2015 07/07/14   Tammy Triplett, PA-C  clindamycin (CLEOCIN) 300 MG capsule Take 1 capsule (300 mg total) by mouth every 6 (six) hours. Patient not taking: Reported on 04/06/2015 07/30/14   Azalia Bilis, MD  clindamycin (CLEOCIN) 300 MG capsule Take 1 capsule (300 mg total) by mouth 4 (four) times daily. X 7 days Patient not taking: Reported on 03/03/2015 09/02/14   Rolan Bucco, MD  cyclobenzaprine (FLEXERIL) 5 MG tablet Take 1 tablet (5 mg total) by mouth 3 (  three) times daily as needed for muscle spasms. Patient not taking: Reported on 04/06/2015 03/29/15   Richardean Canal, MD  HYDROcodone-acetaminophen (NORCO/VICODIN) 5-325 MG per tablet Take 1 tablet by mouth every 6 (six) hours as needed. Patient not taking: Reported on 04/06/2015 03/29/15   Richardean Canal, MD  oxyCODONE-acetaminophen (PERCOCET/ROXICET) 5-325 MG per tablet Take 1 tablet by mouth every 6 (six) hours as needed. 04/06/15   Bethann Berkshire, MD  predniSONE (DELTASONE) 10 MG tablet 6, 5, 4, 3, 2 then 1 tablet by mouth daily for 6 days total. Patient not taking: Reported on 04/06/2015 03/03/15   Burgess Amor, PA-C  traMADol (ULTRAM) 50 MG tablet Take 1 tablet (50 mg total) by mouth every 6 (six) hours as needed. 06/08/15    Joycie Peek, PA-C   BP 125/69 mmHg  Pulse 81  Temp(Src) 99 F (37.2 C) (Oral)  Resp 21  Ht  (1.626 m)  Wt 150 lb (68.04 kg)  BMI 25.73 kg/m2  SpO2 97% Physical Exam  Constitutional:  Awake, alert, nontoxic appearance.  HENT:  Head: Atraumatic.  Eyes: Right eye exhibits no discharge. Left eye exhibits no discharge.  Neck: Neck supple.  Pulmonary/Chest: Effort normal. She exhibits no tenderness.  Abdominal: Soft. There is no tenderness. There is no rebound.  Musculoskeletal: She exhibits no tenderness.  Baseline ROM, no obvious new focal weakness. Tenderness diffusely throughout the left lumbar paraspinal muscles with no midline bony tenderness. Diffuse tenderness throughout left hip and left knee. Patient maintains full active range of motion of left hip and knee. No overt erythema or swelling noted to the joints . The gait appears baseline with some antalgia but no apparent ataxia.  Neurological:  Mental status and motor strength appears baseline for patient and situation.  Skin: No rash noted.  Psychiatric: She has a normal mood and affect.  Nursing note and vitals reviewed.   ED Course  Procedures (including critical care time) Labs Review Labs Reviewed  URINALYSIS, ROUTINE W REFLEX MICROSCOPIC (NOT AT [facility]) - Abnormal; Notable for the following:    Specific Gravity, Urine 1.003 (*)    All other components within normal limits    Imaging Review No results found.   EKG Interpretation None     Meds given in ED:  Medications - No data to display  Discharge Medication List as of 06/08/2015  8:37 PM    START taking these medications   Details  traMADol (ULTRAM) 50 MG tablet Take 1 tablet (50 mg total) by mouth every 6 (six) hours as needed., Starting 06/08/2015, Until Discontinued, Print       Filed Vitals:   06/08/15 1810 06/08/15 2017  BP: 151/96 125/69  Pulse: 101 81  Temp: 98.6 F (37 C) 99 F (37.2 C)  TempSrc: Oral Oral  Resp: 21    Height:  (1.626 m)   Weight: 150 lb (68.04 kg)   SpO2: 97% 97%    MDM  Vitals stable - WNL -afebrile Pt resting comfortably in ED. PE--physical exam is not concerning. No evidence of septic arthritis, DVT or other acute or emergent pathology This appears to be an acute exacerbation of a chronic problem. Due to patient's extensive GI history,Will DC with short course tramadol and ensure she follow up with primary care for further evaluation and management of symptoms.  I discussed all relevant lab findings and imaging results with pt and they verbalized understanding. Discussed f/u with PCP within 48 hrs and return precautions, pt very amenable  to plan.  Final diagnoses:  Chronic back pain  Left hip pain  Left knee pain        Joycie Peek, PA-C 06/09/15 0045  Gwyneth Sprout, MD 06/12/15 [phone]

## 2015-07-02 ENCOUNTER — Emergency Department (HOSPITAL_BASED_OUTPATIENT_CLINIC_OR_DEPARTMENT_OTHER)
Admission: EM | Admit: 2015-07-02 | Discharge: 2015-07-02 | Disposition: A | Discharge status: Home / Self Care | Payer: Self-pay | Attending: Emergency Medicine | Admitting: Emergency Medicine

## 2015-07-02 ENCOUNTER — Encounter (HOSPITAL_BASED_OUTPATIENT_CLINIC_OR_DEPARTMENT_OTHER): Payer: Self-pay | Admitting: *Deleted

## 2015-07-02 ENCOUNTER — Emergency Department (HOSPITAL_BASED_OUTPATIENT_CLINIC_OR_DEPARTMENT_OTHER): Payer: Self-pay

## 2015-07-02 DIAGNOSIS — Z9889 Other specified postprocedural states: Secondary | ICD-10-CM | POA: Insufficient documentation

## 2015-07-02 DIAGNOSIS — Z880 Allergy status to penicillin: Secondary | ICD-10-CM | POA: Insufficient documentation

## 2015-07-02 DIAGNOSIS — R110 Nausea: Secondary | ICD-10-CM | POA: Insufficient documentation

## 2015-07-02 DIAGNOSIS — R1032 Left lower quadrant pain: Secondary | ICD-10-CM | POA: Insufficient documentation

## 2015-07-02 DIAGNOSIS — R109 Unspecified abdominal pain: Secondary | ICD-10-CM

## 2015-07-02 DIAGNOSIS — I10 Essential (primary) hypertension: Secondary | ICD-10-CM | POA: Insufficient documentation

## 2015-07-02 DIAGNOSIS — Z720 Tobacco use: Secondary | ICD-10-CM | POA: Insufficient documentation

## 2015-07-02 DIAGNOSIS — Z90710 Acquired absence of both cervix and uterus: Secondary | ICD-10-CM | POA: Insufficient documentation

## 2015-07-02 DIAGNOSIS — M549 Dorsalgia, unspecified: Secondary | ICD-10-CM | POA: Insufficient documentation

## 2015-07-02 DIAGNOSIS — Z8719 Personal history of other diseases of the digestive system: Secondary | ICD-10-CM | POA: Insufficient documentation

## 2015-07-02 DIAGNOSIS — G8929 Other chronic pain: Secondary | ICD-10-CM | POA: Insufficient documentation

## 2015-07-02 DIAGNOSIS — Z7952 Long term (current) use of systemic steroids: Secondary | ICD-10-CM | POA: Insufficient documentation

## 2015-07-02 DIAGNOSIS — Z792 Long term (current) use of antibiotics: Secondary | ICD-10-CM | POA: Insufficient documentation

## 2015-07-02 LAB — CBC WITH DIFFERENTIAL/PLATELET
BASOS PCT: 1 % (ref 0–1)
Basophils Absolute: 0.1 10*3/uL (ref 0.0–0.1)
EOS PCT: 3 % (ref 0–5)
Eosinophils Absolute: 0.2 10*3/uL (ref 0.0–0.7)
HCT: 43.1 % (ref 36.0–46.0)
HEMOGLOBIN: 14.7 g/dL (ref 12.0–15.0)
Lymphocytes Relative: 42 % (ref 12–46)
Lymphs Abs: 2.9 10*3/uL (ref 0.7–4.0)
MCH: 31.1 pg (ref 26.0–34.0)
MCHC: 34.1 g/dL (ref 30.0–36.0)
MCV: 91.1 fL (ref 78.0–100.0)
Monocytes Absolute: 0.3 10*3/uL (ref 0.1–1.0)
Monocytes Relative: 5 % (ref 3–12)
NEUTROS ABS: 3.5 10*3/uL (ref 1.7–7.7)
NEUTROS PCT: 49 % (ref 43–77)
Platelets: 271 10*3/uL (ref 150–400)
RBC: 4.73 MIL/uL (ref 3.87–5.11)
RDW: 12.2 % (ref 11.5–15.5)
WBC: 7 10*3/uL (ref 4.0–10.5)

## 2015-07-02 LAB — BASIC METABOLIC PANEL
Anion gap: 8 (ref 5–15)
BUN: 8 mg/dL (ref 6–20)
CALCIUM: 9.6 mg/dL (ref 8.9–10.3)
CO2: 28 mmol/L (ref 22–32)
CREATININE: 0.73 mg/dL (ref 0.44–1.00)
Chloride: 106 mmol/L (ref 101–111)
GFR calc non Af Amer: >60 mL/min (ref >60)
GLUCOSE: 95 mg/dL (ref 65–99)
Potassium: 3.7 mmol/L (ref 3.5–5.1)
SODIUM: 142 mmol/L (ref 135–145)

## 2015-07-02 MED ORDER — SODIUM CHLORIDE 0.9 % IV BOLUS (SEPSIS)
250.0000 mL | Freq: Once | INTRAVENOUS | Status: AC
  Administered 2015-07-02: 250 mL via INTRAVENOUS

## 2015-07-02 MED ORDER — NAPROXEN 500 MG PO TABS: 500.0000 mg | ORAL_TABLET | Freq: Two times a day (BID) | ORAL | Status: DC

## 2015-07-02 MED ORDER — SODIUM CHLORIDE 0.9 % IV SOLN
INTRAVENOUS | Status: DC
  Administered 2015-07-02: 18:00:00 via INTRAVENOUS

## 2015-07-02 MED ORDER — IOHEXOL 300 MG/ML  SOLN
25.0000 mL | Freq: Once | INTRAMUSCULAR | Status: AC | PRN
Start: 2015-07-02 — End: 2015-07-02
  Administered 2015-07-02: 25 mL via ORAL

## 2015-07-02 MED ORDER — ONDANSETRON HCL 4 MG/2ML IJ SOLN: 4.0000 mg | Freq: Once | INTRAMUSCULAR | Status: DC

## 2015-07-02 MED ORDER — IOHEXOL 300 MG/ML  SOLN
100.0000 mL | Freq: Once | INTRAMUSCULAR | Status: AC | PRN
  Administered 2015-07-02: 100 mL via INTRAVENOUS

## 2015-07-02 NOTE — ED Notes (Signed)
Pt. Was seen here in June for same problem and reports she has no other Dr. To see.   Asked Pt. About a Family Dr. And she Reports she does not have one.  Pt. Stated she has no INS and no money for one.

## 2015-07-02 NOTE — Discharge Instructions (Signed)
Workup for the abdominal pain left groin pain and leg pain without specific findings x-rays of the hips and pelvis were negative x-rays of lumbar back the without any significant findings that were new. CT scan of the abdomen and pelvis without any acute findings. Recommend taking the Naprosyn over the next week or 2 and working towards getting follow-up with your new provider.

## 2015-07-02 NOTE — ED Notes (Signed)
Patient transported to X-ray 

## 2015-07-02 NOTE — ED Provider Notes (Signed)
CSN: [id number]     Arrival date & time 07/02/15  1524 History   First MD Initiated Contact with Patient 07/02/15 1543     Chief Complaint  Patient presents with  . Leg Pain     (Consider location/radiation/quality/duration/timing/severity/associated sxs/prior Treatment) Patient is a 54 y.o. female presenting with leg pain. The history is provided by the patient.  Leg Pain Associated symptoms: back pain   Associated symptoms: no fever    patient with the 2 month history of left groin pain radiates down the anterior thigh and then also pain in the posterior calf. Patient's had problems with chronic back pain in the past. But she's concerned about something going on in the abdomen. Pain is 8 out of 10 worse with the certain movements. Associated with some nausea but no vomiting or diarrhea no fevers. Patient has had a hysterectomy in the past. Patient's also had a hernia repair patient recently been assigned a new doctor because she has qualified for disability. But has not had an appointment with them yet.  Past Medical History  Diagnosis Date  . Chronic back pain   . Hypertension   . Barrett's esophageal ulceration   . GI bleed   . Pancreatic lesion    Past Surgical History  Procedure Laterality Date  . Back surgery    . Abdominal surgery      at age 54 for malrotation; appendectomy  . Cesarean section    . Ventral hernia repair  2003  . Abdominal hysterectomy  2003    with Left Salpingo  . Laparoscopic incisional / umbilical / ventral hernia repair  2008  . Laparoscopic nissen fundoplication  2000  . Esophagogastroduodenoscopy  02/26/2012    Procedure: ESOPHAGOGASTRODUODENOSCOPY (EGD);  Surgeon: Petra KubaMarc E Magod, MD;  Location: [facility];  Service: Endoscopy;  Laterality: N/A;   Family History  Problem Relation Age of Onset  . Heart failure Mother   . Diabetes Mother   . Asthma Mother   . Heart failure Father   . Heart attack Father   . Heart failure Sister   . Diabetes  Sister   . Kidney failure Sister   . Heart failure Brother    History  Substance Use Topics  . Smoking status: Current Every Day Smoker -- 0.50 packs/day    Types: Cigarettes  . Smokeless tobacco: Not on file  . Alcohol Use: No   OB History    Gravida Para Term Preterm AB TAB SAB Ectopic Multiple Living   6 2 2  4  4         Review of Systems  Constitutional: Negative for fever.  HENT: Negative for congestion.   Eyes: Negative for visual disturbance.  Respiratory: Negative for shortness of breath.   Cardiovascular: Negative for chest pain.  Gastrointestinal: Positive for nausea and abdominal pain.  Genitourinary: Negative for dysuria.  Musculoskeletal: Positive for back pain.  Skin: Negative for rash.  Neurological: Negative for weakness, numbness and headaches.  Hematological: Does not bruise/bleed easily.  Psychiatric/Behavioral: Negative for confusion.      Allergies  Penicillins  Home Medications   Prior to Admission medications   Medication Sig Start Date End Date Taking? Authorizing Provider  acetaminophen (TYLENOL) 500 MG tablet Take 500-1,000 mg by mouth every 6 (six) hours as needed for mild pain or moderate pain.    Historical Provider, MD  clindamycin (CLEOCIN) 300 MG capsule Take 1 capsule (300 mg total) by mouth 4 (four) times daily. Patient not taking: Reported  on 03/03/2015 07/07/14   Tammy Triplett, PA-C  clindamycin (CLEOCIN) 300 MG capsule Take 1 capsule (300 mg total) by mouth every 6 (six) hours. Patient not taking: Reported on 04/06/2015 07/30/14   Azalia Bilis, MD  clindamycin (CLEOCIN) 300 MG capsule Take 1 capsule (300 mg total) by mouth 4 (four) times daily. X 7 days Patient not taking: Reported on 03/03/2015 09/02/14   Rolan Bucco, MD  cyclobenzaprine (FLEXERIL) 5 MG tablet Take 1 tablet (5 mg total) by mouth 3 (three) times daily as needed for muscle spasms. Patient not taking: Reported on 04/06/2015 03/29/15   Richardean Canal, MD   HYDROcodone-acetaminophen (NORCO/VICODIN) 5-325 MG per tablet Take 1 tablet by mouth every 6 (six) hours as needed. Patient not taking: Reported on 04/06/2015 03/29/15   Richardean Canal, MD  naproxen (NAPROSYN) 500 MG tablet Take 1 tablet (500 mg total) by mouth 2 (two) times daily. 07/02/15   Vanetta Mulders, MD  oxyCODONE-acetaminophen (PERCOCET/ROXICET) 5-325 MG per tablet Take 1 tablet by mouth every 6 (six) hours as needed. 04/06/15   Bethann Berkshire, MD  predniSONE (DELTASONE) 10 MG tablet 6, 5, 4, 3, 2 then 1 tablet by mouth daily for 6 days total. Patient not taking: Reported on 04/06/2015 03/03/15   Burgess Amor, PA-C  traMADol (ULTRAM) 50 MG tablet Take 1 tablet (50 mg total) by mouth every 6 (six) hours as needed. 06/08/15   Benjamin Cartner, PA-C   BP 131/84 mmHg  Pulse 89  Temp(Src) 98.4 F (36.9 C) (Oral)  Resp 16  Ht 5\' 5"  (1.651 m)  Wt 150 lb (68.04 kg)  BMI 24.96 kg/m2  SpO2 99% Physical Exam  Constitutional: She is oriented to person, place, and time. She appears well-developed and well-nourished. No distress.  HENT:  Head: Normocephalic and atraumatic.  Mouth/Throat: Oropharynx is clear and moist.  Eyes: Conjunctivae and EOM are normal. Pupils are equal, round, and reactive to light.  Neck: Normal range of motion. Neck supple.  Cardiovascular: Normal rate and regular rhythm.   No murmur heard. Pulmonary/Chest: Effort normal and breath sounds normal.  Abdominal: Soft. Bowel sounds are normal. There is no tenderness.  Musculoskeletal: Normal range of motion. She exhibits no edema.  Neurological: She is alert and oriented to person, place, and time. No cranial nerve deficit. She exhibits normal muscle tone. Coordination normal.  Skin: Skin is warm. No rash noted.  Nursing note and vitals reviewed.   ED Course  Procedures (including critical care time) Labs Review Labs Reviewed  CBC WITH DIFFERENTIAL/PLATELET  BASIC METABOLIC PANEL    Imaging Review Dg Lumbar Spine  Complete  07/02/2015   CLINICAL DATA:  Low back pain with hip pain several months getting worse. No injury.  EXAM: LUMBAR SPINE - COMPLETE 4+ VIEW  COMPARISON:  03/03/2015  FINDINGS: Subtle curvature of the lumbar spine convex right unchanged. There is mild spondylosis of the lumbar spine. Vertebral body heights are within normal. There is subtle disc space narrowing at the L2-3 level unchanged. Posterior fusion hardware is intact and unchanged at the L5-S1 level. Prosthetic disc material at the L5-S1 level.  IMPRESSION: No acute findings.  Mild spondylosis of the lumbar spine with minimal disc disease at the L2-3 level. Posterior spinal fusion hardware intact at the L5-S1 level.   Electronically Signed   By: Elberta Fortis M.D.   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  CONTRAST:  25mL OMNIPAQUE IOHEXOL 300 MG/ML SOLN, OMNIPAQUE IOHEXOL 300 MG/ML SOLN  COMPARISON:  02/25/2012  FINDINGS: Lung bases: Minimal subsegmental atelectasis. Heart normal in size.  Liver: Mild fatty infiltration. No mass or lesion. Liver normal in size.  Spleen and gallbladder:  Normal.  Biliary tree: Mild dilation of the common bile duct and pancreatic duct, stable the prior CT. No intrahepatic bile duct dilation.  Pancreas:  No masses or inflammation.  Adrenal glands:  Normal.  Kidneys, ureters, bladder: 8 mm anterior midpole cyst on the left. No other renal masses. No stones. No hydronephrosis. Normal ureters. Normal bladder.  Uterus and adnexa:  Uterus surgically absent.  No pelvic  masses.  Gastrointestinal tract: 2 stable surgical vascular clips lie adjacent to the gastroesophageal junction. Other stable clips lie adjacent to the spleen. There is no colonic or small bowel dilation to suggest obstruction or significant generalized adynamic ileus. The duodenum extends inferior from the pylorus, and does not cross to the left of midline. This is consistent with malrotation. The cecum lies in the central abdomen. There is prominent fat at the ileocecal valve. No bowel wall thickening or mesenteric inflammation.  Lymph nodes:  No enlarged or abnormal appearing lymph nodes.  Ascites:  None.  Abdominal wall: Hernia mesh extends along the anterior abdominal wall. This is stable. No hernia.  Musculoskeletal: Status post posterior fusion at L5-S1. This is stable. No osteoblastic or osteolytic lesions.  IMPRESSION: 1. No acute findings. No findings to explain the patient's left lower quadrant pain. 2. 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   Electronically Signed   By: Amie Portland M.D.   On: 07/02/2015 17:36   Dg Hips Bilat With Pelvis Min 5 Views  07/02/2015   CLINICAL DATA:  Low back and hip pain for a few months getting worse. No injury.  EXAM: DG HIP (WITH OR WITHOUT PELVIS) 5+V BILAT  COMPARISON:  09/05/2011  FINDINGS: Bones, joint spaces and soft tissues over the hips are within normal. Fusion hardware unchanged over the lumbosacral spine.  IMPRESSION: No acute findings.   Electronically Signed   By: Elberta Fortis M.D.   On: 07/02/2015 16:38     EKG Interpretation None      MDM   Final diagnoses:  Abdominal pain  Chronic back pain  Groin pain, left    Patients left groin pain without any significant findings on CT scan to explain the pain. Patient's other symptoms may be related to  chronic back pain problem. X-rays of the pelvis and hips and lumbar back without any acute findings. Patient will be treated with anti-inflammatory medicine currently working towards disability and has been assigned a new doctor. She will follow-up with them. Labs also without any significant abnormalities.    Vanetta Mulders, MD 07/02/15 1827

## 2015-07-02 NOTE — ED Notes (Signed)
Patient transported to CT 

## 2015-08-08 ENCOUNTER — Encounter (HOSPITAL_COMMUNITY): Payer: Self-pay | Admitting: Emergency Medicine

## 2015-08-08 ENCOUNTER — Emergency Department (HOSPITAL_COMMUNITY): Payer: Self-pay

## 2015-08-08 ENCOUNTER — Emergency Department (HOSPITAL_COMMUNITY)
Admission: EM | Admit: 2015-08-08 | Discharge: 2015-08-08 | Disposition: A | Discharge status: Home / Self Care | Payer: Self-pay | Attending: Emergency Medicine | Admitting: Emergency Medicine

## 2015-08-08 DIAGNOSIS — I10 Essential (primary) hypertension: Secondary | ICD-10-CM | POA: Insufficient documentation

## 2015-08-08 DIAGNOSIS — Z720 Tobacco use: Secondary | ICD-10-CM | POA: Insufficient documentation

## 2015-08-08 DIAGNOSIS — G8929 Other chronic pain: Secondary | ICD-10-CM | POA: Insufficient documentation

## 2015-08-08 DIAGNOSIS — M79605 Pain in left leg: Secondary | ICD-10-CM | POA: Insufficient documentation

## 2015-08-08 DIAGNOSIS — Z791 Long term (current) use of non-steroidal anti-inflammatories (NSAID): Secondary | ICD-10-CM | POA: Insufficient documentation

## 2015-08-08 DIAGNOSIS — Z880 Allergy status to penicillin: Secondary | ICD-10-CM | POA: Insufficient documentation

## 2015-08-08 DIAGNOSIS — Z8719 Personal history of other diseases of the digestive system: Secondary | ICD-10-CM | POA: Insufficient documentation

## 2015-08-08 MED ORDER — IBUPROFEN 400 MG PO TABS: 600.0000 mg | ORAL_TABLET | Freq: Once | ORAL | Status: DC

## 2015-08-08 MED ORDER — TRAMADOL HCL 50 MG PO TABS: 50.0000 mg | ORAL_TABLET | Freq: Four times a day (QID) | ORAL | Status: DC | PRN

## 2015-08-08 MED ORDER — HYDROCODONE-ACETAMINOPHEN 5-325 MG PO TABS
2.0000 | ORAL_TABLET | Freq: Once | ORAL | Status: AC
  Administered 2015-08-08: 2 via ORAL
  Filled 2015-08-08: qty 2

## 2015-08-08 NOTE — ED Notes (Addendum)
Patient states that she has had left leg pain for 2 months that has gotten worse over the last couple of weeks. Patient states the pain started behind her knee and has went up towards her hip and down towards her ankle. Patient states no injury/trauma to this leg.

## 2015-08-08 NOTE — ED Notes (Signed)
US completed at bedside.

## 2015-08-08 NOTE — Discharge Instructions (Signed)
If you were given medicines take as directed.  If you are on coumadin or contraceptives realize their levels and effectiveness is altered by many different medicines.  If you have any reaction (rash, tongues swelling, other) to the medicines stop taking and see a physician.    If your blood pressure was elevated in the ER make sure you follow up for management with a primary doctor or return for chest pain, shortness of breath or stroke symptoms.  Please follow up as directed and return to the ER or see a physician for new or worsening symptoms.  Thank you. Filed Vitals:   08/08/15 0801  BP: 141/80  Pulse: 92  Temp: 98.1 F (36.7 C)  TempSrc: Oral  Resp: 16  Height:  (1.626 m)  Weight: 145 lb (65.772 kg)  SpO2: 99%

## 2015-08-08 NOTE — ED Notes (Signed)
Pt c/o left leg pain that started several weeks ago. Pt reports the pain starts behind the knee and radiates down to her foot and up to her hip area. She describes the pain as throbbing. Pt denies injury to the leg. Pulses WNL.

## 2015-08-08 NOTE — ED Provider Notes (Signed)
CSN: [id number]     Arrival date & time 08/08/15  7253 History   First MD Initiated Contact with Patient 08/08/15 [phone]     Chief Complaint  Patient presents with  . Leg Pain     (Consider location/radiation/quality/duration/timing/severity/associated sxs/prior Treatment) HPI Comments: 54 year old female with history of small bowel obstruction, Barrett's esophagus presents with left leg pain the past 2 weeks. Gradually worsening, worse with palpation walking. No arthritis history, patient has had back issues in the past but no new back pain. This is different than her sciatica/back pain. No cold foot. No vascular disease. No injuries, no cancer history, no recent surgeries no swelling.  Patient is a 54 y.o. female presenting with leg pain. The history is provided by the patient.  Leg Pain Associated symptoms: no back pain, no fever and no neck pain     Past Medical History  Diagnosis Date  . Chronic back pain   . Hypertension   . Barrett's esophageal ulceration   . GI bleed   . Pancreatic lesion    Past Surgical History  Procedure Laterality Date  . Back surgery    . 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 Cesarean section    . Ventral hernia repair  2003  . Abdominal hysterectomy  2003    with Left Salpingo  . Laparoscopic incisional / umbilical / ventral hernia repair  2008  . Laparoscopic nissen fundoplication  2000  . Esophagogastroduodenoscopy  02/26/2012    Procedure: ESOPHAGOGASTRODUODENOSCOPY (EGD);  Surgeon: Petra Kuba, MD;  Location: [facility];  Service: Endoscopy;  Laterality: N/A;   Family History  Problem Relation Age of Onset  . Heart failure Mother   . Diabetes Mother   . Asthma Mother   . Heart failure Father   . Heart attack Father   . Heart failure Sister   . Diabetes Sister   . Kidney failure Sister   . Heart failure Brother    Social History  Substance Use Topics  . Smoking status: Current Every Day Smoker -- 0.50  packs/day    Types: Cigarettes  . Smokeless tobacco: None  . Alcohol Use: No   OB History    Gravida Para Term Preterm AB TAB SAB Ectopic Multiple Living   6 2 2  4  4         Review of Systems  Constitutional: Negative for fever and chills.  HENT: Negative for congestion.   Eyes: Negative for visual disturbance.  Respiratory: Negative for shortness of breath.   Cardiovascular: Negative for chest pain.  Gastrointestinal: Negative for vomiting and abdominal pain.  Genitourinary: Negative for dysuria and flank pain.  Musculoskeletal: Positive for arthralgias. Negative for back pain, joint swelling, neck pain and neck stiffness.  Skin: Negative for rash.  Neurological: Negative for weakness, light-headedness, numbness and headaches.      Allergies  Penicillins  Home Medications   Prior to Admission medications   Medication Sig Start Date End Date Taking? Authorizing Provider  acetaminophen (TYLENOL) 500 MG tablet Take 500-1,000 mg by mouth every 6 (six) hours as needed for mild pain or moderate pain.    Historical Provider, MD  clindamycin (CLEOCIN) 300 MG capsule Take 1 capsule (300 mg total) by mouth 4 (four) times daily. Patient not taking: Reported on 03/03/2015 07/07/14   Tammy Triplett, PA-C  clindamycin (CLEOCIN) 300 MG capsule Take 1 capsule (300 mg total) by mouth every 6 (six) hours. Patient not taking:  Reported on 04/06/2015 07/30/14   Azalia Bilis, MD  clindamycin (CLEOCIN) 300 MG capsule Take 1 capsule (300 mg total) by mouth 4 (four) times daily. X 7 days Patient not taking: Reported on 03/03/2015 09/02/14   Rolan Bucco, MD  cyclobenzaprine (FLEXERIL) 5 MG tablet Take 1 tablet (5 mg total) by mouth 3 (three) times daily as needed for muscle spasms. Patient not taking: Reported on 04/06/2015 03/29/15   Richardean Canal, MD  HYDROcodone-acetaminophen (NORCO/VICODIN) 5-325 MG per tablet Take 1 tablet by mouth every 6 (six) hours as needed. Patient not taking: Reported on  04/06/2015 03/29/15   Richardean Canal, MD  naproxen (NAPROSYN) 500 MG tablet Take 1 tablet (500 mg total) by mouth 2 (two) times daily. 07/02/15   Vanetta Mulders, MD  oxyCODONE-acetaminophen (PERCOCET/ROXICET) 5-325 MG per tablet Take 1 tablet by mouth every 6 (six) hours as needed. 04/06/15   Bethann Berkshire, MD  predniSONE (DELTASONE) 10 MG tablet 6, 5, 4, 3, 2 then 1 tablet by mouth daily for 6 days total. Patient not taking: Reported on 04/06/2015 03/03/15   Burgess Amor, PA-C  traMADol (ULTRAM) 50 MG tablet Take 1 tablet (50 mg total) by mouth every 6 (six) hours as needed. 06/08/15   Benjamin Cartner, PA-C   BP 141/80 mmHg  Pulse 92  Temp(Src) 98.1 F (36.7 C) (Oral)  Resp 16  Ht  (1.626 m)  Wt 145 lb (65.772 kg)  BMI 24.88 kg/m2  SpO2 99% Physical Exam  Constitutional: She is oriented to person, place, and time. She appears well-developed and well-nourished.  HENT:  Head: Normocephalic and atraumatic.  Eyes: Right eye exhibits no discharge. Left eye exhibits no discharge.  Neck: Neck supple. No tracheal deviation present.  Cardiovascular: Normal rate.   Pulmonary/Chest: Effort normal.  Musculoskeletal: She exhibits tenderness. She exhibits no edema.  Patient has tenderness left popliteal fossa, left calf, anterior lateral knee joint bilateral no joint effusion no warmth or signs of infection. Full range of motion left knee without ligament laxity. 2+ PT and DP pulses left leg warm skin.  Neurological: She is alert and oriented to person, place, and time.  Skin: Skin is warm. No rash noted.  Psychiatric: She has a normal mood and affect.  Nursing note and vitals reviewed.   ED Course  Procedures (including critical care time) Labs Review Labs Reviewed - No data to display  Imaging Review US Venous Img Lower Unilateral Left  08/08/2015   CLINICAL DATA:  54 year old female with a history of left lower extremity knee pain for 2 months.  EXAM: LEFT LOWER EXTREMITY VENOUS DOPPLER  ULTRASOUND  TECHNIQUE: Gray-scale sonography with graded compression, as well as color Doppler and duplex ultrasound were performed to evaluate the lower extremity deep venous systems from the level of the common femoral vein and including the common femoral, femoral, profunda femoral, popliteal and calf veins including the posterior tibial, peroneal and gastrocnemius veins when visible. The superficial great saphenous vein was also interrogated. Spectral Doppler was utilized to evaluate flow at rest and with distal augmentation maneuvers in the common femoral, femoral and popliteal veins.  COMPARISON:  None.  FINDINGS: Contralateral Common Femoral Vein: Respiratory phasicity is normal and symmetric with the symptomatic side. No evidence of thrombus. Normal compressibility.  Common Femoral Vein: No evidence of thrombus. Normal compressibility, respiratory phasicity and response to augmentation.  Saphenofemoral Junction: No evidence of thrombus. Normal compressibility and flow on color Doppler imaging.  Profunda Femoral Vein: No evidence of thrombus.  Normal compressibility and flow on color Doppler imaging.  Femoral Vein: No evidence of thrombus. Normal compressibility, respiratory phasicity and response to augmentation.  Popliteal Vein: No evidence of thrombus. Normal compressibility, respiratory phasicity and response to augmentation.  Calf Veins: No evidence of thrombus. Normal compressibility and flow on color Doppler imaging.  Superficial Great Saphenous Vein: No evidence of thrombus. Normal compressibility and flow on color Doppler imaging.  Other Findings:  None.  IMPRESSION: Sonographic survey of the left lower extremity negative for DVT.  Signed,  Yvone Neu. Loreta Ave, DO  [facility]  [facility]   Electronically Signed   By: Gilmer Mor D.O.   On: 08/08/2015 10:21   Dg Knee Complete 4 Views Left  08/08/2015   CLINICAL DATA:  Left knee pain for 2 months.  No  known injury.  EXAM: LEFT KNEE - COMPLETE 4+ VIEW  COMPARISON:  None.  FINDINGS: There is no evidence of fracture, dislocation, or joint effusion. There is no evidence of arthropathy or other focal bone abnormality. Soft tissues are unremarkable.  IMPRESSION: Negative.   Electronically Signed   By: Myles Rosenthal M.D.   On: 08/08/2015 09:12   I have personally reviewed and evaluated these images and lab results as part of my medical decision-making.   EKG Interpretation None      MDM   Final diagnoses:  Left leg pain   patient presents with worsening left leg pain mostly posterior to the past 2 weeks. Says different than her normal pain that she's had with her back issues. Patient low risk for blood clots, ultrasound ordered, screening x-ray. Discussed close outpatient follow-up. [place] no dvt. No clinical evidence of acute arterial occlusion.  MSK/ sciatica related clinically. Results and differential diagnosis were discussed with the patient/parent/guardian. Xrays were independently reviewed by myself.  Close follow up outpatient was discussed, comfortable with the plan.   Medications  HYDROcodone-acetaminophen (NORCO/VICODIN) 5-325 MG per tablet 2 tablet (2 tablets Oral Given 08/08/15 0820)    Filed Vitals:   08/08/15 0801  BP: 141/80  Pulse: 92  Temp: 98.1 F (36.7 C)  TempSrc: Oral  Resp: 16  Height: 5\' 4"  (1.626 m)  Weight: 145 lb (65.772 kg)  SpO2: 99%    Final diagnoses:  Left leg pain      Blane Ohara, MD 08/08/15 1032

## 2015-12-22 ENCOUNTER — Emergency Department (HOSPITAL_BASED_OUTPATIENT_CLINIC_OR_DEPARTMENT_OTHER)
Admission: EM | Admit: 2015-12-22 | Discharge: 2015-12-22 | Disposition: A | Discharge status: Home / Self Care | Payer: Self-pay | Attending: Emergency Medicine | Admitting: Emergency Medicine

## 2015-12-22 ENCOUNTER — Encounter (HOSPITAL_BASED_OUTPATIENT_CLINIC_OR_DEPARTMENT_OTHER): Payer: Self-pay | Admitting: *Deleted

## 2015-12-22 DIAGNOSIS — Z8719 Personal history of other diseases of the digestive system: Secondary | ICD-10-CM | POA: Insufficient documentation

## 2015-12-22 DIAGNOSIS — Z880 Allergy status to penicillin: Secondary | ICD-10-CM | POA: Insufficient documentation

## 2015-12-22 DIAGNOSIS — I10 Essential (primary) hypertension: Secondary | ICD-10-CM | POA: Insufficient documentation

## 2015-12-22 DIAGNOSIS — G8929 Other chronic pain: Secondary | ICD-10-CM | POA: Insufficient documentation

## 2015-12-22 DIAGNOSIS — F17210 Nicotine dependence, cigarettes, uncomplicated: Secondary | ICD-10-CM | POA: Insufficient documentation

## 2015-12-22 DIAGNOSIS — M546 Pain in thoracic spine: Secondary | ICD-10-CM | POA: Insufficient documentation

## 2015-12-22 DIAGNOSIS — Z79899 Other long term (current) drug therapy: Secondary | ICD-10-CM | POA: Insufficient documentation

## 2015-12-22 DIAGNOSIS — M5412 Radiculopathy, cervical region: Secondary | ICD-10-CM | POA: Insufficient documentation

## 2015-12-22 MED ORDER — HYDROCODONE-ACETAMINOPHEN 5-325 MG PO TABS: 1.0000 | ORAL_TABLET | ORAL | Status: DC | PRN

## 2015-12-22 MED ORDER — PREDNISONE 20 MG PO TABS: ORAL_TABLET | ORAL | Status: DC

## 2015-12-22 MED ORDER — ORPHENADRINE CITRATE ER 100 MG PO TB12: 100.0000 mg | ORAL_TABLET | Freq: Two times a day (BID) | ORAL | Status: DC

## 2015-12-22 MED FILL — predniSONE 20 MG TABS: 20 | 15 days supply | Qty: 27 | Fill #0

## 2015-12-22 MED FILL — HYDROCODON-APAP 5-325: 5-325 | 2 days supply | Qty: 20 | Fill #0

## 2015-12-22 MED FILL — ORPHENADRINE 100 MG TAB SA: 100 | 15 days supply | Qty: 30 | Fill #0

## 2015-12-22 NOTE — ED Notes (Signed)
Sharp pain in her upper, back, neck and both arms. Feels like an electrical shock that comes and goes. Pain when she turns her head. Symptoms x 3 days.

## 2015-12-22 NOTE — Discharge Instructions (Signed)

## 2015-12-22 NOTE — ED Provider Notes (Signed)
CSN: [id number]     Arrival date & time 12/22/15  1245 History   First MD Initiated Contact with Patient 12/22/15 1625     Chief Complaint  Patient presents with  . Back Pain     (Consider location/radiation/quality/duration/timing/severity/associated sxs/prior Treatment) HPI Patient awakened about 3 days ago and she noticed just mild ache in the back of her neck. At onset it was not alarming. She reports however as of yesterday and today,  when she turns a certain way she gets a shocklike sensation that runs down her arms. The reports mostly she is noticing it to the right arm but occasionally she gets some symptoms to her left arm as well. She also reports it radiates up her neck towards her head. She reports she is not having specific weakness but  has felt like her hands just are quite working like they should. She has not had any procedures on her upper back or neck. She reports however she did have to have laminectomies done on her lower back by Dr. Dutch Quint. Past Medical History  Diagnosis Date  . Chronic back pain   . Hypertension   . Barrett's esophageal ulceration   . GI bleed   . Pancreatic lesion    Past Surgical History  Procedure Laterality Date  . Back surgery    . Abdominal surgery      at age 45 for malrotation; appendectomy  . Cesarean section    . Ventral hernia repair  2003  . Abdominal hysterectomy  2003    with Left Salpingo  . Laparoscopic incisional / umbilical / ventral hernia repair  2008  . Laparoscopic nissen fundoplication  2000  . Esophagogastroduodenoscopy  02/26/2012    Procedure: ESOPHAGOGASTRODUODENOSCOPY (EGD);  Surgeon: Petra Kuba, MD;  Location: [facility];  Service: Endoscopy;  Laterality: N/A;   Family History  Problem Relation Age of Onset  . Heart failure Mother   . Diabetes Mother   . Asthma Mother   . Heart failure Father   . Heart attack Father   . Heart failure Sister   . Diabetes Sister   . Kidney failure Sister   . Heart  failure Brother    Social History  Substance Use Topics  . Smoking status: Current Every Day Smoker -- 0.50 packs/day    Types: Cigarettes  . Smokeless tobacco: None  . Alcohol Use: No   OB History    Gravida Para Term Preterm AB TAB SAB Ectopic Multiple Living   6 2 2  4  4         Review of Systems 10 Systems reviewed and are negative for acute change except as noted in the HPI.    Allergies  Penicillins  Home Medications   Prior to Admission medications   Medication Sig Start Date End Date Taking? Authorizing Provider  acetaminophen (TYLENOL) 500 MG tablet Take 500-1,000 mg by mouth every 6 (six) hours as needed for mild pain or moderate pain.    Historical Provider, MD  HYDROcodone-acetaminophen (NORCO/VICODIN) 5-325 MG tablet Take 1-2 tablets by mouth every 4 (four) hours as needed for moderate pain or severe pain. 12/22/15   Arby Barrette, MD  naproxen (NAPROSYN) 500 MG tablet Take 1 tablet (500 mg total) by mouth 2 (two) times daily. Patient not taking: Reported on 08/08/2015 07/02/15   Vanetta Mulders, MD  orphenadrine (NORFLEX) 100 MG tablet Take 1 tablet (100 mg total) by mouth 2 (two) times daily. 12/22/15   Arby Barrette, MD  predniSONE (DELTASONE) 20 MG tablet 3 tabs po daily x 3 days, then 2 tabs x 3 days, then 1.5 tabs x 3 days, then 1 tab x 3 days, then 0.5 tabs x 3 days 12/22/15   Arby BarretteMarcy Phinehas Grounds, MD  traMADol (ULTRAM) 50 MG tablet Take 1 tablet (50 mg total) by mouth every 6 (six) hours as needed. 08/08/15   Blane OharaJoshua Zavitz, MD   BP 159/91 mmHg  Pulse 80  Temp(Src) 98.1 F (36.7 C) (Oral)  Resp 18  SpO2 99% Physical Exam  Constitutional: She is oriented to person, place, and time. She appears well-developed and well-nourished.  HENT:  Head: Normocephalic and atraumatic.  Eyes: EOM are normal. Pupils are equal, round, and reactive to light.  Neck: Neck supple.  Patient endorses some tenderness to palpation at about the C 7 to C6 area. With twisting motion this  exacerbates the pain. She reports it radiates upwards towards her upper neck.  Cardiovascular: Normal rate, regular rhythm, normal heart sounds and intact distal pulses.   Pulmonary/Chest: Effort normal and breath sounds normal.  Abdominal: Soft. Bowel sounds are normal. She exhibits no distension. There is no tenderness.  Musculoskeletal: Normal range of motion. She exhibits no edema.  Neurological: She is alert and oriented to person, place, and time. She has normal strength. No cranial nerve deficit. She exhibits normal muscle tone. Coordination normal. GCS eye subscore is 4. GCS verbal subscore is 5. GCS motor subscore is 6.  Upper extremity strength is 5 out of 5 with intact sensation to light touch.  Skin: Skin is warm, dry and intact.  Psychiatric: She has a normal mood and affect.    ED Course  Procedures (including critical care time) Labs Review Labs Reviewed - No data to display  Imaging Review No results found. I have personally reviewed and evaluated these images and lab results as part of my medical decision-making.   EKG Interpretation None      MDM   Final diagnoses:  Cervical radiculopathy   Findings consistent with cervical radiculopathy. Patient is otherwise well appearance. Pain is reproducible on examination. A she has intact motor strength and sensation at this time. At this time she does not need emergent MRI. She is counseled to contact Dr. Dutch QuintPoole for close follow-up and to see her family physician as well. She is counseled on signs and symptoms for which return. She'll be given a prednisone taper, Norflex and Vicodin for pain control.    Arby BarretteMarcy Monterio Bob, MD 12/22/15 1655

## 2016-08-18 ENCOUNTER — Encounter (HOSPITAL_BASED_OUTPATIENT_CLINIC_OR_DEPARTMENT_OTHER): Payer: Self-pay | Admitting: Emergency Medicine

## 2016-08-18 ENCOUNTER — Emergency Department (HOSPITAL_BASED_OUTPATIENT_CLINIC_OR_DEPARTMENT_OTHER): Payer: Self-pay

## 2016-08-18 ENCOUNTER — Emergency Department (HOSPITAL_BASED_OUTPATIENT_CLINIC_OR_DEPARTMENT_OTHER)
Admission: EM | Admit: 2016-08-18 | Discharge: 2016-08-18 | Disposition: A | Discharge status: Home / Self Care | Payer: Self-pay | Attending: Emergency Medicine | Admitting: Emergency Medicine

## 2016-08-18 DIAGNOSIS — R109 Unspecified abdominal pain: Secondary | ICD-10-CM

## 2016-08-18 DIAGNOSIS — R42 Dizziness and giddiness: Secondary | ICD-10-CM | POA: Insufficient documentation

## 2016-08-18 DIAGNOSIS — R0602 Shortness of breath: Secondary | ICD-10-CM | POA: Insufficient documentation

## 2016-08-18 DIAGNOSIS — R0781 Pleurodynia: Secondary | ICD-10-CM

## 2016-08-18 DIAGNOSIS — R110 Nausea: Secondary | ICD-10-CM

## 2016-08-18 DIAGNOSIS — R112 Nausea with vomiting, unspecified: Secondary | ICD-10-CM | POA: Insufficient documentation

## 2016-08-18 DIAGNOSIS — I10 Essential (primary) hypertension: Secondary | ICD-10-CM | POA: Insufficient documentation

## 2016-08-18 DIAGNOSIS — F17210 Nicotine dependence, cigarettes, uncomplicated: Secondary | ICD-10-CM | POA: Insufficient documentation

## 2016-08-18 LAB — CBC WITH DIFFERENTIAL/PLATELET
BASOS PCT: 1 %
Basophils Absolute: 0 10*3/uL (ref 0.0–0.1)
Eosinophils Absolute: 0.4 10*3/uL (ref 0.0–0.7)
Eosinophils Relative: 5 %
HEMATOCRIT: 43 % (ref 36.0–46.0)
HEMOGLOBIN: 15 g/dL (ref 12.0–15.0)
Lymphocytes Relative: 36 %
Lymphs Abs: 2.7 10*3/uL (ref 0.7–4.0)
MCH: 31.6 pg (ref 26.0–34.0)
MCHC: 34.9 g/dL (ref 30.0–36.0)
MCV: 90.7 fL (ref 78.0–100.0)
MONOS PCT: 6 %
Monocytes Absolute: 0.4 10*3/uL (ref 0.1–1.0)
NEUTROS ABS: 3.9 10*3/uL (ref 1.7–7.7)
NEUTROS PCT: 52 %
Platelets: 295 10*3/uL (ref 150–400)
RBC: 4.74 MIL/uL (ref 3.87–5.11)
RDW: 12 % (ref 11.5–15.5)
WBC: 7.4 10*3/uL (ref 4.0–10.5)

## 2016-08-18 LAB — URINALYSIS, ROUTINE W REFLEX MICROSCOPIC
Bilirubin Urine: NEGATIVE
Glucose, UA: NEGATIVE mg/dL
Hgb urine dipstick: NEGATIVE
Ketones, ur: NEGATIVE mg/dL
Leukocytes, UA: NEGATIVE
Nitrite: NEGATIVE
Protein, ur: NEGATIVE mg/dL
SPECIFIC GRAVITY, URINE: 1.021 (ref 1.005–1.030)
pH: 7.5 (ref 5.0–8.0)

## 2016-08-18 LAB — COMPREHENSIVE METABOLIC PANEL
ALBUMIN: 4 g/dL (ref 3.5–5.0)
ALK PHOS: 62 U/L (ref 38–126)
ALT: 16 U/L (ref 14–54)
ANION GAP: 8 (ref 5–15)
AST: 16 U/L (ref 15–41)
BUN: 11 mg/dL (ref 6–20)
CALCIUM: 9.5 mg/dL (ref 8.9–10.3)
CO2: 26 mmol/L (ref 22–32)
Chloride: 104 mmol/L (ref 101–111)
Creatinine, Ser: 0.82 mg/dL (ref 0.44–1.00)
GFR calc non Af Amer: >60 mL/min (ref >60)
GLUCOSE: 97 mg/dL (ref 65–99)
POTASSIUM: 3.8 mmol/L (ref 3.5–5.1)
Sodium: 138 mmol/L (ref 135–145)
TOTAL PROTEIN: 6.7 g/dL (ref 6.5–8.1)
Total Bilirubin: 0.5 mg/dL (ref 0.3–1.2)

## 2016-08-18 LAB — TROPONIN I: Troponin I: <0.03 ng/mL (ref <0.03)

## 2016-08-18 LAB — LIPASE, BLOOD: Lipase: 21 U/L (ref 11–51)

## 2016-08-18 MED ORDER — OMEPRAZOLE 20 MG PO CPDR: 20.0000 mg | DELAYED_RELEASE_CAPSULE | Freq: Every day | ORAL | 0 refills | Status: DC

## 2016-08-18 MED ORDER — IOPAMIDOL (ISOVUE-300) INJECTION 61%
100.0000 mL | Freq: Once | INTRAVENOUS | Status: AC | PRN
  Administered 2016-08-18: 100 mL via INTRAVENOUS

## 2016-08-18 MED ORDER — CYCLOBENZAPRINE HCL 5 MG PO TABS
5.0000 mg | ORAL_TABLET | Freq: Once | ORAL | Status: AC
  Administered 2016-08-18: 5 mg via ORAL
  Filled 2016-08-18: qty 1

## 2016-08-18 MED ORDER — SODIUM CHLORIDE 0.9 % IV BOLUS (SEPSIS)
1000.0000 mL | Freq: Once | INTRAVENOUS | Status: AC
  Administered 2016-08-18: 1000 mL via INTRAVENOUS

## 2016-08-18 MED ORDER — ONDANSETRON 4 MG PO TBDP: 4.0000 mg | ORAL_TABLET | Freq: Three times a day (TID) | ORAL | 0 refills | Status: DC | PRN

## 2016-08-18 MED ORDER — SUCRALFATE 1 G PO TABS
1.0000 g | ORAL_TABLET | Freq: Once | ORAL | Status: AC
  Administered 2016-08-18: 1 g via ORAL
  Filled 2016-08-18: qty 1

## 2016-08-18 MED ORDER — ONDANSETRON HCL 4 MG/2ML IJ SOLN
4.0000 mg | Freq: Once | INTRAMUSCULAR | Status: AC
  Administered 2016-08-18: 4 mg via INTRAVENOUS
  Filled 2016-08-18: qty 2

## 2016-08-18 MED ORDER — PANTOPRAZOLE SODIUM 40 MG PO TBEC
40.0000 mg | DELAYED_RELEASE_TABLET | Freq: Every day | ORAL | Status: DC
  Administered 2016-08-18: 40 mg via ORAL
  Filled 2016-08-18: qty 1

## 2016-08-18 MED ORDER — SUCRALFATE 1 G PO TABS: 1.0000 g | ORAL_TABLET | Freq: Two times a day (BID) | ORAL | 0 refills | Status: DC

## 2016-08-18 MED ORDER — MORPHINE SULFATE (PF) 2 MG/ML IV SOLN
2.0000 mg | Freq: Once | INTRAVENOUS | Status: AC
  Administered 2016-08-18: 2 mg via INTRAVENOUS
  Filled 2016-08-18: qty 1

## 2016-08-18 MED ORDER — HYDROCODONE-ACETAMINOPHEN 5-325 MG PO TABS: 1.0000 | ORAL_TABLET | Freq: Four times a day (QID) | ORAL | 0 refills | Status: DC | PRN

## 2016-08-18 MED ORDER — GI COCKTAIL ~~LOC~~: 30.0000 mL | Freq: Once | ORAL | Status: DC

## 2016-08-18 MED ORDER — HYDROCODONE-ACETAMINOPHEN 5-325 MG PO TABS
1.0000 | ORAL_TABLET | Freq: Once | ORAL | Status: AC
  Administered 2016-08-18: 1 via ORAL
  Filled 2016-08-18: qty 1

## 2016-08-18 MED FILL — HYDROCODON-APAP 5-325: 5-325 | 3 days supply | Qty: 12 | Fill #0

## 2016-08-18 MED FILL — OMEPRAZOLE DR 20 MG CAPSULE: 20 | 30 days supply | Qty: 30 | Fill #0

## 2016-08-18 MED FILL — SUCRALFATE 1 GM TABLET: 1 | 15 days supply | Qty: 30 | Fill #0

## 2016-08-18 NOTE — Discharge Instructions (Addendum)
Read the information below.   Your labs and imaging are re-assuring. Your symptoms improved with medicine. I encourage you to take omeprazole and carafate for relief of abdominal symptoms. I have also prescribed zofran for nausea relief. It is very important that you follow up with a stomach doctor (gastroenterology). I have provided the contact information above. Please call to schedule an appointment.  I have prescribed vicodin for rib pain relief. Take only for severe pain; otherwise, take tylenol for symptomatic relief. You can try applying an ice pack to the area for 20 minute increments.  Use the prescribed medication as directed.  Please discuss all new medications with your pharmacist.   You may return to the Emergency Department at any time for worsening condition or any new symptoms that concern you. Return to the ED if you develop fever, coughing up blood, chest pain, lower leg swelling/pain, worsening shortness of breath, vomiting blood, blood in stool, inability to keep food or fluid down, or localized abdominal pain.

## 2016-08-18 NOTE — ED Triage Notes (Signed)
Pt reports sitting up in the bathtub and feeling a pop in her L rib cage, pain is ongoing with nausea.

## 2016-08-18 NOTE — ED Notes (Signed)
Patient transported to CT 

## 2016-08-18 NOTE — ED Provider Notes (Signed)
[facility]-EM[facility]ENCY DEPT Provider Note   CSN: [id number] Arrival date & time: 08/18/16  1014     History   Chief Complaint Chief Complaint  Patient presents with  . Rib Injury    HPI Heather [name] is a 55 y.o. female.  Heather [name] is a 55 y.o. Female with h/o barrett's esophagus, HTN, anemia, SBO, chronic back pain presents to ED with complaint of abdominal pain and left rib pain. Patient reports she has had intermittent upper abdominal pain with nausea for approximately 3-4 weeks. Pain is worse after eating. She has associated abdominal distension and 3 episodes of vomiting. She denies dark, tarry stools or bright red blood. She denies dysuria, hematuria, vaginal discharge, or vaginal bleeding. She has a history of barretts esophagus, malrotation of colon, GI bleed secondary to ulcerations, pancreatic lesion, and SBO. She also endorses multiple abdominal surgeries. Patient is s/p appendectomy. Gallbladder still present.   Patient also endorses left rib pain. She has had left rib pain for a few weeks; however, she was getting out of the bathtub 5 days ago when she felt something "pop." She has had continued pain in her left anterior chest wall with associated shortness of breath. She also endorses an intermittently productive cough with white sputum. No lower leg swelling or pain. No recent long distance travel, surgery, or immobilization. No hormone therapy, h/o cancer, or h/o blood clots. No h/o cardiac conditions. She does have high blood pressure; however, she has not been taking her medication secondary to not having insurance.       Past Medical History:  Diagnosis Date  . Barrett's esophageal ulceration   . Chronic back pain   . GI bleed   . Hypertension   . Pancreatic lesion     Patient Active Problem List   Diagnosis Date Noted  . GI bleed 02/25/2012  . Black stool 02/25/2012  . Nausea & vomiting 02/25/2012  . Diarrhea 02/25/2012  . SBO (small bowel obstruction)  (HCC) 02/25/2012  . Barrett esophagus 02/25/2012  . Leukocytosis 02/25/2012  . Hyponatremia 02/25/2012  . Anemia 02/25/2012    Past Surgical History:  Procedure Laterality Date  . ABDOMINAL HYSTERECTOMY  2003   with Left Salpingo  . ABDOMINAL SURGERY     at age 13 for malrotation; appendectomy  . BACK SURGERY    . CESAREAN SECTION    . ESOPHAGOGASTRODUODENOSCOPY  02/26/2012   Procedure: ESOPHAGOGASTRODUODENOSCOPY (EGD);  Surgeon: Petra Kuba, MD;  Location: [facility];  Service: Endoscopy;  Laterality: N/A;  . LAPAROSCOPIC INCISIONAL / UMBILICAL / VENTRAL HERNIA REPAIR  2008  . LAPAROSCOPIC NISSEN FUNDOPLICATION  2000  . VENTRAL HERNIA REPAIR  2003    OB History    Gravida Para Term Preterm AB Living   6 2 2   4      SAB TAB Ectopic Multiple Live Births   4               Home Medications    Prior to Admission medications   Medication Sig Start Date End Date Taking? Authorizing Provider  acetaminophen (TYLENOL) 500 MG tablet Take 500-1,000 mg by mouth every 6 (six) hours as needed for mild pain or moderate pain.    Historical Provider, MD  HYDROcodone-acetaminophen (NORCO/VICODIN) 5-325 MG tablet Take 1 tablet by mouth every 6 (six) hours as needed for moderate pain or severe pain. 08/18/16   Lona Kettle, PA-C  omeprazole (PRILOSEC) 20 MG capsule Take 1 capsule (20 mg total) by mouth  daily. 08/18/16   Lona Kettle, PA-C  ondansetron (ZOFRAN ODT) 4 MG disintegrating tablet Take 1 tablet (4 mg total) by mouth every 8 (eight) hours as needed for nausea or vomiting. 08/18/16   Lona Kettle, PA-C  orphenadrine (NORFLEX) 100 MG tablet Take 1 tablet (100 mg total) by mouth 2 (two) times daily. 12/22/15   Arby Barrette, MD  predniSONE (DELTASONE) 20 MG tablet 3 tabs po daily x 3 days, then 2 tabs x 3 days, then 1.5 tabs x 3 days, then 1 tab x 3 days, then 0.5 tabs x 3 days 12/22/15   Arby Barrette, MD  sucralfate (CARAFATE) 1 g tablet Take 1 tablet (1 g total) by  mouth 2 (two) times daily. 08/18/16   Lona Kettle, PA-C  traMADol (ULTRAM) 50 MG tablet Take 1 tablet (50 mg total) by mouth every 6 (six) hours as needed. 08/08/15   Blane Ohara, MD    Family History Family History  Problem Relation Age of Onset  . Heart failure Mother   . Diabetes Mother   . Asthma Mother   . Heart failure Father   . Heart attack Father   . Heart failure Sister   . Diabetes Sister   . Kidney failure Sister   . Heart failure Brother     Social History Social History  Substance Use Topics  . Smoking status: Current Every Day Smoker    Packs/day: 0.50    Types: Cigarettes  . Smokeless tobacco: Never Used  . Alcohol use No     Allergies   Penicillins   Review of Systems Review of Systems  Constitutional: Negative for fever.  HENT: Positive for trouble swallowing ( lower esophagus).   Eyes: Negative for visual disturbance.  Respiratory: Positive for cough ( intermittent productive, white sputum) and shortness of breath ( intermittent). Negative for wheezing.   Cardiovascular: Negative for chest pain and leg swelling.  Gastrointestinal: Positive for abdominal distention ( intermittent), abdominal pain ( intermittent ), nausea and vomiting. Negative for blood in stool, constipation and diarrhea.  Genitourinary: Negative for dysuria, hematuria, vaginal bleeding and vaginal discharge.  Musculoskeletal: Negative for neck pain.  Skin: Negative for rash.  Neurological: Positive for light-headedness ( yesterday). Negative for dizziness, syncope, weakness and numbness.  Psychiatric/Behavioral: The patient is nervous/anxious.      Physical Exam Updated Vital Signs BP 127/88 (BP Location: Left Arm)   Pulse 110   Temp 98.5 F (36.9 C) (Oral)   Resp 18   Ht 5\' 5"  (1.651 m)   Wt 64 kg   SpO2 100%   BMI 23.46 kg/m   Physical Exam  Constitutional: She appears well-developed and well-nourished. No distress.  HENT:  Head: Normocephalic and  atraumatic.  Mouth/Throat: Uvula is midline, oropharynx is clear and moist and mucous membranes are normal. No trismus in the jaw. No oropharyngeal exudate.  Eyes: Conjunctivae and EOM are normal. Pupils are equal, round, and reactive to light. Right eye exhibits no discharge. Left eye exhibits no discharge. No scleral icterus.  Neck: Normal range of motion and phonation normal. Neck supple. No spinous process tenderness present. No neck rigidity. Normal range of motion present.  Cardiovascular: Regular rhythm, normal heart sounds and intact distal pulses.  Tachycardia present.   No murmur heard. Pulmonary/Chest: Effort normal and breath sounds normal. No stridor. No respiratory distress. She has no wheezes. She has no rales. She exhibits tenderness.    TTP of left anterior inferior chest wall  Abdominal: Soft.  Bowel sounds are normal. She exhibits no distension. There is tenderness in the right upper quadrant, epigastric area and periumbilical area. There is no rigidity, no rebound and no guarding.    Multiple well-healed abdominal scars.   Musculoskeletal: Normal range of motion.  No lower leg swelling. No TTP of posterior calf. No palpable cords.   Lymphadenopathy:    She has no cervical adenopathy.  Neurological: She is alert. Coordination normal.  Skin: Skin is warm and dry. She is not diaphoretic.  Psychiatric: She has a normal mood and affect. Her behavior is normal.     ED Treatments / Results  Labs (all labs ordered are listed, but only abnormal results are displayed) Labs Reviewed  COMPREHENSIVE METABOLIC PANEL  LIPASE, BLOOD  CBC WITH DIFFERENTIAL/PLATELET  TROPONIN I  URINALYSIS, ROUTINE W REFLEX MICROSCOPIC (NOT AT [facility])    EKG  EKG Interpretation  Date/Time:  Thursday August 18 2016 11:17:42 EDT Ventricular Rate:  84 PR Interval:    QRS Duration: 84 QT Interval:  365 QTC Calculation: 432 R Axis:   36 Text Interpretation:  Sinus rhythm No significant change  since last tracing Confirmed by YAO  MD, DAVID ([id number]) on 08/18/2016 11:30:37 AM       Radiology Dg Ribs Unilateral W/chest Left  Result Date: 08/18/2016 CLINICAL DATA:  Audible pop involving the left lower anterior and lateral rib cage when sitting up in the bathtub 4 days ago. Persistent pain. No injury. EXAM: LEFT RIBS AND CHEST - 3+ VIEW COMPARISON:  Left ribs with chest x-ray 04/06/2015, chest x-rays 03/29/2015 and earlier FINDINGS: The site of maximum pain and tenderness was marked with a metallic BB. No evidence of left rib fracture. No intrinsic osseous abnormalities involving the left ribs. Cardiomediastinal silhouette unremarkable and unchanged. Lungs clear. Interval resolution of the lingular opacity identified on the most recent prior examination in April, 2016. IMPRESSION: 1. No left rib fracture identified. 2.  No acute cardiopulmonary disease. Electronically Signed   By: Hulan Saas M.D.   On: 08/18/2016 12:24   Ct Abdomen Pelvis W Contrast  Result Date: 08/18/2016 CLINICAL DATA:  Epigastric and right upper quadrant pain, particularly after eating, some nausea EXAM: CT ABDOMEN AND PELVIS WITH CONTRAST TECHNIQUE: Multidetector CT imaging of the abdomen and pelvis was performed using the standard protocol following bolus administration of intravenous contrast. CONTRAST:  ISOVUE-300 IOPAMIDOL (ISOVUE-300) INJECTION 61% COMPARISON:  CT abdomen pelvis of 07/02/2015 FINDINGS: Mild dependent atelectasis is present at the posterior lung bases. No pneumonia or effusion is seen. The liver enhances with no focal abnormality and no ductal dilatation is seen. No calcified gallstones are noted. The common bile duct and pancreatic duct were noted to be somewhat prominent previously and this appearance is unchanged, of questionable significance. Correlation with lab values is recommended. No pancreatic lesion is seen. The adrenal glands and spleen are unremarkable. The stomach is not well  distended but no significant abnormality is evident. The kidneys enhance with no calculus or mass and on delayed images, the pelvocaliceal systems are unremarkable. The proximal ureters are normal in caliber. Mild abdominal aortic atherosclerosis is present. Again malrotation of the bowel is noted without obstruction. The entire colon it is within the left abdomen. The urinary bladder is moderately urine distended with no abnormality noted. The uterus has previously been resected. No adnexal lesion is seen. No fluid is noted within the pelvis. No adenopathy is seen. The lumbar vertebrae are in normal alignment. Posterior fusion is noted at L5-S1.  IMPRESSION: 1. No explanation for the patient's epigastric and right upper quadrant pain is seen. 2. Malrotation the bowel as described previously. No bowel obstruction. 3. Mild abdominal aortic atherosclerosis. Electronically Signed   By: Dwyane Dee M.D.   On: 08/18/2016 12:24    Procedures Procedures (including critical care time)  Medications Ordered in ED Medications  sodium chloride 0.9 % bolus 1,000 mL (0 mLs Intravenous Stopped 08/18/16 1424)  ondansetron (ZOFRAN) injection 4 mg (4 mg Intravenous Given 08/18/16 1133)  morphine 2 MG/ML injection 2 mg (2 mg Intravenous Given 08/18/16 1133)  iopamidol (ISOVUE-300) 61 % injection 100 mL (100 mLs Intravenous Contrast Given 08/18/16 1154)  cyclobenzaprine (FLEXERIL) tablet 5 mg (5 mg Oral Given 08/18/16 1324)  sucralfate (CARAFATE) tablet 1 g (1 g Oral Given 08/18/16 1324)  HYDROcodone-acetaminophen (NORCO/VICODIN) 5-325 MG per tablet 1 tablet (1 tablet Oral Given 08/18/16 1425)     Initial Impression / Assessment and Plan / ED Course  I have reviewed the triage vital signs and the nursing notes.  Pertinent labs & imaging results that were available during my care of the patient were reviewed by me and considered in my medical decision making (see chart for details).  Clinical Course  Value Comment By  Time  CT Abdomen Pelvis W Contrast Reviewed Lona Kettle, PA-C 08/31 1300  DG Ribs Unilateral W/Chest Left Normal cardiac silhouette. No evidence of consolidation, effusion, or PTX. No free air under diaphragm. No skeletal abnormalities appreciated.   Lona Kettle, PA-C 08/31 1300   On re-evaluation patient endorses improvement in abdominal pain and nausea as well as rib pain. Abdomen is soft and non-tender on re-examination.  Lona Kettle, [place] 08/31 1500    Patient presents to ED with complaint of rib pain and intermittent N/V and abdominal pain. Patient is afebrile and non-toxic appearing in NAD. Initial vital sign remarkable for tachycardia, otherwise stable. Respirations are unlabored, she is able to talk in complete sentences. O2 sats 100%. She has TTP of epigastric, RUQ, and periumbilical region. She has TTP of left anterior lower ribs at costal margin. Lungs are clear to auscultation. Will check EKG, CXR, and troponin regarding chest wall pain. Will check CBC, CMP, lipase, U/A for abdominal pain and N/V. Given h/o abdominal surgeries and malrotation will CT abdomen and pelvis to r/o pathology. IVF fluids, antiemetics, and pain medication given.   EKG shows NSR. Troponin negative. CXR negative for rib fracture or acute cardiopulmonary pathology - low suspicion for PNA, pleural effusion, or PTX. Heart score is 2. Low suspicion for cardiac etiology. While patient initially tachycardic, low suspicion for pulmonary embolus. O2 sats are 100%, respirations unlabored. No lower leg swelling/pain, no palpable cords. No evidence of right heart strain on EKG. Well's score is 1.5. Heart rate improved following pain control. Coough may be secondary to chronic tobacco use. Pain is reproducible to palpation - suspect MSK in nature vs. ?gastritis given N/V and TTP of abdomen.   CMP, CBC, lipase, and U/A re-assuring. Low suspicion for acute cholecystitis, cholangitis, pancreatitis, or  UTI/pyelonephritis. CT abdomen and pelvis shows no acute intra-abdominal or pelvis pathology, stable malrotation. Low suspicion for obstruction, perforation, or diverticulitis. Review of upper endoscopy records show patient has a history of peptic ulcers. Suspect symptoms may be secondary to ?GERD vs. ?PUD vs. ?gastritis. Patient has also been under increased stress secondary to personal losses and being a primary caregiver. Furthermore, patient has not been taking medicine for her GERD. These may be contributing  factors to current sxs. Sxs improved following carafate.   On re-evaluation, patient endorses improvement in rib pain and abdominal pain. Benign abdominal exam on re-evaluation. Discussed results and plan with patient. Rx carafate, omeprazole,  and zofran. Tylenol for rib pain. Rx vicodin for severe pain. Review of [place] Controlled Substance database shows last narcotic rx 03/08/16 for 5 tabs. Encouraged follow up with GI provider, contact information provided. Return precautions discussed. Patient voiced understanding and is agreeable.     Final Clinical Impressions(s) / ED Diagnoses   Final diagnoses:  Abdominal pain, unspecified abdominal location  Nausea  Rib pain on left side    New Prescriptions Discharge Medication List as of 08/18/2016  3:21 PM    START taking these medications   Details  omeprazole (PRILOSEC) 20 MG capsule Take 1 capsule (20 mg total) by mouth daily., Starting Thu 08/18/2016, Print    ondansetron (ZOFRAN ODT) 4 MG disintegrating tablet Take 1 tablet (4 mg total) by mouth every 8 (eight) hours as needed for nausea or vomiting., Starting Thu 08/18/2016, Print    sucralfate (CARAFATE) 1 g tablet Take 1 tablet (1 g total) by mouth 2 (two) times daily., Starting Thu 08/18/2016, Print         Lona KettleAshley Laurel Meyer, PA-C 08/20/16 0723    Lona KettleAshley Laurel Meyer, PA-C 08/20/16 [id number]    Charlynne Panderavid Hsienta Yao, MD 08/22/16 [id number]

## 2016-10-21 ENCOUNTER — Emergency Department (HOSPITAL_BASED_OUTPATIENT_CLINIC_OR_DEPARTMENT_OTHER): Payer: Self-pay

## 2016-10-21 ENCOUNTER — Encounter (HOSPITAL_BASED_OUTPATIENT_CLINIC_OR_DEPARTMENT_OTHER): Payer: Self-pay | Admitting: *Deleted

## 2016-10-21 ENCOUNTER — Emergency Department (HOSPITAL_BASED_OUTPATIENT_CLINIC_OR_DEPARTMENT_OTHER)
Admission: EM | Admit: 2016-10-21 | Discharge: 2016-10-21 | Disposition: A | Discharge status: Home / Self Care | Payer: Self-pay | Attending: Emergency Medicine | Admitting: Emergency Medicine

## 2016-10-21 DIAGNOSIS — I10 Essential (primary) hypertension: Secondary | ICD-10-CM | POA: Insufficient documentation

## 2016-10-21 DIAGNOSIS — F17210 Nicotine dependence, cigarettes, uncomplicated: Secondary | ICD-10-CM | POA: Insufficient documentation

## 2016-10-21 DIAGNOSIS — R05 Cough: Secondary | ICD-10-CM | POA: Insufficient documentation

## 2016-10-21 DIAGNOSIS — N644 Mastodynia: Secondary | ICD-10-CM

## 2016-10-21 DIAGNOSIS — R0602 Shortness of breath: Secondary | ICD-10-CM | POA: Insufficient documentation

## 2016-10-21 DIAGNOSIS — N6452 Nipple discharge: Secondary | ICD-10-CM

## 2016-10-21 DIAGNOSIS — Z79899 Other long term (current) drug therapy: Secondary | ICD-10-CM | POA: Insufficient documentation

## 2016-10-21 DIAGNOSIS — M542 Cervicalgia: Secondary | ICD-10-CM

## 2016-10-21 LAB — CBC WITH DIFFERENTIAL/PLATELET
BASOS PCT: 1 %
Basophils Absolute: 0.1 10*3/uL (ref 0.0–0.1)
EOS ABS: 0.4 10*3/uL (ref 0.0–0.7)
EOS PCT: 5 %
HCT: 42.6 % (ref 36.0–46.0)
Hemoglobin: 14.5 g/dL (ref 12.0–15.0)
Lymphocytes Relative: 36 %
Lymphs Abs: 3.1 10*3/uL (ref 0.7–4.0)
MCH: 31.4 pg (ref 26.0–34.0)
MCHC: 34 g/dL (ref 30.0–36.0)
MCV: 92.2 fL (ref 78.0–100.0)
MONO ABS: 0.6 10*3/uL (ref 0.1–1.0)
MONOS PCT: 6 %
NEUTROS PCT: 52 %
Neutro Abs: 4.5 10*3/uL (ref 1.7–7.7)
Platelets: 287 10*3/uL (ref 150–400)
RBC: 4.62 MIL/uL (ref 3.87–5.11)
RDW: 12.6 % (ref 11.5–15.5)
WBC: 8.7 10*3/uL (ref 4.0–10.5)

## 2016-10-21 LAB — I-STAT CG4 LACTIC ACID, ED: Lactic Acid, Venous: 0.65 mmol/L (ref 0.5–1.9)

## 2016-10-21 LAB — COMPREHENSIVE METABOLIC PANEL
ALBUMIN: 4.4 g/dL (ref 3.5–5.0)
ALT: 25 U/L (ref 14–54)
ANION GAP: 7 (ref 5–15)
AST: 21 U/L (ref 15–41)
Alkaline Phosphatase: 58 U/L (ref 38–126)
BUN: 16 mg/dL (ref 6–20)
CO2: 27 mmol/L (ref 22–32)
Calcium: 9.6 mg/dL (ref 8.9–10.3)
Chloride: 104 mmol/L (ref 101–111)
Creatinine, Ser: 0.72 mg/dL (ref 0.44–1.00)
GFR calc non Af Amer: >60 mL/min (ref >60)
GLUCOSE: 89 mg/dL (ref 65–99)
POTASSIUM: 3.5 mmol/L (ref 3.5–5.1)
SODIUM: 138 mmol/L (ref 135–145)
TOTAL PROTEIN: 7.1 g/dL (ref 6.5–8.1)
Total Bilirubin: 0.5 mg/dL (ref 0.3–1.2)

## 2016-10-21 LAB — TROPONIN I

## 2016-10-21 LAB — D-DIMER, QUANTITATIVE (NOT AT ARMC)

## 2016-10-21 MED ORDER — IOPAMIDOL (ISOVUE-300) INJECTION 61%
80.0000 mL | Freq: Once | INTRAVENOUS | Status: AC | PRN
  Administered 2016-10-21: 80 mL via INTRAVENOUS

## 2016-10-21 MED ORDER — MORPHINE SULFATE (PF) 4 MG/ML IV SOLN
4.0000 mg | Freq: Once | INTRAVENOUS | Status: AC
  Administered 2016-10-21: 4 mg via INTRAVENOUS
  Filled 2016-10-21: qty 1

## 2016-10-21 MED ORDER — SODIUM CHLORIDE 0.9 % IV BOLUS (SEPSIS)
1000.0000 mL | Freq: Once | INTRAVENOUS | Status: AC
  Administered 2016-10-21: 1000 mL via INTRAVENOUS

## 2016-10-21 MED ORDER — OXYCODONE-ACETAMINOPHEN 5-325 MG PO TABS: 1.0000 | ORAL_TABLET | Freq: Four times a day (QID) | ORAL | 0 refills | Status: DC | PRN

## 2016-10-21 NOTE — ED Triage Notes (Signed)
Pain in the right side of her neck x 2 weeks. She has been using OTC biofreeze. She has been told she has a disc problem in her neck. She has noticed enlarged nodes to her right axilla.

## 2016-10-21 NOTE — ED Provider Notes (Signed)
[facility] Provider Note   CSN: [id number] Arrival date & time: 10/21/16  1136     History   Chief Complaint Chief Complaint  Patient presents with  . Neck Pain    HPI Heather [name] is a 56 y.o. female With a past medical history significant for Chronic back/neck pain, prior abdominal surgeries, prior G.I. Bleed, and hypertension who presents with Neck pain with radiation down the right arm, and right breast pain. Patient reports that she has had chronic neck and back problems for years. She says that her pain is continued and is now causing her worsened symptoms. She describes the pain as going from her right neck all the way into her right hand to the tip of her middle finger. She denies numbness but does report the pain continues. She denies any change in hand coordination, grip strength, and she is right-handed. She reports that for the last two weeks, she has had worsening pain in her right breast. She says that she feels she is having a lump in her right armpit and pain going towards her nipple. She reports that she has seen discharge from the right nipple which is new. She denies a history of breast cancer. She reports a chronic cough with brown sputum. She denies fevers, chills, other chest pain, constipation, diarrhea, dysuria, abdominal pain, or diaphoresis. She does report some shortness of breath with her cough. She does smoke.     The history is provided by the patient and medical records. No language interpreter was used.  Chest Pain   This is a new problem. The current episode started more than 1 week ago. The problem occurs constantly. The problem has been gradually worsening. The pain is associated with raising an arm. The pain is present in the lateral region (pain in right axle and breast. No significant chest pain.). The pain is at a severity of 8/10. The pain is moderate. The quality of the pain is described as sharp. Radiates to: right breast into right  axilla. Duration of episode(s) is 2 weeks. The symptoms are aggravated by certain positions. Associated symptoms include back pain, cough, shortness of breath and sputum production. Pertinent negatives include no abdominal pain, no diaphoresis, no dizziness, no exertional chest pressure, no fever, no headaches, no irregular heartbeat, no lower extremity edema, no malaise/fatigue, no nausea, no numbness, no palpitations, no syncope, no vomiting and no weakness. She has tried nothing for the symptoms. The treatment provided no relief. Risk factors include smoking/tobacco exposure.    Past Medical History:  Diagnosis Date  . Barrett's esophageal ulceration   . Chronic back pain   . GI bleed   . Hypertension   . Pancreatic lesion     Patient Active Problem List   Diagnosis Date Noted  . GI bleed 02/25/2012  . Black stool 02/25/2012  . Nausea & vomiting 02/25/2012  . Diarrhea 02/25/2012  . SBO (small bowel obstruction) 02/25/2012  . Barrett esophagus 02/25/2012  . Leukocytosis 02/25/2012  . Hyponatremia 02/25/2012  . Anemia 02/25/2012    Past Surgical History:  Procedure Laterality Date  . ABDOMINAL HYSTERECTOMY  2003   with Left Salpingo  . 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 BACK SURGERY    . CESAREAN SECTION    . ESOPHAGOGASTRODUODENOSCOPY  02/26/2012   Procedure: ESOPHAGOGASTRODUODENOSCOPY (EGD);  Surgeon: Petra Kuba, MD;  Location: [facility];  Service: Endoscopy;  Laterality: N/A;  . LAPAROSCOPIC INCISIONAL /  UMBILICAL / VENTRAL HERNIA REPAIR  2008  . LAPAROSCOPIC NISSEN FUNDOPLICATION  2000  . VENTRAL HERNIA REPAIR  2003    OB History    Gravida Para Term Preterm AB Living   6 2 2   4      SAB TAB Ectopic Multiple Live Births   4               Home Medications    Prior to Admission medications   Medication Sig Start Date End Date Taking? Authorizing Provider  acetaminophen (TYLENOL) 500 MG tablet Take 500-1,000 mg by mouth every 6  (six) hours as needed for mild pain or moderate pain.   Yes Historical Provider, MD  omeprazole (PRILOSEC) 20 MG capsule Take 1 capsule (20 mg total) by mouth daily. 08/18/16  Yes Lona Kettle, PA-C  sucralfate (CARAFATE) 1 g tablet Take 1 tablet (1 g total) by mouth 2 (two) times daily. 08/18/16  Yes Lona Kettle, PA-C  HYDROcodone-acetaminophen (NORCO/VICODIN) 5-325 MG tablet Take 1 tablet by mouth every 6 (six) hours as needed for moderate pain or severe pain. 08/18/16   Lona Kettle, PA-C  ondansetron (ZOFRAN ODT) 4 MG disintegrating tablet Take 1 tablet (4 mg total) by mouth every 8 (eight) hours as needed for nausea or vomiting. 08/18/16   Lona Kettle, PA-C  orphenadrine (NORFLEX) 100 MG tablet Take 1 tablet (100 mg total) by mouth 2 (two) times daily. 12/22/15   Arby Barrette, MD  predniSONE (DELTASONE) 20 MG tablet 3 tabs po daily x 3 days, then 2 tabs x 3 days, then 1.5 tabs x 3 days, then 1 tab x 3 days, then 0.5 tabs x 3 days 12/22/15   Arby Barrette, MD  traMADol (ULTRAM) 50 MG tablet Take 1 tablet (50 mg total) by mouth every 6 (six) hours as needed. 08/08/15   Blane Ohara, MD    Family History Family History  Problem Relation Age of Onset  . Heart failure Mother   . Diabetes Mother   . Asthma Mother   . Heart failure Father   . Heart attack Father   . Heart failure Sister   . Diabetes Sister   . Kidney failure Sister   . Heart failure Brother     Social History Social History  Substance Use Topics  . Smoking status: Current Every Day Smoker    Packs/day: 0.50    Types: Cigarettes  . Smokeless tobacco: Never Used  . Alcohol use No     Allergies   Penicillins   Review of Systems Review of Systems  Constitutional: Negative for activity change, chills, diaphoresis, fatigue, fever and malaise/fatigue.  HENT: Negative for congestion and rhinorrhea.   Eyes: Negative for visual disturbance.  Respiratory: Positive for cough, sputum production  and shortness of breath. Negative for chest tightness, wheezing and stridor.   Cardiovascular: Negative for chest pain, palpitations, leg swelling and syncope.       Right breast pain   Gastrointestinal: Negative for abdominal distention, abdominal pain, constipation, diarrhea, nausea and vomiting.  Genitourinary: Negative for difficulty urinating, dysuria, flank pain, frequency, hematuria, menstrual problem, pelvic pain, urgency, vaginal bleeding and vaginal discharge.  Musculoskeletal: Positive for back pain and neck pain. Negative for neck stiffness.  Skin: Negative for rash and wound.  Neurological: Negative for dizziness, weakness, light-headedness, numbness and headaches.  Hematological: Negative for adenopathy.  Psychiatric/Behavioral: Negative for agitation and confusion.  All other systems reviewed and are negative.    Physical Exam Updated  Vital Signs BP 145/92   Pulse 96   Temp 97.8 F (36.6 C) (Oral)   Resp 20   Ht 5' 5.5" (1.664 m)   Wt 141 lb (64 kg)   SpO2 97%   BMI 23.11 kg/m   Physical Exam  Constitutional: She is oriented to person, place, and time. She appears well-developed and well-nourished. No distress.  HENT:  Head: Normocephalic and atraumatic.  Mouth/Throat: Oropharynx is clear and moist. No oropharyngeal exudate.  Eyes: Conjunctivae and EOM are normal. Pupils are equal, round, and reactive to light.  Neck: Normal range of motion and full passive range of motion without pain. Neck supple. No JVD present. Muscular tenderness present. No spinous process tenderness present. No neck rigidity. No tracheal deviation, no erythema and normal range of motion present.    Cardiovascular: Normal rate, regular rhythm, normal heart sounds and intact distal pulses.   No murmur heard. Pulmonary/Chest: Effort normal and breath sounds normal. No stridor. No respiratory distress. She has no wheezes. She has no rales. She exhibits tenderness. She exhibits no laceration  and no crepitus. Right breast exhibits nipple discharge and tenderness. Right breast exhibits no inverted nipple and no mass.    Tenderness from right axilla towards the right nipple. No large mass palpated. Patient had discharged on right nipple. No crepitance, fluctuance, or erythema appreciated.  Abdominal: Soft. There is no tenderness.  Genitourinary: There is breast tenderness and discharge. No breast bleeding.  Musculoskeletal: She exhibits tenderness. She exhibits no edema.  Lymphadenopathy:    She has no cervical adenopathy.  Neurological: She is alert and oriented to person, place, and time. She has normal reflexes. She is not disoriented. She displays no tremor and normal reflexes. No cranial nerve deficit or sensory deficit. She exhibits normal muscle tone. Coordination and gait normal. GCS eye subscore is 4. GCS verbal subscore is 5. GCS motor subscore is 6.  Normal strength, sensation, grip strength, coordination, reflexes in both upper extremities. Normal pulses.  Skin: Skin is warm and dry. Capillary refill takes less than 2 seconds. No rash noted. No erythema.  Psychiatric: She has a normal mood and affect.  Nursing note and vitals reviewed.    ED Treatments / Results  Labs (all labs ordered are listed, but only abnormal results are displayed) Labs Reviewed  CBC WITH DIFFERENTIAL/PLATELET  COMPREHENSIVE METABOLIC PANEL  D-DIMER, QUANTITATIVE (NOT AT [facility])  TROPONIN I  I-STAT CG4 LACTIC ACID, ED    EKG  EKG Interpretation None       Radiology Dg Chest 2 View  Result Date: 10/21/2016 CLINICAL DATA:  RIGHT lateral neck pain for 2 weeks radiating next radiograph Set to RIGHT axilla and breast. Cough and shortness of breath. EXAM: CHEST  2 VIEW COMPARISON:  Chest radiograph August 18, 2006 FINDINGS: Cardiomediastinal silhouette is normal. No pleural effusions or focal consolidations. Bibasilar strandy densities. Trachea projects midline and there is no pneumothorax.  Soft tissue planes and included osseous structures are non-suspicious. Upper lumbar dextroscoliosis. Abdominal herniorrhaphy. Surgical clips project upper abdomen. IMPRESSION: Bibasilar atelectasis. Electronically Signed   By: Awilda Metroourtnay  Bloomer M.D.   On: 10/21/2016 13:32   Ct Chest W Contrast  Result Date: 10/21/2016 CLINICAL DATA:  Right lateral neck pain radiating to the right axilla and breast region over the last 2 weeks. Nodal enlargement and swelling of the right breast with drainage. EXAM: CT CHEST WITH CONTRAST TECHNIQUE: Multidetector CT imaging of the chest was performed during intravenous contrast administration. CONTRAST:  80mL ISOVUE-300 IOPAMIDOL (  ISOVUE-300) INJECTION 61% COMPARISON:  Radiography same day.  Chest CT 03/19/2013. FINDINGS: Cardiovascular: Heart size is normal. No pericardial fluid. There is aortic atherosclerotic calcification. There is coronary artery calcification. No pulmonary arterial pathology is seen. Mediastinum/Nodes: No enlarged hilar or mediastinal lymph nodes. No evidence of axillary lymphadenopathy. Lungs/Pleura: Mild linear scarring of both lung bases. No evidence of active infiltrate, mass, effusion or collapse. Upper Abdomen: Negative Musculoskeletal: No spinal disease identified. No chest wall disease identified. Specific attention to the right breast region does not show any abnormality definable by CT. CT is not accurate in evaluating breast disease. IMPRESSION: Negative CT scan of the chest. No cause of the presenting symptoms is identified. I do not see any evidence of mass, adenopathy or discernible inflammatory change affecting the right chest. Note that CT is not an accurate method for evaluating breast disease. Aortic atherosclerosis.  Coronary artery atherosclerosis. Electronically Signed   By: Paulina Fusi M.D.   On: 10/21/2016 14:37   Ct Cervical Spine Wo Contrast  Result Date: 10/21/2016 CLINICAL DATA:  55 year old female with right lateral neck pain  radiating to the axilla and breast for 2 weeks. Right nipple drainage, breast swelling and node enlargement. Initial encounter. EXAM: CT CERVICAL SPINE WITHOUT CONTRAST TECHNIQUE: Multidetector CT imaging of the cervical spine was performed without intravenous contrast. Multiplanar CT image reconstructions were also generated. COMPARISON:  Cervical spine CT 11/30/2010. Chest CT from today reported separately. FINDINGS: Alignment: Improved cervical lordosis compared to 2011. Residual mild straightening. Cervicothoracic junction alignment is within normal limits. Bilateral posterior element alignment is within normal limits. Skull base and vertebrae: Visualized skull base is intact. No atlanto-occipital dissociation. Visualized paranasal sinuses and mastoids are stable and well pneumatized. Negative visualized posterior fossa. No cervical spine fracture. 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 Motion artifact at the oropharynx. Otherwise negative noncontrast neck soft tissues. Disc levels: C2-C3: Small central disc protrusion. Mild left facet hypertrophy. No significant stenosis. C3-C4: Circumferential disc bulge. Moderate to severe left facet hypertrophy. Left greater than right uncovertebral hypertrophy. Broad-based posterior component of disc. Up to mild spinal stenosis and severe left C4 foraminal stenosis. C4-C5: Mild disc bulge. Mild left greater than right facet hypertrophy. No stenosis. C5-C6: Mild left greater than right facet hypertrophy. Mild uncovertebral hypertrophy. Minimal disc bulge. Up to mild left C6 foraminal stenosis. C6-C7: Circumferential disc bulge with broad-based posterior component. Mild facet and left greater than right uncovertebral hypertrophy. Borderline to mild spinal stenosis. Moderate left and mild to moderate right C7  foraminal stenosis. C7-T1: Uncovertebral hypertrophy greater on the left. No spinal or definite right foraminal stenosis. Moderate left C8 foraminal stenosis. Upper chest: Negative lung apices. Visualized upper thoracic levels appear intact. Chest CT today is reported separately. IMPRESSION: 1.  No acute osseous abnormality in the cervical spine. 2. Multilevel cervical disc and mostly left side facet and endplate degeneration. Up to mild degenerative spinal stenosis at C3-C4 and C6-C7. Severe left C4 neural foraminal stenosis. Moderate left greater than right C7 and C8 neural foraminal stenosis. 3. Chest CT today reported separately. Electronically Signed   By: Odessa Fleming M.D.   On: 10/21/2016 14:41    Procedures Procedures (including critical care time)  Medications Ordered in ED Medications  morphine 4 MG/ML injection 4 mg (4 mg Intravenous Given 10/21/16 1308)  sodium chloride 0.9 % bolus  1,000 mL (0 mLs Intravenous Stopped 10/21/16 1440)  iopamidol (ISOVUE-300) 61 % injection 80 mL (80 mLs Intravenous Contrast Given 10/21/16 1413)  morphine 4 MG/ML injection 4 mg (4 mg Intravenous Given 10/21/16 1439)     Initial Impression / Assessment and Plan / ED Course  I have reviewed the triage vital signs and the nursing notes.  Pertinent labs & imaging results that were available during my care of the patient were reviewed by me and considered in my medical decision making (see chart for details).  Clinical Course    Arnette SchaumannDonna K [name] is a 55 y.o. female With a past medical history significant for Chronic back/neck pain, prior abdominal surgeries, prior G.I. Bleed, and hypertension who presents with Neck pain with radiation down the right arm, and right breast pain.  History and exam are seen above.  Exam significant for tenderness on the right neck causing radicular type pain shooting down her right arm into her hand. Neuro- exam otherwise unremarkable. Breast exam was performed with a chaperone.  Patient had tenderness in the right axle and right breast but no lumps were appreciated. No fluctuance, crepitance, erythema, or abscess was seen. Nipple discharge was visible. Nonbloody. Abdomen nontender and exam otherwise unremarkable.  Given shortness of breath And productive cough, pneumonia considered. For shortness of breath in patient over 50, pulmonary wasn't considered. The direct order. Chest x-ray order to evaluate for pneumonia. Given patients breast tenderness, discharge, and axolotl tenderness, CT scan order to look for evidence of abscess. Malignancy considered however, CT scan is not the best modality to evaluate. Other laboratory testing order to look for occult infection or Electrolyte abnormality. CT scan order to look for fracture or malalignment causing radicular pain worsening.  Workup results are seen above. Lactic acid normal comedy dimer negative, troponin negative, CBC and CMP unremarkable.  X-ray showed evidence of atelectasis but no pneumonia. CT scan showed No evidence of mass orEvidence of infection. Cervical spine imaging showed degenerative disease with no apparent fractures Or osseous abnormalities. Patient informed of stenosis findings.  Patient felt better after pain medications. Given grossly negative workup, feel patient is appropriate for discharge outpatient workup of breast symptoms. Patient will follow up with PCP for further breast imaging. Suspect ultrasound or mammogram is needed to rule out malignancy. Patient also referred to her PCP for further chronic neck pain management. Given lack of numbness, or weakness, do not feel she requires MRI at this time.  Patient given prescriptive for pain medicine and strict return precautions. Patient had no other questions or concerns and was discharged in good condition.   Final Clinical Impressions(s) / ED Diagnoses   Final diagnoses:  Neck pain  Breast pain, right  Discharge of breast    New  Prescriptions Discharge Medication List as of 10/21/2016  3:51 PM    START taking these medications   Details  oxyCODONE-acetaminophen (PERCOCET/ROXICET) 5-325 MG tablet Take 1 tablet by mouth every 6 (six) hours as needed for severe pain., Starting Fri 10/21/2016, Print        Clinical Impression: 1. Neck pain   2. Breast pain, right   3. Discharge of breast     Disposition: Discharge  Condition: Good  I have discussed the results, Dx and Tx plan with the pt(& family if present). He/she/they expressed understanding and agree(s) with the plan. Discharge instructions discussed at great length. Strict return precautions discussed and pt &/or family have verbalized understanding of the instructions. No further questions at time of  discharge.    Discharge Medication List as of 10/21/2016  3:51 PM    START taking these medications   Details  oxyCODONE-acetaminophen (PERCOCET/ROXICET) 5-325 MG tablet Take 1 tablet by mouth every 6 (six) hours as needed for severe pain., Starting Fri 10/21/2016, Print        Follow Up: [facility] [address] [place] [id number] [phone]       Heide Scales, MD 10/21/16 2014

## 2016-10-21 NOTE — Discharge Instructions (Signed)
Please follow-up with a primary physician for further workup of your breast pain and discharge. If symptoms worsen, please return to the nearest ED.

## 2016-11-07 ENCOUNTER — Encounter (HOSPITAL_BASED_OUTPATIENT_CLINIC_OR_DEPARTMENT_OTHER): Payer: Self-pay | Admitting: *Deleted

## 2016-11-07 ENCOUNTER — Emergency Department (HOSPITAL_BASED_OUTPATIENT_CLINIC_OR_DEPARTMENT_OTHER)
Admission: EM | Admit: 2016-11-07 | Discharge: 2016-11-07 | Disposition: A | Discharge status: Home / Self Care | Payer: Self-pay | Attending: Emergency Medicine | Admitting: Emergency Medicine

## 2016-11-07 DIAGNOSIS — I10 Essential (primary) hypertension: Secondary | ICD-10-CM | POA: Insufficient documentation

## 2016-11-07 DIAGNOSIS — F17210 Nicotine dependence, cigarettes, uncomplicated: Secondary | ICD-10-CM | POA: Insufficient documentation

## 2016-11-07 DIAGNOSIS — M5412 Radiculopathy, cervical region: Secondary | ICD-10-CM | POA: Insufficient documentation

## 2016-11-07 MED ORDER — PREDNISONE 50 MG PO TABS
60.0000 mg | ORAL_TABLET | Freq: Once | ORAL | Status: AC
  Administered 2016-11-07: 60 mg via ORAL
  Filled 2016-11-07: qty 1

## 2016-11-07 MED ORDER — OXYCODONE-ACETAMINOPHEN 5-325 MG PO TABS
1.0000 | ORAL_TABLET | Freq: Once | ORAL | Status: AC
  Administered 2016-11-07: 1 via ORAL
  Filled 2016-11-07: qty 1

## 2016-11-07 MED ORDER — HYDROMORPHONE HCL 1 MG/ML IJ SOLN
1.0000 mg | Freq: Once | INTRAMUSCULAR | Status: AC
  Administered 2016-11-07: 1 mg via INTRAMUSCULAR
  Filled 2016-11-07: qty 1

## 2016-11-07 MED ORDER — PREDNISONE 20 MG PO TABS: ORAL_TABLET | ORAL | 0 refills | Status: DC

## 2016-11-07 MED ORDER — OXYCODONE-ACETAMINOPHEN 5-325 MG PO TABS: 1.0000 | ORAL_TABLET | ORAL | 0 refills | Status: DC | PRN

## 2016-11-07 MED ORDER — ORPHENADRINE CITRATE ER 100 MG PO TB12: 100.0000 mg | ORAL_TABLET | Freq: Two times a day (BID) | ORAL | 0 refills | Status: DC | PRN

## 2016-11-07 MED FILL — OXYCODONE/APAP 5/325 MG TAB: 5-325 | 2 days supply | Qty: 20 | Fill #0

## 2016-11-07 NOTE — ED Notes (Signed)
Pt states she has filled out her paperwork to get in with a primary doctor but has not had any response. Pain persists.

## 2016-11-07 NOTE — ED Notes (Signed)
ED Provider at bedside. 

## 2016-11-07 NOTE — ED Triage Notes (Signed)
Neck pain. States she has a hx of disc problem.

## 2016-11-07 NOTE — ED Provider Notes (Signed)
[facility]   CSN: [id number] Arrival date & time: 11/07/16  1054     History   Chief Complaint Chief Complaint  Patient presents with  . Neck Pain    HPI Heather [name] is a 55 y.o. female.  HPI   Pt with known spinal stenosis, cervical radiculopathy p/w continued pain in her right neck and arm.  Was seen in ED 10/21/16 for same.  Pt has run out of percocet and reports the pain is constant and progressively worsening.  Pain is worst first thing in the morning, is slightly improved with topical rub and tylenol.  Has intermittent numbness in the arm.   Has been in contact with her neurosurgeon Dr Lindalou HosePool's office to discuss setting up an appointment with a payment plan.  She does not have a primary care provider, is awaiting a call about financial assistance as she does not have insurance.    Pt was seen in ED 10/21/16 with similar symptoms.  CT cervical spine demonstrated multil;evel disc degeneration and spinal stenosis with severe stenosis at C4.   CT chest  Negative.     Past Medical History:  Diagnosis Date  . Barrett's esophageal ulceration   . Chronic back pain   . GI bleed   . Hypertension   . Pancreatic lesion     Patient Active Problem List   Diagnosis Date Noted  . GI bleed 02/25/2012  . Black stool 02/25/2012  . Nausea & vomiting 02/25/2012  . Diarrhea 02/25/2012  . SBO (small bowel obstruction) 02/25/2012  . Barrett esophagus 02/25/2012  . Leukocytosis 02/25/2012  . Hyponatremia 02/25/2012  . Anemia 02/25/2012    Past Surgical History:  Procedure Laterality Date  . ABDOMINAL HYSTERECTOMY  2003   with Left Salpingo  . 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 BACK SURGERY    . CESAREAN SECTION    . ESOPHAGOGASTRODUODENOSCOPY  02/26/2012   Procedure: ESOPHAGOGASTRODUODENOSCOPY (EGD);  Surgeon: Petra KubaMarc E Magod, MD;  Location: [facility];  Service: Endoscopy;  Laterality: N/A;  . LAPAROSCOPIC INCISIONAL /  UMBILICAL / VENTRAL HERNIA REPAIR  2008  . LAPAROSCOPIC NISSEN FUNDOPLICATION  2000  . VENTRAL HERNIA REPAIR  2003    OB History    Gravida Para Term Preterm AB Living   6 2 2   4      SAB TAB Ectopic Multiple Live Births   4               Home Medications    Prior to Admission medications   Medication Sig Start Date End Date Taking? Authorizing Provider  acetaminophen (TYLENOL) 500 MG tablet Take 500-1,000 mg by mouth every 6 (six) hours as needed for mild pain or moderate pain.    Historical Provider, MD  HYDROcodone-acetaminophen (NORCO/VICODIN) 5-325 MG tablet Take 1 tablet by mouth every 6 (six) hours as needed for moderate pain or severe pain. 08/18/16   Lona KettleAshley Laurel Meyer, PA-C  omeprazole (PRILOSEC) 20 MG capsule Take 1 capsule (20 mg total) by mouth daily. 08/18/16   Lona KettleAshley Laurel Meyer, PA-C  ondansetron (ZOFRAN ODT) 4 MG disintegrating tablet Take 1 tablet (4 mg total) by mouth every 8 (eight) hours as needed for nausea or vomiting. 08/18/16   Lona KettleAshley Laurel Meyer, PA-C  orphenadrine (NORFLEX) 100 MG tablet Take 1 tablet (100 mg total) by mouth 2 (two) times daily as needed for muscle spasms (and pain). 11/07/16   Trixie DredgeEmily Hoang Pettingill, PA-C  oxyCODONE-acetaminophen (PERCOCET/ROXICET) 5-325 MG tablet Take 1-2 tablets by mouth every 4 (four) hours as needed for moderate pain or severe pain. 11/07/16   Trixie DredgeEmily Hassen Bruun, PA-C  predniSONE (DELTASONE) 20 MG tablet 3 tabs po daily x 3 days, then 2 tabs x 3 days, then 1.5 tabs x 3 days, then 1 tab x 3 days, then 0.5 tabs x 3 days 11/07/16   Trixie DredgeEmily Ha Placeres, PA-C  sucralfate (CARAFATE) 1 g tablet Take 1 tablet (1 g total) by mouth 2 (two) times daily. 08/18/16   Lona KettleAshley Laurel Meyer, PA-C  traMADol (ULTRAM) 50 MG tablet Take 1 tablet (50 mg total) by mouth every 6 (six) hours as needed. 08/08/15   Blane OharaJoshua Zavitz, MD    Family History Family History  Problem Relation Age of Onset  . Heart failure Mother   . Diabetes Mother   . Asthma Mother   . Heart failure  Father   . Heart attack Father   . Heart failure Sister   . Diabetes Sister   . Kidney failure Sister   . Heart failure Brother     Social History Social History  Substance Use Topics  . Smoking status: Current Every Day Smoker    Packs/day: 0.50    Types: Cigarettes  . Smokeless tobacco: Never Used  . Alcohol use No     Allergies   Penicillins   Review of Systems Review of Systems  Constitutional: Negative for chills and fever.  Respiratory: Negative for cough and shortness of breath.   Cardiovascular: Positive for chest pain.  Musculoskeletal: Positive for neck pain. Negative for joint swelling.  Skin: Negative for color change and wound.  Allergic/Immunologic: Negative for immunocompromised state.  Neurological: Positive for numbness. Negative for weakness.  Psychiatric/Behavioral: Negative for self-injury.     Physical Exam Updated Vital Signs BP 144/90   Pulse 110   Temp 97.9 F (36.6 C) (Oral)   Resp 20   Ht 5' 5.5" (1.664 m)   Wt 64 kg   SpO2 100%   BMI 23.11 kg/m   Physical Exam  Constitutional: She appears well-developed and well-nourished.  HENT:  Head: Normocephalic and atraumatic.  Neck: Neck supple.  Pulmonary/Chest: Effort normal.  Musculoskeletal:  Cervical spine and right shoulder, arm sensitive to touch.  Sensation intact.  Pt does not engage in strength testing as it increases pain.  She raises arm above head, uses it to push herself around the stretcher, and lifts it independently without assistance from left arm.  No edema or skin changes noted.  Distal pulses intact.    Neurological: She is alert.  Nursing note and vitals reviewed.    ED Treatments / Results  Labs (all labs ordered are listed, but only abnormal results are displayed) Labs Reviewed - No data to display  EKG  EKG Interpretation None       Radiology No results found.  Procedures Procedures (including critical care time)  Medications Ordered in  ED Medications  oxyCODONE-acetaminophen (PERCOCET/ROXICET) 5-325 MG per tablet 1 tablet (1 tablet Oral Given 11/07/16 1210)  predniSONE (DELTASONE) tablet 60 mg (60 mg Oral Given 11/07/16 1210)  HYDROmorphone (DILAUDID) injection 1 mg (1 mg Intramuscular Given 11/07/16 1243)  oxyCODONE-acetaminophen (PERCOCET/ROXICET) 5-325 MG per tablet 1 tablet (1 tablet Oral Given 11/07/16 1347)     Initial Impression / Assessment and Plan / ED Course  I have reviewed the triage vital signs and the nursing notes.  Pertinent labs & imaging results that were available during my care of  the patient were reviewed by me and considered in my medical decision making (see chart for details).  Clinical Course     Afebrile nontoxic patient with known cervical radiculopathy with recent CT cervical spine showing spinal stenosis presents with gradually worsening symptoms.  Pt has constant pain and no current pain medications at home, notes norflex has worked well for her in the past.  No recent steroids.  Pt does have chronic concerns about right axillary soreness and right nipple discharge (x years) and is aware she needs a mammogram - is working towards this by getting PCP.  Pain improved with medication in the ED and repeat strength testing demonstrates no weakness on the right.  Doubt need for emergent MRI.  Pt has had thorough workup of same symptoms 2 weeks ago, gradual worsening but no distinct change.  Pt currently only taking tylenol for symptoms at home.  D/C home with prednisone, norflex, percocet, PCP/orthopedic/neurosurgery follow up.  Pt given multiple resources and referral to sports medicine.  Discussed result, findings, treatment, and follow up  with patient.  Pt given return precautions.  Pt verbalizes understanding and agrees with plan.      Final Clinical Impressions(s) / ED Diagnoses   Final diagnoses:  Cervical radiculopathy    New Prescriptions Discharge Medication List as of 11/07/2016  1:44  PM       Trixie Dredge, PA-C 11/07/16 1656    Charlynne Pander, MD 11/08/16 [phone]

## 2016-11-07 NOTE — Discharge Instructions (Signed)
Read the information below.  Use the prescribed medication as directed.  Please discuss all new medications with your pharmacist.  Do not take additional tylenol while taking the prescribed pain medication to avoid overdose.  You may return to the Emergency Department at any time for worsening condition or any new symptoms that concern you.    If you develop uncontrolled pain, weakness or numbness of the extremity, severe discoloration of the skin, or you are unable to move your arm, return to the ER for a recheck.    °

## 2016-11-17 ENCOUNTER — Emergency Department (HOSPITAL_BASED_OUTPATIENT_CLINIC_OR_DEPARTMENT_OTHER)
Admission: EM | Admit: 2016-11-17 | Discharge: 2016-11-17 | Disposition: A | Discharge status: Home / Self Care | Payer: Self-pay | Attending: Physician Assistant | Admitting: Physician Assistant

## 2016-11-17 ENCOUNTER — Encounter (HOSPITAL_BASED_OUTPATIENT_CLINIC_OR_DEPARTMENT_OTHER): Payer: Self-pay | Admitting: *Deleted

## 2016-11-17 DIAGNOSIS — I10 Essential (primary) hypertension: Secondary | ICD-10-CM | POA: Insufficient documentation

## 2016-11-17 DIAGNOSIS — M5412 Radiculopathy, cervical region: Secondary | ICD-10-CM | POA: Insufficient documentation

## 2016-11-17 DIAGNOSIS — Z79899 Other long term (current) drug therapy: Secondary | ICD-10-CM | POA: Insufficient documentation

## 2016-11-17 DIAGNOSIS — F17210 Nicotine dependence, cigarettes, uncomplicated: Secondary | ICD-10-CM | POA: Insufficient documentation

## 2016-11-17 MED ORDER — OXYCODONE-ACETAMINOPHEN 5-325 MG PO TABS: 1.0000 | ORAL_TABLET | Freq: Four times a day (QID) | ORAL | 0 refills | Status: DC | PRN

## 2016-11-17 MED ORDER — TRAMADOL HCL 50 MG PO TABS: 50.0000 mg | ORAL_TABLET | Freq: Four times a day (QID) | ORAL | 0 refills | Status: DC | PRN

## 2016-11-17 MED ORDER — CYCLOBENZAPRINE HCL 10 MG PO TABS: 10.0000 mg | ORAL_TABLET | Freq: Two times a day (BID) | ORAL | 0 refills | Status: DC | PRN

## 2016-11-17 MED FILL — traMADol HCL 50 MG TABS: 50 | 4 days supply | Qty: 15 | Fill #0

## 2016-11-17 MED FILL — OXYCODONE/APAP 5-325: 5-325 | 3 days supply | Qty: 11 | Fill #0

## 2016-11-17 MED FILL — CYCLOBENZAPRINE 10 MG TAB: 10 | 5 days supply | Qty: 10 | Fill #0

## 2016-11-17 NOTE — ED Provider Notes (Signed)
[facility]   CSN: [id number] Arrival date & time: 11/17/16  [id number]     History   Chief Complaint Chief Complaint  Patient presents with  . Neck Pain    HPI Heather [name] is a 55 y.o. female.  HPI   Patient is a [age] her old female with history of chronic pain, GI bleed, hypertension. She is presenting here with right neck pain that has been ongoing forover the  last month. Patient has been seen twice at this facility for the same thing. She is run out of her medication 2 times and is here for additional medication. Patient has managed to schedule an appointment with Dr. Dutch QuintPoole December 20 and a mammogram for January 3. Patient has had no change in symptoms. She has had no worsening, no worsening deficits in motor or sensory. Patient states it feels the same as it did before. However she just is running out of medication.  Past Medical History:  Diagnosis Date  . Barrett's esophageal ulceration   . Chronic back pain   . GI bleed   . Hypertension   . Pancreatic lesion     Patient Active Problem List   Diagnosis Date Noted  . GI bleed 02/25/2012  . Black stool 02/25/2012  . Nausea & vomiting 02/25/2012  . Diarrhea 02/25/2012  . SBO (small bowel obstruction) 02/25/2012  . Barrett esophagus 02/25/2012  . Leukocytosis 02/25/2012  . Hyponatremia 02/25/2012  . Anemia 02/25/2012    Past Surgical History:  Procedure Laterality Date  . ABDOMINAL HYSTERECTOMY  2003   with Left Salpingo  . 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 BACK SURGERY    . CESAREAN SECTION    . ESOPHAGOGASTRODUODENOSCOPY  02/26/2012   Procedure: ESOPHAGOGASTRODUODENOSCOPY (EGD);  Surgeon: Petra KubaMarc E Magod, MD;  Location: [facility];  Service: Endoscopy;  Laterality: N/A;  . LAPAROSCOPIC INCISIONAL / UMBILICAL / VENTRAL HERNIA REPAIR  2008  . LAPAROSCOPIC NISSEN FUNDOPLICATION  2000  . VENTRAL HERNIA REPAIR  2003    OB History    Gravida Para Term  Preterm AB Living   6 2 2   4      SAB TAB Ectopic Multiple Live Births   4               Home Medications    Prior to Admission medications   Medication Sig Start Date End Date Taking? Authorizing Provider  HYDROcodone-acetaminophen (NORCO/VICODIN) 5-325 MG tablet Take 1 tablet by mouth every 6 (six) hours as needed for moderate pain or severe pain. 08/18/16  Yes Lona KettleAshley Laurel Meyer, PA-C  omeprazole (PRILOSEC) 20 MG capsule Take 1 capsule (20 mg total) by mouth daily. 08/18/16  Yes Lona KettleAshley Laurel Meyer, PA-C  ondansetron (ZOFRAN ODT) 4 MG disintegrating tablet Take 1 tablet (4 mg total) by mouth every 8 (eight) hours as needed for nausea or vomiting. 08/18/16  Yes Lona KettleAshley Laurel Meyer, PA-C  orphenadrine (NORFLEX) 100 MG tablet Take 1 tablet (100 mg total) by mouth 2 (two) times daily as needed for muscle spasms (and pain). 11/07/16  Yes Trixie DredgeEmily West, PA-C  oxyCODONE-acetaminophen (PERCOCET/ROXICET) 5-325 MG tablet Take 1-2 tablets by mouth every 4 (four) hours as needed for moderate pain or severe pain. 11/07/16  Yes Trixie DredgeEmily West, PA-C  sucralfate (CARAFATE) 1 g tablet Take 1 tablet (1 g total) by mouth 2 (two) times daily. 08/18/16  Yes Lona KettleAshley Laurel Meyer, PA-C    Family History Family History  Problem Relation Age of Onset  . Heart failure Mother   . Diabetes Mother   . Asthma Mother   . Heart failure Father   . Heart attack Father   . Heart failure Sister   . Diabetes Sister   . Kidney failure Sister   . Heart failure Brother     Social History Social History  Substance Use Topics  . Smoking status: Current Every Day Smoker    Packs/day: 0.50    Types: Cigarettes  . Smokeless tobacco: Never Used  . Alcohol use No     Allergies   Penicillins   Review of Systems Review of Systems  Musculoskeletal: Positive for neck pain.  Neurological: Positive for numbness.  All other systems reviewed and are negative.    Physical Exam Updated Vital Signs BP 140/84 (BP Location:  Left Arm)   Pulse 105   Temp 98.4 F (36.9 C) (Oral)   Resp 16   Ht 5\' 5"  (1.651 m)   Wt 141 lb (64 kg)   SpO2 99%   BMI 23.46 kg/m   Physical Exam  Constitutional: She is oriented to person, place, and time. She appears well-developed and well-nourished.  HENT:  Head: Normocephalic and atraumatic.  Eyes: Right eye exhibits no discharge.  Cardiovascular: Normal rate, regular rhythm and normal heart sounds.   No murmur heard. Pulmonary/Chest: Effort normal and breath sounds normal. She has no wheezes. She has no rales.  Right nipple with small white bump. Right breast exam displays no large masses. No peau d'orange.  Abdominal: Soft. She exhibits no distension. There is no tenderness.  Neurological: She is oriented to person, place, and time. No cranial nerve deficit.  Good strength in right upper extremity. Sensation intact however expresses subjective numbness.  Skin: Skin is warm and dry. She is not diaphoretic.  Psychiatric: She has a normal mood and affect.  Nursing note and vitals reviewed.    ED Treatments / Results  Labs (all labs ordered are listed, but only abnormal results are displayed) Labs Reviewed - No data to display  EKG  EKG Interpretation None       Radiology No results found.  Procedures Procedures (including critical care time)  Medications Ordered in ED Medications - No data to display   Initial Impression / Assessment and Plan / ED Course  I have reviewed the triage vital signs and the nursing notes.  Pertinent labs & imaging results that were available during my care of the patient were reviewed by me and considered in my medical decision making (see chart for details).  Clinical Course    Patient is a 55 year old female presenting with right neck pain radiating down her right arm. She's had this for over 6 weeks time. Patient reports she's had imaging shows multiple discs. She has follow-up with Dr. Dutch QuintPoole on December 20. She's had no  interval increase in symptoms. Therefore do not suspect that we need to act swiftly for any surgical intervention given that she is not having worsening symptoms. Patient also has follow-up mammography. We need to have patient follow-up with her primary care physician. [facility] We told her we're not able to fill any kind of large narcotic prescription given the high degree of abuse in the area. We will give her 2 day supply with Flexeril tramadol to help her make it to her appointment.  No red flags. Do not see a need for repeat imaging  or intervention at this time. Low suspicion for other pathology given that her symptoms are unchanged and she states she is here for prescription refill.  Final Clinical Impressions(s) / ED Diagnoses   Final diagnoses:  None    New Prescriptions New Prescriptions   No medications on file     Merlyn Bollen Randall An, MD 11/17/16 [phone]

## 2016-11-17 NOTE — Discharge Instructions (Signed)
You were seen here today with continued neck and arm pain.  If you have any changes in her symptoms please return. We are sorry we are unable to fully your prescriptions again, we will give you short course of narcotic pain medication but will need to follow-up with primary care physician for continued narcotic pain medications.  Please keep your appointment with Dr. Dutch QuintPoole as well as your mammogram appointment. Please return with any change or worsening in symptoms.

## 2016-11-17 NOTE — ED Triage Notes (Signed)
C/o right neck, shoulder and arm pain, right breast pain. Pt was seen here for same. Onset 2 months ago. States she has appoint 12-20 but is out of pain meds. Denies injury.

## 2016-11-17 NOTE — Discharge Planning (Signed)
[facility]. EDCM noted that pt does not have insurance or PCP; will call pt at home to discuss PCP establishment and medication assistance options. Roderica Cathell J. Lucretia RoersWood, RN, BSN, [place] [phone]

## 2017-01-30 ENCOUNTER — Emergency Department (HOSPITAL_BASED_OUTPATIENT_CLINIC_OR_DEPARTMENT_OTHER)
Admission: EM | Admit: 2017-01-30 | Discharge: 2017-01-30 | Disposition: A | Discharge status: Home / Self Care | Payer: Self-pay | Attending: Emergency Medicine | Admitting: Emergency Medicine

## 2017-01-30 ENCOUNTER — Encounter (HOSPITAL_BASED_OUTPATIENT_CLINIC_OR_DEPARTMENT_OTHER): Payer: Self-pay

## 2017-01-30 ENCOUNTER — Emergency Department (HOSPITAL_BASED_OUTPATIENT_CLINIC_OR_DEPARTMENT_OTHER): Payer: Self-pay

## 2017-01-30 DIAGNOSIS — Y929 Unspecified place or not applicable: Secondary | ICD-10-CM | POA: Insufficient documentation

## 2017-01-30 DIAGNOSIS — X58XXXA Exposure to other specified factors, initial encounter: Secondary | ICD-10-CM | POA: Insufficient documentation

## 2017-01-30 DIAGNOSIS — M542 Cervicalgia: Secondary | ICD-10-CM | POA: Insufficient documentation

## 2017-01-30 DIAGNOSIS — G8929 Other chronic pain: Secondary | ICD-10-CM | POA: Insufficient documentation

## 2017-01-30 DIAGNOSIS — Y939 Activity, unspecified: Secondary | ICD-10-CM | POA: Insufficient documentation

## 2017-01-30 DIAGNOSIS — I10 Essential (primary) hypertension: Secondary | ICD-10-CM | POA: Insufficient documentation

## 2017-01-30 DIAGNOSIS — Y999 Unspecified external cause status: Secondary | ICD-10-CM | POA: Insufficient documentation

## 2017-01-30 DIAGNOSIS — S93492A Sprain of other ligament of left ankle, initial encounter: Secondary | ICD-10-CM | POA: Insufficient documentation

## 2017-01-30 DIAGNOSIS — F17210 Nicotine dependence, cigarettes, uncomplicated: Secondary | ICD-10-CM | POA: Insufficient documentation

## 2017-01-30 MED ORDER — ACETAMINOPHEN 500 MG PO TABS
1000.0000 mg | ORAL_TABLET | Freq: Once | ORAL | Status: AC
  Administered 2017-01-30: 1000 mg via ORAL
  Filled 2017-01-30: qty 2

## 2017-01-30 MED ORDER — OXYCODONE HCL 5 MG PO TABS
5.0000 mg | ORAL_TABLET | Freq: Once | ORAL | Status: AC
  Administered 2017-01-30: 5 mg via ORAL
  Filled 2017-01-30: qty 1

## 2017-01-30 MED ORDER — DIAZEPAM 5 MG PO TABS
5.0000 mg | ORAL_TABLET | Freq: Once | ORAL | Status: AC
  Administered 2017-01-30: 5 mg via ORAL
  Filled 2017-01-30: qty 1

## 2017-01-30 NOTE — ED Notes (Signed)
Pt refused wheelchair in triage. 

## 2017-01-30 NOTE — Discharge Planning (Signed)
Pt up for discharge. EDCM reviewed chart for possible CM needs.  No needs identified or communicated.  

## 2017-01-30 NOTE — ED Triage Notes (Signed)
Patient complains of chronic neck pain since November. States that she has been told she has some disc disease in her neck and has intermittent right arm numbness. Also states that she missed step on her porch and rolled left ankle on Saturday, no obvious deformity. Alert and oriented, NAD

## 2017-01-30 NOTE — ED Provider Notes (Signed)
[facility] Provider Note   CSN: [id number] Arrival date & time: 01/30/17  4098     History   Chief Complaint Chief Complaint  Patient presents with  . neck pain/arm pain    HPI Heather [name] is a 56 y.o. female.  56 yo F with a chief complaint of chronic neck pain and left ankle pain. Patient states that she has been feeling generally weak over the past couple weeks. She thinks that's what made her fell and she inverted her left ankle. Has been able to walk on it without difficulty. Complaining of pain to bilateral malleoli. Denies any other injury in the fall. Has had radiation of pain down her right arm. She has tried to follow-up with her neurosurgeon but is unable because she has no funds.   The history is provided by the patient.  Injury  This is a new problem. The current episode started more than 2 days ago. The problem occurs constantly. The problem has not changed since onset.Pertinent negatives include no chest pain, no headaches and no shortness of breath. The symptoms are aggravated by walking, bending and twisting. Nothing relieves the symptoms. She has tried nothing for the symptoms. The treatment provided no relief.    Past Medical History:  Diagnosis Date  . Barrett's esophageal ulceration   . Chronic back pain   . GI bleed   . Hypertension   . Pancreatic lesion     Patient Active Problem List   Diagnosis Date Noted  . GI bleed 02/25/2012  . Black stool 02/25/2012  . Nausea & vomiting 02/25/2012  . Diarrhea 02/25/2012  . SBO (small bowel obstruction) 02/25/2012  . Barrett esophagus 02/25/2012  . Leukocytosis 02/25/2012  . Hyponatremia 02/25/2012  . Anemia 02/25/2012    Past Surgical History:  Procedure Laterality Date  . ABDOMINAL HYSTERECTOMY  2003   with Left Salpingo  . ABDOMINAL SURGERY     at age 32 for malrotation; appendectomy  . BACK SURGERY    . CESAREAN SECTION    . ESOPHAGOGASTRODUODENOSCOPY  02/26/2012   Procedure:  ESOPHAGOGASTRODUODENOSCOPY (EGD);  Surgeon: Petra Kuba, MD;  Location: [facility];  Service: Endoscopy;  Laterality: N/A;  . LAPAROSCOPIC INCISIONAL / UMBILICAL / VENTRAL HERNIA REPAIR  2008  . LAPAROSCOPIC NISSEN FUNDOPLICATION  2000  . VENTRAL HERNIA REPAIR  2003    OB History    Gravida Para Term Preterm AB Living   6 2 2   4      SAB TAB Ectopic Multiple Live Births   4               Home Medications    Prior to Admission medications   Not on File    Family History Family History  Problem Relation Age of Onset  . Heart failure Mother   . Diabetes Mother   . Asthma Mother   . Heart failure Father   . Heart attack Father   . Heart failure Sister   . Diabetes Sister   . Kidney failure Sister   . Heart failure Brother     Social History Social History  Substance Use Topics  . Smoking status: Current Every Day Smoker    Packs/day: 0.50    Types: Cigarettes  . Smokeless tobacco: Never Used  . Alcohol use No     Allergies   Penicillins   Review of Systems Review of Systems  Constitutional: Negative for chills and fever.  HENT: Negative for congestion and rhinorrhea.  Eyes: Negative for redness and visual disturbance.  Respiratory: Negative for shortness of breath and wheezing.   Cardiovascular: Negative for chest pain and palpitations.  Gastrointestinal: Negative for nausea and vomiting.  Genitourinary: Negative for dysuria and urgency.  Musculoskeletal: Positive for arthralgias and myalgias.  Skin: Negative for pallor and wound.  Neurological: Negative for dizziness and headaches.     Physical Exam Updated Vital Signs BP 147/97 (BP Location: Left Arm)   Pulse 89   Temp 98.6 F (37 C) (Oral)   Resp 18   Ht 5\' 5"  (1.651 m)   Wt 145 lb (65.8 kg)   SpO2 91%   BMI 24.13 kg/m   Physical Exam  Constitutional: She is oriented to person, place, and time. She appears well-developed and well-nourished. No distress.  HENT:  Head: Normocephalic  and atraumatic.  Eyes: EOM are normal. Pupils are equal, round, and reactive to light.  Neck: Normal range of motion. Neck supple.  Cardiovascular: Normal rate and regular rhythm.  Exam reveals no gallop and no friction rub.   No murmur heard. Pulmonary/Chest: Effort normal. She has no wheezes. She has no rales.  Abdominal: Soft. She exhibits no distension. There is no tenderness.  Musculoskeletal: She exhibits no edema or tenderness.  Postoperative sensation is intact all 4 extremities. reflexes 2+. Tender palpation about the right trapezius muscle belly.  Left ankle with diffuse pain.  Neurological: She is alert and oriented to person, place, and time.  Skin: Skin is warm and dry. She is not diaphoretic.  Psychiatric: She has a normal mood and affect. Her behavior is normal.  Nursing note and vitals reviewed.    ED Treatments / Results  Labs (all labs ordered are listed, but only abnormal results are displayed) Labs Reviewed - No data to display  EKG  EKG Interpretation None       Radiology Dg Ankle Complete Left  Result Date: 01/30/2017 CLINICAL DATA:  Status post left ankle injury 2 days ago. Left ankle pain. EXAM: LEFT ANKLE COMPLETE - 3+ VIEW COMPARISON:  None. FINDINGS: There is no evidence of fracture, dislocation, or joint effusion. There is no evidence of arthropathy or other focal bone abnormality. Soft tissues are unremarkable. IMPRESSION: No acute osseous injury of the left ankle. Electronically Signed   By: Elige Ko   On: 01/30/2017 10:56    Procedures Procedures (including critical care time)  Medications Ordered in ED Medications  acetaminophen (TYLENOL) tablet 1,000 mg (1,000 mg Oral Given 01/30/17 1054)  oxyCODONE (Oxy IR/ROXICODONE) immediate release tablet 5 mg (5 mg Oral Given 01/30/17 1054)  diazepam (VALIUM) tablet 5 mg (5 mg Oral Given 01/30/17 1054)     Initial Impression / Assessment and Plan / ED Course  I have reviewed the triage vital  signs and the nursing notes.  Pertinent labs & imaging results that were available during my care of the patient were reviewed by me and considered in my medical decision making (see chart for details).     56 yo F With a chief complaint of chronic neck pain and left ankle pain. Will obtain an plain film of the left ankle. Discussed with the patient that she needs to follow-up with her spinal surgeon for further evaluation of their neck pain.    3:34 PM:  I have discussed the diagnosis/risks/treatment options with the patient and family and believe the pt to be eligible for discharge home to follow-up with PCP. We also discussed returning to the ED immediately if new  or worsening sx occur. We discussed the sx which are most concerning (e.g., sudden worsening pain, fever, inability to tolerate by mouth) that necessitate immediate return. Medications administered to the patient during their visit and any new prescriptions provided to the patient are listed below.  Medications given during this visit Medications  acetaminophen (TYLENOL) tablet 1,000 mg (1,000 mg Oral Given 01/30/17 1054)  oxyCODONE (Oxy IR/ROXICODONE) immediate release tablet 5 mg (5 mg Oral Given 01/30/17 1054)  diazepam (VALIUM) tablet 5 mg (5 mg Oral Given 01/30/17 1054)     The patient appears reasonably screen and/or stabilized for discharge and I doubt any other medical condition or other [facility] requiring further screening, evaluation, or treatment in the ED at this time prior to discharge.   Final Clinical Impressions(s) / ED Diagnoses   Final diagnoses:  Chronic neck pain  Sprain of anterior talofibular ligament of left ankle, initial encounter    New Prescriptions There are no discharge medications for this patient.    Melene Planan Cedric Denison, DO 01/30/17 1534

## 2017-01-30 NOTE — Discharge Instructions (Signed)
Take tylenol 1000mg(2 extra strength) four times a day.  ° °

## 2017-05-12 ENCOUNTER — Emergency Department (HOSPITAL_COMMUNITY): Payer: Self-pay

## 2017-05-12 ENCOUNTER — Emergency Department (HOSPITAL_COMMUNITY)
Admission: EM | Admit: 2017-05-12 | Discharge: 2017-05-12 | Disposition: A | Discharge status: Home / Self Care | Payer: Self-pay | Attending: Emergency Medicine | Admitting: Emergency Medicine

## 2017-05-12 DIAGNOSIS — F17210 Nicotine dependence, cigarettes, uncomplicated: Secondary | ICD-10-CM | POA: Insufficient documentation

## 2017-05-12 DIAGNOSIS — I10 Essential (primary) hypertension: Secondary | ICD-10-CM | POA: Insufficient documentation

## 2017-05-12 DIAGNOSIS — M542 Cervicalgia: Secondary | ICD-10-CM | POA: Insufficient documentation

## 2017-05-12 DIAGNOSIS — M545 Low back pain, unspecified: Secondary | ICD-10-CM

## 2017-05-12 DIAGNOSIS — G8929 Other chronic pain: Secondary | ICD-10-CM | POA: Insufficient documentation

## 2017-05-12 DIAGNOSIS — R0602 Shortness of breath: Secondary | ICD-10-CM | POA: Insufficient documentation

## 2017-05-12 LAB — COMPREHENSIVE METABOLIC PANEL
ALT: 20 U/L (ref 14–54)
ANION GAP: 9 (ref 5–15)
AST: 16 U/L (ref 15–41)
Albumin: 4 g/dL (ref 3.5–5.0)
Alkaline Phosphatase: 70 U/L (ref 38–126)
BUN: 11 mg/dL (ref 6–20)
CHLORIDE: 103 mmol/L (ref 101–111)
CO2: 26 mmol/L (ref 22–32)
Calcium: 9.8 mg/dL (ref 8.9–10.3)
Creatinine, Ser: 0.77 mg/dL (ref 0.44–1.00)
GFR calc non Af Amer: >60 mL/min (ref >60)
Glucose, Bld: 98 mg/dL (ref 65–99)
Potassium: 3.7 mmol/L (ref 3.5–5.1)
SODIUM: 138 mmol/L (ref 135–145)
Total Bilirubin: 0.4 mg/dL (ref 0.3–1.2)
Total Protein: 6.8 g/dL (ref 6.5–8.1)

## 2017-05-12 LAB — CBC WITH DIFFERENTIAL/PLATELET
BASOS ABS: 0.1 10*3/uL (ref 0.0–0.1)
Basophils Relative: 1 %
EOS PCT: 4 %
Eosinophils Absolute: 0.4 10*3/uL (ref 0.0–0.7)
HCT: 46.3 % — ABNORMAL HIGH (ref 36.0–46.0)
Hemoglobin: 16.1 g/dL — ABNORMAL HIGH (ref 12.0–15.0)
LYMPHS PCT: 36 %
Lymphs Abs: 3.4 10*3/uL (ref 0.7–4.0)
MCH: 31.6 pg (ref 26.0–34.0)
MCHC: 34.8 g/dL (ref 30.0–36.0)
MCV: 90.8 fL (ref 78.0–100.0)
Monocytes Absolute: 0.6 10*3/uL (ref 0.1–1.0)
Monocytes Relative: 7 %
Neutro Abs: 4.8 10*3/uL (ref 1.7–7.7)
Neutrophils Relative %: 52 %
PLATELETS: 280 10*3/uL (ref 150–400)
RBC: 5.1 MIL/uL (ref 3.87–5.11)
RDW: 12.5 % (ref 11.5–15.5)
WBC: 9.3 10*3/uL (ref 4.0–10.5)

## 2017-05-12 LAB — D-DIMER, QUANTITATIVE: D-Dimer, Quant: <0.27 ug/mL-FEU (ref 0.00–0.50)

## 2017-05-12 LAB — BRAIN NATRIURETIC PEPTIDE: B NATRIURETIC PEPTIDE 5: 13 pg/mL (ref 0.0–100.0)

## 2017-05-12 LAB — TROPONIN I: Troponin I: <0.03 ng/mL (ref <0.03)

## 2017-05-12 MED ORDER — CYCLOBENZAPRINE HCL 10 MG PO TABS: 10.0000 mg | ORAL_TABLET | Freq: Three times a day (TID) | ORAL | 0 refills | Status: DC | PRN

## 2017-05-12 MED ORDER — DEXAMETHASONE SODIUM PHOSPHATE 10 MG/ML IJ SOLN
10.0000 mg | Freq: Once | INTRAMUSCULAR | Status: AC
  Administered 2017-05-12: 10 mg via INTRAMUSCULAR
  Filled 2017-05-12: qty 1

## 2017-05-12 MED ORDER — PREDNISONE 20 MG PO TABS: ORAL_TABLET | ORAL | 0 refills | Status: DC

## 2017-05-12 MED ORDER — CYCLOBENZAPRINE HCL 10 MG PO TABS
10.0000 mg | ORAL_TABLET | Freq: Once | ORAL | Status: AC
  Administered 2017-05-12: 10 mg via ORAL
  Filled 2017-05-12: qty 1

## 2017-05-12 MED ORDER — KETOROLAC TROMETHAMINE 60 MG/2ML IM SOLN
60.0000 mg | Freq: Once | INTRAMUSCULAR | Status: AC
  Administered 2017-05-12: 60 mg via INTRAMUSCULAR
  Filled 2017-05-12: qty 2

## 2017-05-12 MED ORDER — OMEPRAZOLE 20 MG PO CPDR: DELAYED_RELEASE_CAPSULE | ORAL | 0 refills | Status: DC

## 2017-05-12 NOTE — ED Triage Notes (Signed)
PT IS HAVING SOB SHE IS HAVING SOB WHEN SHE LYING. SHE IS HAVING BACK PAIN THAT GOES DOWN HER BUTTOCKS AND LEGS.

## 2017-05-12 NOTE — ED Provider Notes (Signed)
[facility] Provider Note   CSN: [id number] Arrival date & time: 05/12/17  0211  Time seen 02:30 AM   History   Chief Complaint Chief Complaint  Patient presents with  . Shortness of Breath  . Back Pain    HPI Heather [name] is a 56 y.o. female.  HPI patient is here with several complaints. She states she started feeling short of breath like, running" a few days ago but seemed to get worse today. She states him "miserable". She states she feels like her heart is racing however that is chronic and she states her heart rate is normally high. She states tonight she started coughing up white frothy sputum. She states she has a "pinch in my chest" and points to the subxiphoid area that she states is not bad in occurs rarely. She denies fever, she states she's had swelling of her ankles for years. She denies rhinorrhea, sneezing, sore throat, or wheezing. She states she's felt this way before when she had a GI bleed. She has been taking Motrin for her back pain and she has been watching in her bowel movements are not bloody and are not melanotic. She had stopped taking the Motrin 2 days ago due to epigastric distress. She states she has low back pain after spinal fusion and has been having neck pain for the last 5-6 months. Her surgery on her back was done by Dr. Dutch QuintPoole however she cannot see him without paying money up front. She states she was told she had a disc problem in her neck when she was seen in the ED several months ago. She denies any numbness or tingling of her extremities. She denies any rectal or urinary incontinence. She [facility] however she doesn't have transportation to go see them.  [facility]   Past Medical History:  Diagnosis Date  . Barrett's esophageal ulceration   . Chronic back pain   . GI bleed   . Hypertension   . Pancreatic lesion     Patient Active Problem List   Diagnosis Date  Noted  . GI bleed 02/25/2012  . Black stool 02/25/2012  . Nausea & vomiting 02/25/2012  . Diarrhea 02/25/2012  . SBO (small bowel obstruction) (HCC) 02/25/2012  . Barrett esophagus 02/25/2012  . Leukocytosis 02/25/2012  . Hyponatremia 02/25/2012  . Anemia 02/25/2012    Past Surgical History:  Procedure Laterality Date  . ABDOMINAL HYSTERECTOMY  2003   with Left Salpingo  . ABDOMINAL SURGERY     at age 89 for malrotation; appendectomy  . BACK SURGERY    . CESAREAN SECTION    . ESOPHAGOGASTRODUODENOSCOPY  02/26/2012   Procedure: ESOPHAGOGASTRODUODENOSCOPY (EGD);  Surgeon: Petra KubaMarc E Magod, MD;  [facility];  Service: Endoscopy;  Laterality: N/A;  . LAPAROSCOPIC INCISIONAL / UMBILICAL / VENTRAL HERNIA REPAIR  2008  . LAPAROSCOPIC NISSEN FUNDOPLICATION  2000  . VENTRAL HERNIA REPAIR  2003    OB History    Gravida Para Term Preterm AB Living   6 2 2   4      SAB TAB Ectopic Multiple Live Births   4               Home Medications    Prior to Admission medications   Medication Sig Start Date End Date Taking? Authorizing Provider  cyclobenzaprine (FLEXERIL) 10 MG tablet Take 1 tablet (10 mg total) by mouth 3 (three) times daily as needed for  muscle spasms. 05/12/17   Devoria Albe, MD  omeprazole (PRILOSEC) 20 MG capsule Take 1 po BID x 2 weeks then once a day 05/12/17   Devoria Albe, MD  predniSONE (DELTASONE) 20 MG tablet Take 3 po QD x 3d , then 2 po QD x 3d then 1 po QD x 3d 05/12/17   Devoria Albe, MD    Family History Family History  Problem Relation Age of Onset  . Heart failure Mother   . Diabetes Mother   . Asthma Mother   . Heart failure Father   . Heart attack Father   . Heart failure Sister   . Diabetes Sister   . Kidney failure Sister   . Heart failure Brother     Social History Social History  Substance Use Topics  . Smoking status: Current Every Day Smoker    Packs/day: 0.50    Types: Cigarettes  . Smokeless tobacco: Never Used  . Alcohol use No    on disability   Allergies   Penicillins   Review of Systems Review of Systems  All other systems reviewed and are negative.    Physical Exam Updated Vital Signs BP (!) 154/98 (BP Location: Left Arm)   Pulse 89   Temp 98 F (36.7 C) (Oral)   Resp 20   Ht 5\' 5"  (1.651 m)   Wt 65.8 kg (145 lb)   SpO2 98%   BMI 24.13 kg/m   Vital signs normal    Physical Exam  Constitutional: She is oriented to person, place, and time. She appears well-developed and well-nourished.  Non-toxic appearance. She does not appear ill. No distress.  HENT:  Head: Normocephalic and atraumatic.  Right Ear: External ear normal.  Left Ear: External ear normal.  Nose: Nose normal. No mucosal edema or rhinorrhea.  Mouth/Throat: Oropharynx is clear and moist and mucous membranes are normal. No dental abscesses or uvula swelling.  Eyes: Conjunctivae and EOM are normal. Pupils are equal, round, and reactive to light.  Neck: Normal range of motion and full passive range of motion without pain. Neck supple.    Tender diffusely over the cervical spine with minimal tenderness of the paraspinous muscles of the cervical spine.  Cardiovascular: Normal rate, regular rhythm and normal heart sounds.  Exam reveals no gallop and no friction rub.   No murmur heard. Pulmonary/Chest: Effort normal and breath sounds normal. No respiratory distress. She has no wheezes. She has no rhonchi. She has no rales. She exhibits no tenderness and no crepitus.  Abdominal: Soft. Normal appearance and bowel sounds are normal. She exhibits no distension. There is no tenderness. There is no rebound and no guarding.  Musculoskeletal: Normal range of motion. She exhibits tenderness. She exhibits no edema.       Back:  Moves all extremities well. She has tenderness diffusely to palpation of her mid thoracic spine distally into the lumbar spine. She does not have any focal tenderness.  Neurological: She is alert and oriented to person,  place, and time. She has normal strength. No cranial nerve deficit.  Skin: Skin is warm, dry and intact. No rash noted. No erythema. No pallor.  Psychiatric: She has a normal mood and affect. Her speech is normal and behavior is normal. Her mood appears not anxious.  Nursing note and vitals reviewed.    ED Treatments / Results  Labs (all labs ordered are listed, but only abnormal results are displayed) Results for orders placed or performed during the hospital encounter of  05/12/17  Comprehensive metabolic panel  Result Value Ref Range   Sodium 138 135 - 145 mmol/L   Potassium 3.7 3.5 - 5.1 mmol/L   Chloride 103 101 - 111 mmol/L   CO2 26 22 - 32 mmol/L   Glucose, Bld 98 65 - 99 mg/dL   BUN 11 6 - 20 mg/dL   Creatinine, Ser 4.09 0.44 - 1.00 mg/dL   Calcium 9.8 8.9 - 81.1 mg/dL   Total Protein 6.8 6.5 - 8.1 g/dL   Albumin 4.0 3.5 - 5.0 g/dL   AST 16 15 - 41 U/L   ALT 20 14 - 54 U/L   Alkaline Phosphatase 70 38 - 126 U/L   Total Bilirubin 0.4 0.3 - 1.2 mg/dL   GFR calc non Af Amer >60 >60 mL/min   GFR calc Af Amer >60 >60 mL/min   Anion gap 9 5 - 15  Troponin I  Result Value Ref Range   Troponin I <0.03 <0.03 ng/mL  Brain natriuretic peptide  Result Value Ref Range   B Natriuretic Peptide 13.0 0.0 - 100.0 pg/mL  CBC with Differential  Result Value Ref Range   WBC 9.3 4.0 - 10.5 K/uL   RBC 5.10 3.87 - 5.11 MIL/uL   Hemoglobin 16.1 (H) 12.0 - 15.0 g/dL   HCT 91.4 (H) 78.2 - 95.6 %   MCV 90.8 78.0 - 100.0 fL   MCH 31.6 26.0 - 34.0 pg   MCHC 34.8 30.0 - 36.0 g/dL   RDW 21.3 08.6 - 57.8 %   Platelets 280 150 - 400 K/uL   Neutrophils Relative % 52 %   Neutro Abs 4.8 1.7 - 7.7 K/uL   Lymphocytes Relative 36 %   Lymphs Abs 3.4 0.7 - 4.0 K/uL   Monocytes Relative 7 %   Monocytes Absolute 0.6 0.1 - 1.0 K/uL   Eosinophils Relative 4 %   Eosinophils Absolute 0.4 0.0 - 0.7 K/uL   Basophils Relative 1 %   Basophils Absolute 0.1 0.0 - 0.1 K/uL  D-dimer, quantitative  Result  Value Ref Range   D-Dimer, Quant <0.27 0.00 - 0.50 ug/mL-FEU   Laboratory interpretation all normal except elevated Hb    EKG  EKG Interpretation  Date/Time:  Friday May 12 2017 02:46:38 EDT Ventricular Rate:  82 PR Interval:    QRS Duration: 82 QT Interval:  376 QTC Calculation: 440 R Axis:   58 Text Interpretation:  Sinus rhythm Probable left atrial enlargement RSR' in V1 or V2, right VCD or RVH No significant change since last tracing 21 Oct 2016 Confirmed by Devoria Albe ([id number]) on 05/12/2017 3:26:59 AM       Radiology Dg Chest 2 View  Result Date: 05/12/2017 CLINICAL DATA:  Worsening shortness of breath for 3 days. Lower chest discomfort. Cough. History of hypertension. Bronchitis. Smoker. EXAM: CHEST  2 VIEW COMPARISON:  10/21/2016 FINDINGS: Heart size and pulmonary vascularity are normal. Slight interstitial change centrally with peribronchial thickening suggesting chronic bronchitis. Mild emphysematous changes. Blunting of the right costophrenic angle suggesting a small pleural effusion. No focal consolidation. No pneumothorax. Calcification of the aorta. Surgical clips in the left upper quadrant. IMPRESSION: Emphysematous and chronic bronchitic changes in the lungs. Small right pleural effusion. No focal consolidation. Electronically Signed   By: Burman Nieves M.D.   On: 05/12/2017 03:58     Show images for CT Cervical Spine Wo Contrast  Study Result   CLINICAL DATA:  56 year old female with right lateral neck pain radiating to the  axilla and breast for 2 weeks. Right nipple drainage, breast swelling and node enlargement. Initial encounter.  EXAM: CT CERVICAL SPINE WITHOUT CONTRAST  TECHNIQUE: Multidetector CT imaging of the cervical spine was performed without intravenous contrast. Multiplanar CT image reconstructions were also generated.  COMPARISON:  Cervical spine CT 11/30/2010. Chest CT from today reported separately.  FINDINGS: Alignment: Improved  cervical lordosis compared to 2011. Residual mild straightening. Cervicothoracic junction alignment is within normal limits. Bilateral posterior element alignment is within normal limits.  Skull base and vertebrae: Visualized skull base is intact. No atlanto-occipital dissociation. Visualized paranasal sinuses and mastoids are stable and well pneumatized. Negative visualized posterior fossa.  No cervical spine fracture.  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 Motion artifact at the oropharynx. Otherwise negative noncontrast neck soft tissues.  Disc levels:  C2-C3: Small central disc protrusion. Mild left facet hypertrophy. No significant stenosis.  C3-C4: Circumferential disc bulge. Moderate to severe left facet hypertrophy. Left greater than right uncovertebral hypertrophy. Broad-based posterior component of disc. Up to mild spinal stenosis and severe left C4 foraminal stenosis.  C4-C5: Mild disc bulge. Mild left greater than right facet hypertrophy. No stenosis.  C5-C6: Mild left greater than right facet hypertrophy. Mild uncovertebral hypertrophy. Minimal disc bulge. Up to mild left C6 foraminal stenosis.  C6-C7: Circumferential disc bulge with broad-based posterior component. Mild facet and left greater than right uncovertebral hypertrophy. Borderline to mild spinal stenosis. Moderate left and mild to moderate right C7 foraminal stenosis.  C7-T1: Uncovertebral hypertrophy greater on the left. No spinal or definite right foraminal stenosis. Moderate left C8 foraminal stenosis.  Upper chest: Negative lung apices. Visualized upper thoracic levels appear intact. Chest CT today is reported separately.  IMPRESSION: 1.  No acute osseous abnormality in the cervical spine. 2. Multilevel cervical  disc and mostly left side facet and endplate degeneration. Up to mild degenerative spinal stenosis at C3-C4 and C6-C7. Severe left C4 neural foraminal stenosis. Moderate left greater than right C7 and C8 neural foraminal stenosis. 3. Chest CT today reported separately.   Electronically Signed   By: Odessa Fleming M.D.   On: 10/21/2016 14:41      Procedures Procedures (including critical care time)  Medications Ordered in ED Medications  ketorolac (TORADOL) injection 60 mg (60 mg Intramuscular Given 05/12/17 0306)  cyclobenzaprine (FLEXERIL) tablet 10 mg (10 mg Oral Given 05/12/17 0307)  dexamethasone (DECADRON) injection 10 mg (10 mg Intramuscular Given 05/12/17 0306)     Initial Impression / Assessment and Plan / ED Course  I have reviewed the triage vital signs and the nursing notes.  Pertinent labs & imaging results that were available during my care of the patient were reviewed by me and considered in my medical decision making (see chart for details).  Patient has already had a CT of her cervical spine done in November 2017. She was given Decadron, Toradol, and Flexeril orally for her discomfort while waiting for her testing to be done.  Recheck at 5:30 AM patient is sleeping, when awake and she states she's feeling better. We discussed her test results. I also discussed she needs to get a primary care doctor to start managing her chronic pain.   Review of the [place] shows patient got #15 tramadol 50 mg tablets on November 30 and #11 oxycodone 5/325 on November 30 from  the emergency department  Final Clinical Impressions(s) / ED Diagnoses   Final diagnoses:  Neck pain  Chronic midline low back pain without sciatica    New Prescriptions New Prescriptions   CYCLOBENZAPRINE (FLEXERIL) 10 MG TABLET    Take 1 tablet (10 mg total) by mouth 3 (three) times daily as needed for muscle spasms.   OMEPRAZOLE (PRILOSEC) 20 MG CAPSULE    Take 1 po BID x 2 weeks then  once a day   PREDNISONE (DELTASONE) 20 MG TABLET    Take 3 po QD x 3d , then 2 po QD x 3d then 1 po QD x 3d   Plan discharge  Devoria Albe, MD, Concha Pyo, MD 05/12/17 [phone]

## 2017-05-12 NOTE — Discharge Instructions (Signed)
Use ice and heat for comfort. Take the medications as prescribed. You will need to see your primary care doctor for further treatment and evaluation of your neck and back pain.

## 2018-06-10 ENCOUNTER — Emergency Department (HOSPITAL_BASED_OUTPATIENT_CLINIC_OR_DEPARTMENT_OTHER)
Admission: EM | Admit: 2018-06-10 | Discharge: 2018-06-10 | Disposition: A | Discharge status: Home / Self Care | Payer: Medicare Other | Attending: Emergency Medicine | Admitting: Emergency Medicine

## 2018-06-10 ENCOUNTER — Encounter (HOSPITAL_BASED_OUTPATIENT_CLINIC_OR_DEPARTMENT_OTHER): Payer: Self-pay | Admitting: Emergency Medicine

## 2018-06-10 ENCOUNTER — Emergency Department (HOSPITAL_BASED_OUTPATIENT_CLINIC_OR_DEPARTMENT_OTHER): Payer: Medicare Other

## 2018-06-10 ENCOUNTER — Other Ambulatory Visit: Payer: Self-pay

## 2018-06-10 DIAGNOSIS — I10 Essential (primary) hypertension: Secondary | ICD-10-CM | POA: Diagnosis not present

## 2018-06-10 DIAGNOSIS — R1310 Dysphagia, unspecified: Secondary | ICD-10-CM | POA: Insufficient documentation

## 2018-06-10 DIAGNOSIS — F17210 Nicotine dependence, cigarettes, uncomplicated: Secondary | ICD-10-CM | POA: Insufficient documentation

## 2018-06-10 MED ORDER — ACETAMINOPHEN 325 MG PO TABS
650.0000 mg | ORAL_TABLET | Freq: Once | ORAL | Status: AC
  Administered 2018-06-10: 650 mg via ORAL
  Filled 2018-06-10: qty 2

## 2018-06-10 NOTE — ED Provider Notes (Signed)
[facility] EMERGENCY DEPARTMENT Provider Note   CSN: [id number] Arrival date & time: 06/10/18  1025     History   Chief Complaint Chief Complaint  Patient presents with  . Neck pain    HPI Heather [name] is a 57 y.o. female.  HPI  57 year old female presents with difficulty swallowing and lower anterior neck/upper chest pain.  She points to her medial clavicle as an area with some swelling and discomfort.  It is tender to the touch.  This is been ongoing for about a week.  She states that not every time but often she will have difficulty swallowing.  It feels like something gets stuck and she has to swallow harder or push it down with liquids.  However she is having no trouble swallowing her spit/saliva.  She has not been having any vomiting.  She has had a little bit of a cough.  No sore throat or anterior neck swelling.  No decreased range of motion of her neck.  She has chronic spinal stenosis in her cervical spine but does not think this is related.  Past Medical History:  Diagnosis Date  . Barrett's esophageal ulceration   . Chronic back pain   . GI bleed   . Hypertension   . Pancreatic lesion     Patient Active Problem List   Diagnosis Date Noted  . GI bleed 02/25/2012  . Black stool 02/25/2012  . Nausea & vomiting 02/25/2012  . Diarrhea 02/25/2012  . SBO (small bowel obstruction) (HCC) 02/25/2012  . Barrett esophagus 02/25/2012  . Leukocytosis 02/25/2012  . Hyponatremia 02/25/2012  . Anemia 02/25/2012    Past Surgical History:  Procedure Laterality Date  . ABDOMINAL HYSTERECTOMY  2003   with Left Salpingo  . ABDOMINAL SURGERY     at age 57 for malrotation; appendectomy  . BACK SURGERY    . CESAREAN SECTION    . ESOPHAGOGASTRODUODENOSCOPY  02/26/2012   Procedure: ESOPHAGOGASTRODUODENOSCOPY (EGD);  Surgeon: Petra KubaMarc E Magod, MD;  Location: [facility];  Service: Endoscopy;  Laterality: N/A;  . LAPAROSCOPIC INCISIONAL / UMBILICAL / VENTRAL HERNIA  REPAIR  2008  . LAPAROSCOPIC NISSEN FUNDOPLICATION  2000  . VENTRAL HERNIA REPAIR  2003     OB History    Gravida  6   Para  2   Term  2   Preterm      AB  4   Living        SAB  4   TAB      Ectopic      Multiple      Live Births               Home Medications    Prior to Admission medications   Medication Sig Start Date End Date Taking? Authorizing Provider  acetaminophen (TYLENOL) 325 MG tablet Take 650 mg by mouth every 6 (six) hours as needed.   Yes [provider]  ibuprofen (ADVIL,MOTRIN) 800 MG tablet Take 800 mg by mouth every 8 (eight) hours as needed.   Yes [provider]  cyclobenzaprine (FLEXERIL) 10 MG tablet Take 1 tablet (10 mg total) by mouth 3 (three) times daily as needed for muscle spasms. 05/12/17   Devoria AlbeKnapp, Iva, MD  omeprazole (PRILOSEC) 20 MG capsule Take 1 po BID x 2 weeks then once a day 05/12/17   Devoria AlbeKnapp, Iva, MD  predniSONE (DELTASONE) 20 MG tablet Take 3 po QD x 3d , then 2 po QD x 3d then  1 po QD x 3d 05/12/17   Devoria Albe, MD  clonazePAM (KLONOPIN) 0.5 MG tablet Take 0.5 mg by mouth as needed.    02/25/12  [provider]    Family History Family History  Problem Relation Age of Onset  . Heart failure Mother   . Diabetes Mother   . Asthma Mother   . Heart failure Father   . Heart attack Father   . Heart failure Sister   . Diabetes Sister   . Kidney failure Sister   . Heart failure Brother     Social History Social History   Tobacco Use  . Smoking status: Current Every Day Smoker    Packs/day: 0.50    Types: Cigarettes  . Smokeless tobacco: Never Used  Substance Use Topics  . Alcohol use: No  . Drug use: No     Allergies   Penicillins   Review of Systems Review of Systems  Constitutional: Negative for fever.  HENT: Positive for trouble swallowing.   Respiratory: Positive for cough. Negative for shortness of breath (chronic dyspnea, unchanged).   Gastrointestinal: Negative for abdominal  pain and vomiting.  Musculoskeletal: Positive for neck pain.  All other systems reviewed and are negative.    Physical Exam Updated Vital Signs BP 123/72   Pulse 83   Temp 98.4 F (36.9 C) (Oral)   Resp 18   Ht 5\' 5"  (1.651 m)   Wt 63.5 kg (140 lb)   SpO2 99%   BMI 23.30 kg/m   Physical Exam  Constitutional: She is oriented to person, place, and time. She appears well-developed and well-nourished. No distress.  HENT:  Head: Normocephalic and atraumatic.  Right Ear: External ear normal.  Left Ear: External ear normal.  Nose: Nose normal.  Mouth/Throat: Uvula is midline and oropharynx is clear and moist. No uvula swelling.  Eyes: Right eye exhibits no discharge. Left eye exhibits no discharge.  Neck: Normal range of motion. Neck supple.  No anterior neck swelling/tenderness appreciated  Cardiovascular: Normal rate, regular rhythm and normal heart sounds.  Pulmonary/Chest: Effort normal and breath sounds normal. She exhibits tenderness and swelling.    Abdominal: Soft. There is no tenderness.  Lymphadenopathy:    She has no cervical adenopathy.  Neurological: She is alert and oriented to person, place, and time.  Skin: Skin is warm and dry. She is not diaphoretic.  Nursing note and vitals reviewed.    ED Treatments / Results  Labs (all labs ordered are listed, but only abnormal results are displayed) Labs Reviewed - No data to display  EKG None  Radiology Dg Neck Soft Tissue  Result Date: 06/10/2018 CLINICAL DATA:  57 year-old female c/o difficulty swallowing x 1 week. Pt states that "things get stuck" Pt also c/o RIGHT clavicle pain and swelling. Pt states she fell approx a week ago and caught herself with her RIGHT arm. EXAM: NECK SOFT TISSUES - 1+ VIEW COMPARISON:  None. FINDINGS: There is no evidence of retropharyngeal soft tissue swelling or epiglottic enlargement. The cervical airway is unremarkable and no radio-opaque foreign body identified. IMPRESSION:  Negative. Electronically Signed   By: Amie Portland M.D.   On: 06/10/2018 11:51   Dg Chest 2 View  Result Date: 06/10/2018 CLINICAL DATA:  57 year-old female c/o difficulty swallowing x 1 week. Pt states that "things get stuck" Pt also c/o RIGHT clavicle pain and swelling. Pt states she fell approx a week ago and caught herself with her RIGHT arm. EXAM: CHEST -  2 VIEW COMPARISON:  05/12/2017 FINDINGS: Cardiac silhouette is normal in size. Normal mediastinal and hilar contours. Lungs are clear.  No pleural effusion or pneumothorax. Skeletal structures are intact. IMPRESSION: No active cardiopulmonary disease. Electronically Signed   By: Amie Portland M.D.   On: 06/10/2018 11:51    Procedures Procedures (including critical care time)  Medications Ordered in ED Medications  acetaminophen (TYLENOL) tablet 650 mg (650 mg Oral Given 06/10/18 1106)     Initial Impression / Assessment and Plan / ED Course  I have reviewed the triage vital signs and the nursing notes.  Pertinent labs & imaging results that were available during my care of the patient were reviewed by me and considered in my medical decision making (see chart for details).     Patient was able to swallow Tylenol without difficulty.  She has no drooling or other concerning findings for an obstruction.  With her dysphagia I think she will need to follow-up with GI.  She has trace swelling over her medial right clavicle with some tenderness but this is of unclear etiology.  X-ray of neck and chest are unremarkable.  I do not think an emergent CT would be needed.  I think next have his follow-up with GI but otherwise return if any symptoms were to worsen before then.  Final Clinical Impressions(s) / ED Diagnoses   Final diagnoses:  Dysphagia, unspecified type    ED Discharge Orders    None       Pricilla Loveless, MD 06/10/18 1313

## 2018-06-10 NOTE — ED Triage Notes (Signed)
Pt reports pain to the front of her neck and pain with swallowing for the past week. She states the food gets stuck sometimes.

## 2018-06-10 NOTE — Discharge Instructions (Addendum)
If you develop drooling, inability to swallow, vomiting, shortness of breath, or trouble speaking/talking or any other new or concerning symptoms and return to the ER for evaluation.  Otherwise follow-up with your gastroenterologist for further evaluation.

## 2018-06-10 NOTE — ED Notes (Signed)
Patient transported to X-ray 

## 2018-06-26 ENCOUNTER — Other Ambulatory Visit: Payer: Self-pay | Admitting: Family Medicine

## 2018-06-26 DIAGNOSIS — Z1231 Encounter for screening mammogram for malignant neoplasm of breast: Secondary | ICD-10-CM

## 2018-07-18 ENCOUNTER — Ambulatory Visit: Payer: Medicare Other

## 2018-10-02 ENCOUNTER — Other Ambulatory Visit: Payer: Self-pay

## 2018-10-02 ENCOUNTER — Emergency Department (HOSPITAL_BASED_OUTPATIENT_CLINIC_OR_DEPARTMENT_OTHER): Payer: Medicare Other

## 2018-10-02 ENCOUNTER — Encounter (HOSPITAL_BASED_OUTPATIENT_CLINIC_OR_DEPARTMENT_OTHER): Payer: Self-pay | Admitting: Emergency Medicine

## 2018-10-02 ENCOUNTER — Emergency Department (HOSPITAL_BASED_OUTPATIENT_CLINIC_OR_DEPARTMENT_OTHER)
Admission: EM | Admit: 2018-10-02 | Discharge: 2018-10-02 | Disposition: A | Discharge status: Home / Self Care | Payer: Medicare Other | Attending: Emergency Medicine | Admitting: Emergency Medicine

## 2018-10-02 DIAGNOSIS — R1084 Generalized abdominal pain: Secondary | ICD-10-CM | POA: Diagnosis present

## 2018-10-02 DIAGNOSIS — Z79899 Other long term (current) drug therapy: Secondary | ICD-10-CM | POA: Diagnosis not present

## 2018-10-02 DIAGNOSIS — F17210 Nicotine dependence, cigarettes, uncomplicated: Secondary | ICD-10-CM | POA: Diagnosis not present

## 2018-10-02 DIAGNOSIS — I10 Essential (primary) hypertension: Secondary | ICD-10-CM | POA: Insufficient documentation

## 2018-10-02 DIAGNOSIS — R109 Unspecified abdominal pain: Secondary | ICD-10-CM

## 2018-10-02 LAB — CBC WITH DIFFERENTIAL/PLATELET
Abs Immature Granulocytes: 0.01 10*3/uL (ref 0.00–0.07)
Basophils Absolute: 0.1 10*3/uL (ref 0.0–0.1)
Basophils Relative: 1 %
Eosinophils Absolute: 0.4 10*3/uL (ref 0.0–0.5)
Eosinophils Relative: 5 %
HCT: 46.1 % — ABNORMAL HIGH (ref 36.0–46.0)
Hemoglobin: 15.1 g/dL — ABNORMAL HIGH (ref 12.0–15.0)
Immature Granulocytes: 0 %
Lymphocytes Relative: 32 %
Lymphs Abs: 2.4 10*3/uL (ref 0.7–4.0)
MCH: 30.4 pg (ref 26.0–34.0)
MCHC: 32.8 g/dL (ref 30.0–36.0)
MCV: 92.8 fL (ref 80.0–100.0)
Monocytes Absolute: 0.4 10*3/uL (ref 0.1–1.0)
Monocytes Relative: 5 %
Neutro Abs: 4.3 10*3/uL (ref 1.7–7.7)
Neutrophils Relative %: 57 %
Platelets: 277 10*3/uL (ref 150–400)
RBC: 4.97 MIL/uL (ref 3.87–5.11)
RDW: 11.9 % (ref 11.5–15.5)
WBC: 7.6 10*3/uL (ref 4.0–10.5)
nRBC: 0 % (ref 0.0–0.2)

## 2018-10-02 LAB — URINALYSIS, ROUTINE W REFLEX MICROSCOPIC
BILIRUBIN URINE: NEGATIVE
Glucose, UA: NEGATIVE mg/dL
Hgb urine dipstick: NEGATIVE
KETONES UR: NEGATIVE mg/dL
Leukocytes, UA: NEGATIVE
NITRITE: NEGATIVE
PH: 6 (ref 5.0–8.0)
Protein, ur: NEGATIVE mg/dL
Specific Gravity, Urine: 1.025 (ref 1.005–1.030)

## 2018-10-02 LAB — COMPREHENSIVE METABOLIC PANEL
ALT: 17 U/L (ref 0–44)
AST: 17 U/L (ref 15–41)
Albumin: 4.3 g/dL (ref 3.5–5.0)
Alkaline Phosphatase: 62 U/L (ref 38–126)
Anion gap: 10 (ref 5–15)
BUN: 16 mg/dL (ref 6–20)
CO2: 28 mmol/L (ref 22–32)
Calcium: 9.5 mg/dL (ref 8.9–10.3)
Chloride: 101 mmol/L (ref 98–111)
Creatinine, Ser: 0.73 mg/dL (ref 0.44–1.00)
GFR calc Af Amer: >60 mL/min (ref >60)
GFR calc non Af Amer: >60 mL/min (ref >60)
Glucose, Bld: 105 mg/dL — ABNORMAL HIGH (ref 70–99)
Potassium: 3.6 mmol/L (ref 3.5–5.1)
Sodium: 139 mmol/L (ref 135–145)
Total Bilirubin: 0.1 mg/dL — ABNORMAL LOW (ref 0.3–1.2)
Total Protein: 7 g/dL (ref 6.5–8.1)

## 2018-10-02 LAB — LIPASE, BLOOD: Lipase: 21 U/L (ref 11–51)

## 2018-10-02 LAB — I-STAT CG4 LACTIC ACID, ED: Lactic Acid, Venous: 0.83 mmol/L (ref 0.5–1.9)

## 2018-10-02 MED ORDER — MORPHINE SULFATE (PF) 4 MG/ML IV SOLN
4.0000 mg | Freq: Once | INTRAVENOUS | Status: AC
  Administered 2018-10-02: 4 mg via INTRAVENOUS
  Filled 2018-10-02: qty 1

## 2018-10-02 MED ORDER — TRAMADOL HCL 50 MG PO TABS: 50.0000 mg | ORAL_TABLET | Freq: Four times a day (QID) | ORAL | 0 refills | Status: DC | PRN

## 2018-10-02 MED ORDER — ONDANSETRON HCL 4 MG/2ML IJ SOLN
4.0000 mg | Freq: Once | INTRAMUSCULAR | Status: AC
  Administered 2018-10-02: 4 mg via INTRAVENOUS
  Filled 2018-10-02: qty 2

## 2018-10-02 MED ORDER — KETOROLAC TROMETHAMINE 30 MG/ML IJ SOLN
30.0000 mg | Freq: Once | INTRAMUSCULAR | Status: AC
  Administered 2018-10-02: 30 mg via INTRAVENOUS
  Filled 2018-10-02: qty 1

## 2018-10-02 MED ORDER — SODIUM CHLORIDE 0.9 % IV BOLUS
1000.0000 mL | Freq: Once | INTRAVENOUS | Status: AC
  Administered 2018-10-02: 1000 mL via INTRAVENOUS

## 2018-10-02 MED ORDER — PROMETHAZINE HCL 25 MG PO TABS: 25.0000 mg | ORAL_TABLET | Freq: Four times a day (QID) | ORAL | 0 refills | Status: DC | PRN

## 2018-10-02 MED ORDER — IOPAMIDOL (ISOVUE-300) INJECTION 61%
100.0000 mL | Freq: Once | INTRAVENOUS | Status: AC | PRN
  Administered 2018-10-02: 100 mL via INTRAVENOUS

## 2018-10-02 NOTE — ED Triage Notes (Signed)
Reports bilateral flank pain x 1 week without dysuria.  Reports nausea.

## 2018-10-02 NOTE — ED Notes (Signed)
ED Provider at bedside. 

## 2018-10-02 NOTE — ED Notes (Signed)
Pt states took 6 tylenol tabs throughout the day without pain relief

## 2018-10-02 NOTE — ED Notes (Signed)
Patient transported to CT 

## 2018-10-02 NOTE — ED Provider Notes (Signed)
[facility] EMERGENCY DEPARTMENT Provider Note   CSN: [id number] Arrival date & time: 10/02/18  1531     History   Chief Complaint Chief Complaint  Patient presents with  . Flank Pain    HPI Heather [name] is a 57 y.o. female.  HPI Patient presents to the emergency department with bilateral flank pain over the last week without any other symptoms.  The patient states she has had some nausea but no vomiting.  Patient states that she has not had any fevers.  Patient states that she has been urinating more frequently.  She did not take any medications prior to arrival for her symptoms.  The patient denies chest pain, shortness of breath, headache,blurred vision, neck pain, fever, cough, weakness, numbness, dizziness, anorexia, edema,  vomiting, diarrhea, rash, back pain, dysuria, hematemesis, bloody stool, near syncope, or syncope. Past Medical History:  Diagnosis Date  . Barrett's esophageal ulceration   . Chronic back pain   . GI bleed   . Hypertension   . Pancreatic lesion     Patient Active Problem List   Diagnosis Date Noted  . GI bleed 02/25/2012  . Black stool 02/25/2012  . Nausea & vomiting 02/25/2012  . Diarrhea 02/25/2012  . SBO (small bowel obstruction) (HCC) 02/25/2012  . Barrett esophagus 02/25/2012  . Leukocytosis 02/25/2012  . Hyponatremia 02/25/2012  . Anemia 02/25/2012    Past Surgical History:  Procedure Laterality Date  . ABDOMINAL HYSTERECTOMY  2003   with Left Salpingo  . ABDOMINAL SURGERY     at age 89 for malrotation; appendectomy  . BACK SURGERY    . CESAREAN SECTION    . ESOPHAGOGASTRODUODENOSCOPY  02/26/2012   Procedure: ESOPHAGOGASTRODUODENOSCOPY (EGD);  Surgeon: Petra Kuba, MD;  Location: [facility];  Service: Endoscopy;  Laterality: N/A;  . LAPAROSCOPIC INCISIONAL / UMBILICAL / VENTRAL HERNIA REPAIR  2008  . LAPAROSCOPIC NISSEN FUNDOPLICATION  2000  . VENTRAL HERNIA REPAIR  2003     OB History    Gravida  6   Para    2   Term  2   Preterm      AB  4   Living        SAB  4   TAB      Ectopic      Multiple      Live Births               Home Medications    Prior to Admission medications   Medication Sig Start Date End Date Taking? Authorizing Provider  acetaminophen (TYLENOL) 325 MG tablet Take 650 mg by mouth every 6 (six) hours as needed.    [provider]  cyclobenzaprine (FLEXERIL) 10 MG tablet Take 1 tablet (10 mg total) by mouth 3 (three) times daily as needed for muscle spasms. 05/12/17   Devoria Albe, MD  ibuprofen (ADVIL,MOTRIN) 800 MG tablet Take 800 mg by mouth every 8 (eight) hours as needed.    [provider]  omeprazole (PRILOSEC) 20 MG capsule Take 1 po BID x 2 weeks then once a day 05/12/17   Devoria Albe, MD  predniSONE (DELTASONE) 20 MG tablet Take 3 po QD x 3d , then 2 po QD x 3d then 1 po QD x 3d 05/12/17   Devoria Albe, MD  clonazePAM (KLONOPIN) 0.5 MG tablet Take 0.5 mg by mouth as needed.    02/25/12  [provider]    Family History Family History  Problem Relation  Age of Onset  . Heart failure Mother   . Diabetes Mother   . Asthma Mother   . Heart failure Father   . Heart attack Father   . Heart failure Sister   . Diabetes Sister   . Kidney failure Sister   . Heart failure Brother     Social History Social History   Tobacco Use  . Smoking status: Current Every Day Smoker    Packs/day: 0.50    Types: Cigarettes  . Smokeless tobacco: Never Used  Substance Use Topics  . Alcohol use: No  . Drug use: No     Allergies   Penicillins   Review of Systems Review of Systems All other systems negative except as documented in the HPI. All pertinent positives and negatives as reviewed in the HPI.  Physical Exam Updated Vital Signs BP (!) 151/81 (BP Location: Left Arm)   Pulse 83   Temp 98 F (36.7 C)   Resp 18   Ht 5\' 5"  (1.651 m)   Wt 68 kg   SpO2 99%   BMI 24.96 kg/m   Physical Exam  Constitutional: She is  oriented to person, place, and time. She appears well-developed and well-nourished. No distress.  HENT:  Head: Normocephalic and atraumatic.  Mouth/Throat: Oropharynx is clear and moist.  Eyes: Pupils are equal, round, and reactive to light.  Neck: Normal range of motion. Neck supple.  Cardiovascular: Normal rate, regular rhythm and normal heart sounds. Exam reveals no gallop and no friction rub.  No murmur heard. Pulmonary/Chest: Effort normal and breath sounds normal. No respiratory distress. She has no wheezes.  Abdominal: Soft. Bowel sounds are normal. She exhibits no distension and no mass. There is tenderness. There is no rebound and no guarding.  Neurological: She is alert and oriented to person, place, and time. She exhibits normal muscle tone. Coordination normal.  Skin: Skin is warm and dry. Capillary refill takes less than 2 seconds. No rash noted. No erythema.  Psychiatric: She has a normal mood and affect. Her behavior is normal.  Nursing note and vitals reviewed.    ED Treatments / Results  Labs (all labs ordered are listed, but only abnormal results are displayed) Labs Reviewed  COMPREHENSIVE METABOLIC PANEL - Abnormal; Notable for the following components:      Result Value   Glucose, Bld 105 (*)    Total Bilirubin 0.1 (*)    All other components within normal limits  CBC WITH DIFFERENTIAL/PLATELET - Abnormal; Notable for the following components:   Hemoglobin 15.1 (*)    HCT 46.1 (*)    All other components within normal limits  URINALYSIS, ROUTINE W REFLEX MICROSCOPIC  LIPASE, BLOOD  I-STAT CG4 LACTIC ACID, ED    EKG None  Radiology Ct Abdomen Pelvis W Contrast  Result Date: 10/02/2018 CLINICAL DATA:  Bilateral flank pain x1 week with dysuria. History hernia repair, hysterectomy and appendectomy. EXAM: CT ABDOMEN AND PELVIS WITH CONTRAST TECHNIQUE: Multidetector CT imaging of the abdomen and pelvis was performed using the standard protocol following bolus  administration of intravenous contrast. CONTRAST:  ISOVUE-300 IOPAMIDOL (ISOVUE-300) INJECTION 61% COMPARISON:  08/18/2016 FINDINGS: Lower chest: Top-normal heart size. Dependent bibasilar atelectasis. Hepatobiliary: Homogeneous appearance of the liver without space-occupying mass. The gallbladder is unremarkable free of stones. No biliary dilatation. Pancreas: Stable common bile and pancreatic ductal dilatation/ectasia. No choledocholithiasis. No definite pancreatic mass. Spleen: No splenomegaly or mass. Adrenals/Urinary Tract: Normal bilateral adrenal glands. No nephrolithiasis. Water attenuating cyst off the lower  pole the left kidney measuring 1 cm, stable in appearance. No hydroureteronephrosis. The urinary bladder is normal. 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 Moderate stool retention within the distal colon. No bowel obstruction or inflammation. History of appendectomy. Vascular/Lymphatic: Mild atherosclerosis of the abdominal aorta and common iliac arteries. Patent branch vessels. Lymphadenopathy. Reproductive: Status post hysterectomy. No adnexal masses. Other: No abdominal wall hernia or abnormality. No abdominopelvic ascites. Musculoskeletal: No acute osseous abnormality. Posterior lumbar fusion with spinal decompression L5-S1. Degenerative disc disease and L2-3. IMPRESSION: No nephrolithiasis nor obstructive uropathy. Stable cyst off the lower pole the left kidney measuring approximately 1 cm. Bowel malrotation redemonstrated. Electronically Signed   By: Tollie Eth M.D.   On: 10/02/2018 19:15    Procedures Procedures (including critical care time)  Medications Ordered in ED Medications  sodium chloride 0.9 % bolus 1,000 mL ( Intravenous Stopped 10/02/18 1740)  morphine 4 MG/ML injection 4 mg (4 mg Intravenous Given 10/02/18 1756)  ondansetron (ZOFRAN) injection 4 mg (4 mg Intravenous Given 10/02/18  1756)  iopamidol (ISOVUE-300) 61 % injection 100 mL (100 mLs Intravenous Contrast Given 10/02/18 1847)  ketorolac (TORADOL) 30 MG/ML injection 30 mg (30 mg Intravenous Given 10/02/18 2021)     Initial Impression / Assessment and Plan / ED Course  I have reviewed the triage vital signs and the nursing notes.  Pertinent labs & imaging results that were available during my care of the patient were reviewed by me and considered in my medical decision making (see chart for details).    Patient CT scan did not show any significant abnormality at this time.  I did advise her that her laboratory testing also did not show any significant abnormalities.  I did advise her should need to follow-up with her primary care doctor.  Also advised that this could be an evolving process that should to fully declare itself and that she would need to return for any worsening in her condition.  Patient agrees this plan and all questions were answered.  Final Clinical Impressions(s) / ED Diagnoses   Final diagnoses:  None    ED Discharge Orders    None       Kyra Manges 10/02/18 2030    Tilden Fossa, MD 10/03/18 1209

## 2018-10-02 NOTE — Discharge Instructions (Addendum)
Return here as needed.  Follow-up with your primary doctor soon as possible.  Increase your fluid intake and rest as much as possible

## 2018-11-30 ENCOUNTER — Emergency Department (HOSPITAL_BASED_OUTPATIENT_CLINIC_OR_DEPARTMENT_OTHER): Payer: Medicare Other

## 2018-11-30 ENCOUNTER — Emergency Department (HOSPITAL_BASED_OUTPATIENT_CLINIC_OR_DEPARTMENT_OTHER)
Admission: EM | Admit: 2018-11-30 | Discharge: 2018-11-30 | Disposition: A | Discharge status: Home / Self Care | Payer: Medicare Other | Attending: Emergency Medicine | Admitting: Emergency Medicine

## 2018-11-30 ENCOUNTER — Encounter (HOSPITAL_BASED_OUTPATIENT_CLINIC_OR_DEPARTMENT_OTHER): Payer: Self-pay | Admitting: *Deleted

## 2018-11-30 ENCOUNTER — Other Ambulatory Visit: Payer: Self-pay

## 2018-11-30 DIAGNOSIS — G8929 Other chronic pain: Secondary | ICD-10-CM | POA: Insufficient documentation

## 2018-11-30 DIAGNOSIS — R109 Unspecified abdominal pain: Secondary | ICD-10-CM | POA: Insufficient documentation

## 2018-11-30 DIAGNOSIS — R112 Nausea with vomiting, unspecified: Secondary | ICD-10-CM | POA: Diagnosis not present

## 2018-11-30 DIAGNOSIS — Z79899 Other long term (current) drug therapy: Secondary | ICD-10-CM | POA: Diagnosis not present

## 2018-11-30 DIAGNOSIS — R197 Diarrhea, unspecified: Secondary | ICD-10-CM | POA: Diagnosis not present

## 2018-11-30 DIAGNOSIS — I10 Essential (primary) hypertension: Secondary | ICD-10-CM | POA: Insufficient documentation

## 2018-11-30 DIAGNOSIS — F17210 Nicotine dependence, cigarettes, uncomplicated: Secondary | ICD-10-CM | POA: Diagnosis not present

## 2018-11-30 LAB — COMPREHENSIVE METABOLIC PANEL
ALT: 13 U/L (ref 0–44)
AST: 14 U/L — ABNORMAL LOW (ref 15–41)
Albumin: 4.2 g/dL (ref 3.5–5.0)
Alkaline Phosphatase: 62 U/L (ref 38–126)
Anion gap: 10 (ref 5–15)
BUN: 12 mg/dL (ref 6–20)
CO2: 23 mmol/L (ref 22–32)
Calcium: 9.8 mg/dL (ref 8.9–10.3)
Chloride: 102 mmol/L (ref 98–111)
Creatinine, Ser: 0.68 mg/dL (ref 0.44–1.00)
GFR calc non Af Amer: >60 mL/min (ref >60)
Glucose, Bld: 124 mg/dL — ABNORMAL HIGH (ref 70–99)
Potassium: 3.4 mmol/L — ABNORMAL LOW (ref 3.5–5.1)
Sodium: 135 mmol/L (ref 135–145)
Total Bilirubin: 0.5 mg/dL (ref 0.3–1.2)
Total Protein: 7.2 g/dL (ref 6.5–8.1)

## 2018-11-30 LAB — URINALYSIS, ROUTINE W REFLEX MICROSCOPIC
Bilirubin Urine: NEGATIVE
Glucose, UA: NEGATIVE mg/dL
Ketones, ur: NEGATIVE mg/dL
LEUKOCYTES UA: NEGATIVE
Nitrite: NEGATIVE
Protein, ur: NEGATIVE mg/dL
Specific Gravity, Urine: <1.005 — ABNORMAL LOW (ref 1.005–1.030)
pH: 6 (ref 5.0–8.0)

## 2018-11-30 LAB — LIPASE, BLOOD: Lipase: 30 U/L (ref 11–51)

## 2018-11-30 LAB — URINALYSIS, MICROSCOPIC (REFLEX)
Bacteria, UA: NONE SEEN
RBC / HPF: NONE SEEN RBC/hpf (ref 0–5)
WBC, UA: NONE SEEN WBC/hpf (ref 0–5)

## 2018-11-30 LAB — CBC
HCT: 48.5 % — ABNORMAL HIGH (ref 36.0–46.0)
Hemoglobin: 16.6 g/dL — ABNORMAL HIGH (ref 12.0–15.0)
MCH: 30.3 pg (ref 26.0–34.0)
MCHC: 34.2 g/dL (ref 30.0–36.0)
MCV: 88.5 fL (ref 80.0–100.0)
NRBC: 0 % (ref 0.0–0.2)
Platelets: 344 10*3/uL (ref 150–400)
RBC: 5.48 MIL/uL — ABNORMAL HIGH (ref 3.87–5.11)
RDW: 11.6 % (ref 11.5–15.5)
WBC: 10.6 10*3/uL — AB (ref 4.0–10.5)

## 2018-11-30 MED ORDER — METOCLOPRAMIDE HCL 5 MG/ML IJ SOLN
INTRAMUSCULAR | Status: AC
  Filled 2018-11-30: qty 2

## 2018-11-30 MED ORDER — MORPHINE SULFATE (PF) 4 MG/ML IV SOLN
4.0000 mg | Freq: Once | INTRAVENOUS | Status: AC
  Administered 2018-11-30: 4 mg via INTRAVENOUS
  Filled 2018-11-30: qty 1

## 2018-11-30 MED ORDER — ONDANSETRON 4 MG PO TBDP: 4.0000 mg | ORAL_TABLET | Freq: Three times a day (TID) | ORAL | 0 refills | Status: DC | PRN

## 2018-11-30 MED ORDER — IOPAMIDOL (ISOVUE-300) INJECTION 61%
100.0000 mL | Freq: Once | INTRAVENOUS | Status: AC | PRN
  Administered 2018-11-30: 100 mL via INTRAVENOUS

## 2018-11-30 MED ORDER — SODIUM CHLORIDE 0.9 % IV BOLUS
1000.0000 mL | Freq: Once | INTRAVENOUS | Status: AC
  Administered 2018-11-30: 1000 mL via INTRAVENOUS

## 2018-11-30 MED ORDER — METOCLOPRAMIDE HCL 5 MG/ML IJ SOLN
20.0000 mg | Freq: Once | INTRAVENOUS | Status: AC
  Administered 2018-11-30: 20 mg via INTRAVENOUS
  Filled 2018-11-30: qty 4

## 2018-11-30 MED ORDER — ONDANSETRON HCL 4 MG/2ML IJ SOLN
4.0000 mg | Freq: Once | INTRAMUSCULAR | Status: AC | PRN
  Administered 2018-11-30: 4 mg via INTRAVENOUS
  Filled 2018-11-30: qty 2

## 2018-11-30 NOTE — Discharge Instructions (Addendum)
You were seen in the ER for abdominal pain.  Lab work today is reassuring.  CT scan does not show any new surgeries in your abdomen or pelvis.  The cause of your pain is unclear but it may be from inflammation in your stomach or your esophagus.  Continue taking Prilosec as prescribed.  Avoid ibuprofen containing products, alcohol, aspirin.  It is really important for you to follow-up with GI for routine follow-up and long-term management of your symptoms.  Return to the ER for fevers, blood or black vomit, black stools, worsening or constant pain.

## 2018-11-30 NOTE — ED Notes (Signed)
Pt understood dc material. NAD noted. Scripts sent electronically. 

## 2018-11-30 NOTE — ED Notes (Signed)
ED Provider at bedside. 

## 2018-11-30 NOTE — ED Provider Notes (Signed)
[facility] EMERGENCY DEPARTMENT Provider Note   CSN: [id number] Arrival date & time: 11/30/18  1558     History   Chief Complaint Chief Complaint  Patient presents with  . Abdominal Pain    HPI Heather [name] is a 57 y.o. female with history of ulcers on Prilosec, GI bleed, acute blood loss anemia, dysphasia, colon malrotation status post surgical repair, Nissen fundoplication, s/p appendectomy, Barrett's esophagus, pancreatic duct dilation, is here for evaluation of abdominal pain.  Reports ongoing chronic abdominal pain for more than 2 months but acutely worsening in the last 3 days.  Described as sharp, like a band wrapping around her top upper abdomen most significantly at the epigastrium.  Pain is intermittent.  8/10.  She has taken Tylenol without relief.  Associated with nausea and 2 episodes of dark brown emesis.  Also has noticed intermittent upper abdomen bloating especially worse after eating, fatigue.  She was seen in the ER 2 months ago for similar symptoms and had a reassuring work-up and CT scan.  States since then her symptoms have been intermittent but not getting any better.  She has not followed up with general surgery or GI as recommended to her at last admission 2013 due to insurance and transportation issues. Denies NSAID use, EtOH use.  She denies any fevers, hematemesis, dysuria, hematuria, known sick contacts.     HPI  Past Medical History:  Diagnosis Date  . Barrett's esophageal ulceration   . Chronic back pain   . GI bleed   . Hypertension   . Pancreatic lesion     Patient Active Problem List   Diagnosis Date Noted  . GI bleed 02/25/2012  . Black stool 02/25/2012  . Nausea & vomiting 02/25/2012  . Diarrhea 02/25/2012  . SBO (small bowel obstruction) (HCC) 02/25/2012  . Barrett esophagus 02/25/2012  . Leukocytosis 02/25/2012  . Hyponatremia 02/25/2012  . Anemia 02/25/2012    Past Surgical History:  Procedure Laterality Date  .  ABDOMINAL HYSTERECTOMY  2003   with Left Salpingo  . ABDOMINAL SURGERY     at age 74 for malrotation; appendectomy  . BACK SURGERY    . CESAREAN SECTION    . ESOPHAGOGASTRODUODENOSCOPY  02/26/2012   Procedure: ESOPHAGOGASTRODUODENOSCOPY (EGD);  Surgeon: Petra Kuba, MD;  Location: [facility];  Service: Endoscopy;  Laterality: N/A;  . LAPAROSCOPIC INCISIONAL / UMBILICAL / VENTRAL HERNIA REPAIR  2008  . LAPAROSCOPIC NISSEN FUNDOPLICATION  2000  . VENTRAL HERNIA REPAIR  2003     OB History    Gravida  6   Para  2   Term  2   Preterm      AB  4   Living        SAB  4   TAB      Ectopic      Multiple      Live Births               Home Medications    Prior to Admission medications   Medication Sig Start Date End Date Taking? Authorizing Provider  acetaminophen (TYLENOL) 325 MG tablet Take 650 mg by mouth every 6 (six) hours as needed.    [provider]  cyclobenzaprine (FLEXERIL) 10 MG tablet Take 1 tablet (10 mg total) by mouth 3 (three) times daily as needed for muscle spasms. 05/12/17   Devoria Albe, MD  ibuprofen (ADVIL,MOTRIN) 800 MG tablet Take 800 mg by mouth every 8 (eight) hours as needed.  [provider]  omeprazole (PRILOSEC) 20 MG capsule Take 1 po BID x 2 weeks then once a day 05/12/17   Devoria Albe, MD  ondansetron (ZOFRAN ODT) 4 MG disintegrating tablet Take 1 tablet (4 mg total) by mouth every 8 (eight) hours as needed for nausea or vomiting. 11/30/18   Liberty Handy, PA-C  predniSONE (DELTASONE) 20 MG tablet Take 3 po QD x 3d , then 2 po QD x 3d then 1 po QD x 3d 05/12/17   Devoria Albe, MD  promethazine (PHENERGAN) 25 MG tablet Take 1 tablet (25 mg total) by mouth every 6 (six) hours as needed for nausea or vomiting. 10/02/18   Lawyer, Cristal Deer, PA-C  traMADol (ULTRAM) 50 MG tablet Take 1 tablet (50 mg total) by mouth every 6 (six) hours as needed for severe pain. 10/02/18   Lawyer, Cristal Deer, PA-C  clonazePAM (KLONOPIN)  0.5 MG tablet Take 0.5 mg by mouth as needed.    02/25/12  [provider]    Family History Family History  Problem Relation Age of Onset  . Heart failure Mother   . Diabetes Mother   . Asthma Mother   . Heart failure Father   . Heart attack Father   . Heart failure Sister   . Diabetes Sister   . Kidney failure Sister   . Heart failure Brother     Social History Social History   Tobacco Use  . Smoking status: Current Every Day Smoker    Packs/day: 0.50    Types: Cigarettes  . Smokeless tobacco: Never Used  Substance Use Topics  . Alcohol use: No  . Drug use: No     Allergies   Penicillins   Review of Systems Review of Systems  Gastrointestinal: Positive for abdominal pain, diarrhea, nausea and vomiting.  All other systems reviewed and are negative.    Physical Exam Updated Vital Signs BP (!) 143/98   Pulse (!) 118   Temp 98.9 F (37.2 C) (Oral)   Resp 20   Ht 5\' 5"  (1.651 m)   Wt 65.8 kg   SpO2 97%   BMI 24.13 kg/m   Physical Exam Vitals signs and nursing note reviewed.  Constitutional:      Appearance: She is well-developed.     Comments: Non toxic  HENT:     Head: Normocephalic and atraumatic.     Nose: Nose normal.  Eyes:     Conjunctiva/sclera: Conjunctivae normal.     Pupils: Pupils are equal, round, and reactive to light.  Neck:     Musculoskeletal: Normal range of motion.  Cardiovascular:     Rate and Rhythm: Normal rate and regular rhythm.  Pulmonary:     Effort: Pulmonary effort is normal.     Breath sounds: Normal breath sounds.  Abdominal:     General: Bowel sounds are normal.     Palpations: Abdomen is soft.     Tenderness: There is abdominal tenderness in the epigastric area.     Comments: Focal, moderate epigastric tenderness.  No rigidity or rebound or guarding.  Active bowel sounds to lower quadrants.  Negative Murphy's.  No suprapubic or CVA tenderness.  No obvious distention.  Surgical scars noted.    Musculoskeletal: Normal range of motion.  Skin:    General: Skin is warm and dry.     Capillary Refill: Capillary refill takes less than 2 seconds.  Neurological:     Mental Status: She is alert and oriented to person, place, and  time.  Psychiatric:        Behavior: Behavior normal.      ED Treatments / Results  Labs (all labs ordered are listed, but only abnormal results are displayed) Labs Reviewed  COMPREHENSIVE METABOLIC PANEL - Abnormal; Notable for the following components:      Result Value   Potassium 3.4 (*)    Glucose, Bld 124 (*)    AST 14 (*)    All other components within normal limits  CBC - Abnormal; Notable for the following components:   WBC 10.6 (*)    RBC 5.48 (*)    Hemoglobin 16.6 (*)    HCT 48.5 (*)    All other components within normal limits  URINALYSIS, ROUTINE W REFLEX MICROSCOPIC - Abnormal; Notable for the following components:   Specific Gravity, Urine <1.005 (*)    Hgb urine dipstick TRACE (*)    All other components within normal limits  LIPASE, BLOOD  URINALYSIS, MICROSCOPIC (REFLEX)    EKG None  Radiology Ct Abdomen Pelvis W Contrast  Result Date: 11/30/2018 CLINICAL DATA:  Nausea for 4 days. EXAM: CT ABDOMEN AND PELVIS WITH CONTRAST TECHNIQUE: Multidetector CT imaging of the abdomen and pelvis was performed using the standard protocol following bolus administration of intravenous contrast. CONTRAST:  100 mL ISOVUE-300 IOPAMIDOL (ISOVUE-300) INJECTION 61% COMPARISON:  CT abdomen and pelvis 10/02/2018 and 08/18/2016. FINDINGS: Lower chest: Heart size is upper normal. Minimal bibasilar atelectasis is noted. No pleural or pericardial effusion. Hepatobiliary: No focal liver abnormality is seen. No gallstones, gallbladder wall thickening, or biliary dilatation. Pancreas: Unremarkable. No pancreatic ductal dilatation or surrounding inflammatory changes. Spleen: Normal in size without focal abnormality. Adrenals/Urinary Tract: Adrenal glands are  unremarkable. Kidneys are normal, without renal calculi or hydronephrosis. Small cyst left kidney is incidentally noted and unchanged. Bladder is unremarkable. Stomach/Bowel: No acute abnormality is seen. The patient is status post appendectomy. Small bowel malrotation is noted with the colon in the central and left abdomen and small bowel loops in the right abdomen and pelvis. Vascular/Lymphatic: Aortic atherosclerosis. No enlarged abdominal or pelvic lymph nodes. Reproductive: Status post hysterectomy. No adnexal masses. Other: Mesh from prior ventral hernia repair noted. Musculoskeletal: No acute or focal abnormality. There is convex right scoliosis. The patient is status post L5-S1 fusion. IMPRESSION: No acute abnormality abdomen or pelvis. No finding to explain the patient's symptoms. Atherosclerosis. Electronically Signed   By: Drusilla Kannerhomas  Dalessio M.D.   On: 11/30/2018 18:56    Procedures Procedures (including critical care time)  Medications Ordered in ED Medications  metoCLOPramide (REGLAN) 5 MG/ML injection (has no administration in time range)  metoCLOPramide (REGLAN) 5 MG/ML injection (has no administration in time range)  ondansetron (ZOFRAN) injection 4 mg (4 mg Intravenous Given 11/30/18 1710)  sodium chloride 0.9 % bolus 1,000 mL (0 mLs Intravenous Stopped 11/30/18 1815)  morphine 4 MG/ML injection 4 mg (4 mg Intravenous Given 11/30/18 1710)  metoCLOPramide (REGLAN) 20 mg in dextrose 5 % 50 mL IVPB ( Intravenous Stopped 11/30/18 1850)  iopamidol (ISOVUE-300) 61 % injection 100 mL (100 mLs Intravenous Contrast Given 11/30/18 1819)     Initial Impression / Assessment and Plan / ED Course  I have reviewed the triage vital signs and the nursing notes.  Pertinent labs & imaging results that were available during my care of the patient were reviewed by me and considered in my medical decision making (see chart for details).  Clinical Course as of Dec 01 1907  Fri Nov 30, 2018  1728  WBC(!): 10.6 [CG]    Clinical Course User Index [CG] Liberty Handy, PA-C   Acute on chronic abdominal pain.  No peritonitis on exam, focal epigastric tenderness.Highest on ddx is viral process vs pancreatitis.  Given extensive surgical history to the abdomen also considering obstruction.  Negative Murphy's. S/p appendectomy.  She has no urinary symptoms, suprapubic or CVA tenderness and I doubt renal stone/pyelonephritis/UTI.  She has history of ulcers but has been compliant with PPI.  We will obtain screening labs, give medicines and reassess.   1833: Repeat abdominal exam without significant improvement.  She has moderate epigastric tenderness.  She is still nauseated.  Minimal leukocytosis.  No drop in hemoglobin, although she reports intermittent dark stools I doubt GIB given symptoms ongoing for 2 months w/o changes in hgb.  LFTs, creatinine unremarkable.  No infection in her urine.  I have low suspicion for acute intra-abdominal process however she has had significant surgical history.  We will obtain CT scan today to further characterize.  1907: CT abdomen is reassuring.  Discussed results with patient.  She is reassured.  Stressed importance of follow-up with GI for long-term management of her symptoms.  They recommended repeat EGD several years ago but she has been unable to follow-up.  Return precautions were given. Final Clinical Impressions(s) / ED Diagnoses   Final diagnoses:  Chronic abdominal pain  Nausea vomiting and diarrhea    ED Discharge Orders         Ordered    ondansetron (ZOFRAN ODT) 4 MG disintegrating tablet  Every 8 hours PRN     11/30/18 1905           Jerrell Mylar 11/30/18 1908    Sabas Sous, MD 11/30/18 [phone]

## 2018-11-30 NOTE — ED Triage Notes (Signed)
Abdominal pain x 4 days. States she has a long hx of bowel problems.

## 2019-01-14 ENCOUNTER — Other Ambulatory Visit: Payer: Self-pay

## 2019-01-14 ENCOUNTER — Emergency Department (HOSPITAL_COMMUNITY): Payer: Medicare Other

## 2019-01-14 ENCOUNTER — Encounter (HOSPITAL_COMMUNITY): Payer: Self-pay | Admitting: Emergency Medicine

## 2019-01-14 ENCOUNTER — Emergency Department (HOSPITAL_COMMUNITY)
Admission: EM | Admit: 2019-01-14 | Discharge: 2019-01-14 | Disposition: A | Discharge status: Home / Self Care | Payer: Medicare Other | Attending: Emergency Medicine | Admitting: Emergency Medicine

## 2019-01-14 DIAGNOSIS — R1013 Epigastric pain: Secondary | ICD-10-CM | POA: Insufficient documentation

## 2019-01-14 DIAGNOSIS — E876 Hypokalemia: Secondary | ICD-10-CM | POA: Insufficient documentation

## 2019-01-14 DIAGNOSIS — F17210 Nicotine dependence, cigarettes, uncomplicated: Secondary | ICD-10-CM | POA: Diagnosis not present

## 2019-01-14 DIAGNOSIS — I10 Essential (primary) hypertension: Secondary | ICD-10-CM | POA: Diagnosis not present

## 2019-01-14 DIAGNOSIS — Z8719 Personal history of other diseases of the digestive system: Secondary | ICD-10-CM | POA: Diagnosis not present

## 2019-01-14 DIAGNOSIS — R140 Abdominal distension (gaseous): Secondary | ICD-10-CM | POA: Insufficient documentation

## 2019-01-14 DIAGNOSIS — R1084 Generalized abdominal pain: Secondary | ICD-10-CM

## 2019-01-14 LAB — CBC WITH DIFFERENTIAL/PLATELET
Abs Immature Granulocytes: 0.02 10*3/uL (ref 0.00–0.07)
Basophils Absolute: 0.1 10*3/uL (ref 0.0–0.1)
Basophils Relative: 1 %
Eosinophils Absolute: 0.4 10*3/uL (ref 0.0–0.5)
Eosinophils Relative: 4 %
HEMATOCRIT: 42.2 % (ref 36.0–46.0)
HEMOGLOBIN: 14.1 g/dL (ref 12.0–15.0)
Immature Granulocytes: 0 %
Lymphocytes Relative: 39 %
Lymphs Abs: 3.5 10*3/uL (ref 0.7–4.0)
MCH: 30.7 pg (ref 26.0–34.0)
MCHC: 33.4 g/dL (ref 30.0–36.0)
MCV: 91.9 fL (ref 80.0–100.0)
Monocytes Absolute: 0.4 10*3/uL (ref 0.1–1.0)
Monocytes Relative: 4 %
Neutro Abs: 4.5 10*3/uL (ref 1.7–7.7)
Neutrophils Relative %: 52 %
Platelets: 319 10*3/uL (ref 150–400)
RBC: 4.59 MIL/uL (ref 3.87–5.11)
RDW: 12.3 % (ref 11.5–15.5)
WBC: 8.8 10*3/uL (ref 4.0–10.5)
nRBC: 0 % (ref 0.0–0.2)

## 2019-01-14 LAB — COMPREHENSIVE METABOLIC PANEL
ALT: 13 U/L (ref 0–44)
AST: 15 U/L (ref 15–41)
Albumin: 3.7 g/dL (ref 3.5–5.0)
Alkaline Phosphatase: 57 U/L (ref 38–126)
Anion gap: 7 (ref 5–15)
BUN: 8 mg/dL (ref 6–20)
CO2: 26 mmol/L (ref 22–32)
Calcium: 8.8 mg/dL — ABNORMAL LOW (ref 8.9–10.3)
Chloride: 108 mmol/L (ref 98–111)
Creatinine, Ser: 0.75 mg/dL (ref 0.44–1.00)
GFR calc Af Amer: >60 mL/min (ref >60)
GFR calc non Af Amer: >60 mL/min (ref >60)
GLUCOSE: 98 mg/dL (ref 70–99)
Potassium: 3.4 mmol/L — ABNORMAL LOW (ref 3.5–5.1)
Sodium: 141 mmol/L (ref 135–145)
Total Bilirubin: 0.8 mg/dL (ref 0.3–1.2)
Total Protein: 5.9 g/dL — ABNORMAL LOW (ref 6.5–8.1)

## 2019-01-14 LAB — URINALYSIS, ROUTINE W REFLEX MICROSCOPIC
Bilirubin Urine: NEGATIVE
Glucose, UA: NEGATIVE mg/dL
Hgb urine dipstick: NEGATIVE
Ketones, ur: NEGATIVE mg/dL
Leukocytes, UA: NEGATIVE
Nitrite: NEGATIVE
Protein, ur: NEGATIVE mg/dL
Specific Gravity, Urine: 1.012 (ref 1.005–1.030)
pH: 6 (ref 5.0–8.0)

## 2019-01-14 LAB — TYPE AND SCREEN
ABO/RH(D): B POS
Antibody Screen: NEGATIVE

## 2019-01-14 LAB — POC OCCULT BLOOD, ED: FECAL OCCULT BLD: NEGATIVE

## 2019-01-14 LAB — LIPASE, BLOOD: Lipase: 40 U/L (ref 11–51)

## 2019-01-14 MED ORDER — DICYCLOMINE HCL 20 MG PO TABS: 20.0000 mg | ORAL_TABLET | Freq: Three times a day (TID) | ORAL | 0 refills | Status: DC

## 2019-01-14 MED ORDER — IOHEXOL 300 MG/ML  SOLN
100.0000 mL | Freq: Once | INTRAMUSCULAR | Status: AC | PRN
  Administered 2019-01-14: 100 mL via INTRAVENOUS

## 2019-01-14 MED ORDER — FENTANYL CITRATE (PF) 100 MCG/2ML IJ SOLN
100.0000 ug | INTRAMUSCULAR | Status: AC | PRN
  Administered 2019-01-14 (×2): 100 ug via INTRAVENOUS
  Filled 2019-01-14 (×2): qty 2

## 2019-01-14 NOTE — Discharge Instructions (Addendum)
Today your CT scan was read is not having any significant abnormalities.  On my review it does look like you may have a higher amount of gas in your abdomen.  I have given you information about the low FODMAP eating plan.  Please keep your appointment with GI.  Today you received medications that may make you sleepy or impair your ability to make decisions.  For the next 24 hours please do not drive, operate heavy machinery, care for a small child with out another adult present, or perform any activities that may cause harm to you or someone else if you were to fall asleep or be impaired.

## 2019-01-14 NOTE — ED Provider Notes (Signed)
Medical screening examination/treatment/procedure(s) were conducted as a shared visit with non-physician practitioner(s) and myself.  I personally evaluated the patient during the encounter.  None  Patient seen by me along with the physician assistant.  Patient came in from home with abdominal pain.  Also did mention that there was black stool.  Patient has extensive history of abdominal problems.  Does have follow-up coming up.  CT showed no acute findings.  Stool Hemoccult was negative.  Urine negative labs without any significant abnormalities other than a mild hypokalemia with a potassium of 3.4.  Does not require any specific intervention.  Patient stable for discharge home.  Abdomen soft and nontender.  No leukocytosis.   Vanetta Mulders, MD 01/14/19 [phone]

## 2019-01-14 NOTE — ED Notes (Signed)
Patient verbalizes understanding of discharge instructions. Opportunity for questioning and answers were provided. Armband removed by staff, pt discharged from ED.  

## 2019-01-14 NOTE — ED Triage Notes (Signed)
Per EMS pt coming from home c/o abdominal pain, distention and black stool. Patient has extensive abdominal history. Noticed dark stool last night until 11 today, none since. nausea and pain worse after eating.

## 2019-01-14 NOTE — ED Provider Notes (Signed)
[facility] EMERGENCY DEPARTMENT Provider Note   CSN: [id number] Arrival date & time: 01/14/19  1752     History   Chief Complaint Chief Complaint  Patient presents with  . Abdominal Pain    HPI Heather [name] is a 58 y.o. female with a past medical history of pancreatic lesion, GI bleed, Barrett's esophageal ulceration, small bowel obstruction, status post hysterectomy, who presents today for evaluation of abdominal pain.  She reports that she has had worsening abdominal pain since about November.  She started having black stools last night until about 11 this morning with associated diarrhea.  She states that she has a history of esophageal strictures and denies vomiting or nausea.  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  She also has mesh from a hernia repair.  She says that 3 days ago her pain got significantly worse.  She has an appointment with [facility] on Thursday however states that it was too severe to wait.  She has seen [facility] in the hospital in 2013, however has not seen them as an outpatient.   Chart review shows that she had a CT scan in 2013 showing concern for a pancreatic head lesion, however has had scans as recent as December that did not show any pancreatic lesion or other abnormality.  No fevers.  No urinary symptoms.  She denies any Pepto bismol use or iron pill use.    HPI  Past Medical History:  Diagnosis Date  . Barrett's esophageal ulceration   . Chronic back pain   . GI bleed   . Hypertension   . Pancreatic lesion     Patient Active Problem List   Diagnosis Date Noted  . GI bleed 02/25/2012  . Black stool 02/25/2012  . Nausea & vomiting 02/25/2012  . Diarrhea 02/25/2012  . SBO (small bowel obstruction) (HCC) 02/25/2012  . Barrett esophagus 02/25/2012  . Leukocytosis 02/25/2012  .  Hyponatremia 02/25/2012  . Anemia 02/25/2012    Past Surgical History:  Procedure Laterality Date  . ABDOMINAL HYSTERECTOMY  2003   with Left Salpingo  . ABDOMINAL SURGERY     at age 1 for malrotation; appendectomy  . BACK SURGERY    . CESAREAN SECTION    . ESOPHAGOGASTRODUODENOSCOPY  02/26/2012   Procedure: ESOPHAGOGASTRODUODENOSCOPY (EGD);  Surgeon: Petra Kuba, MD;  Location: [facility];  Service: Endoscopy;  Laterality: N/A;  . LAPAROSCOPIC INCISIONAL / UMBILICAL / VENTRAL HERNIA REPAIR  2008  . LAPAROSCOPIC NISSEN FUNDOPLICATION  2000  . VENTRAL HERNIA REPAIR  2003     OB History    Gravida  6   Para  2   Term  2   Preterm      AB  4   Living        SAB  4   TAB      Ectopic      Multiple      Live Births               Home Medications    Prior to Admission medications   Medication Sig Start Date End Date Taking? Authorizing Provider  acetaminophen (TYLENOL) 325 MG tablet Take 650 mg by mouth every 6 (six) hours as needed.    [provider]  cyclobenzaprine (FLEXERIL) 10 MG tablet Take 1 tablet (10 mg total) by mouth 3 (three) times daily  as needed for muscle spasms. 05/12/17   Devoria AlbeKnapp, Iva, MD  dicyclomine (BENTYL) 20 MG tablet Take 1 tablet (20 mg total) by mouth 4 (four) times daily -  before meals and at bedtime. 01/14/19   Cristina GongHammond, Carrolyn Hilmes W, PA-C  ibuprofen (ADVIL,MOTRIN) 800 MG tablet Take 800 mg by mouth every 8 (eight) hours as needed.    [provider]  omeprazole (PRILOSEC) 20 MG capsule Take 1 po BID x 2 weeks then once a day 05/12/17   Devoria AlbeKnapp, Iva, MD  ondansetron (ZOFRAN ODT) 4 MG disintegrating tablet Take 1 tablet (4 mg total) by mouth every 8 (eight) hours as needed for nausea or vomiting. 11/30/18   Liberty HandyGibbons, Claudia J, PA-C  predniSONE (DELTASONE) 20 MG tablet Take 3 po QD x 3d , then 2 po QD x 3d then 1 po QD x 3d 05/12/17   Devoria AlbeKnapp, Iva, MD  promethazine (PHENERGAN) 25 MG tablet Take 1 tablet (25 mg total) by mouth  every 6 (six) hours as needed for nausea or vomiting. 10/02/18   Lawyer, Cristal Deerhristopher, PA-C  traMADol (ULTRAM) 50 MG tablet Take 1 tablet (50 mg total) by mouth every 6 (six) hours as needed for severe pain. 10/02/18   Lawyer, Cristal Deerhristopher, PA-C  clonazePAM (KLONOPIN) 0.5 MG tablet Take 0.5 mg by mouth as needed.    02/25/12  [provider]    Family History Family History  Problem Relation Age of Onset  . Heart failure Mother   . Diabetes Mother   . Asthma Mother   . Heart failure Father   . Heart attack Father   . Heart failure Sister   . Diabetes Sister   . Kidney failure Sister   . Heart failure Brother     Social History Social History   Tobacco Use  . Smoking status: Current Every Day Smoker    Packs/day: 0.50    Types: Cigarettes  . Smokeless tobacco: Never Used  Substance Use Topics  . Alcohol use: No  . Drug use: No     Allergies   Penicillins   Review of Systems Review of Systems  Constitutional: Negative for activity change, chills and fever.  HENT: Negative for congestion.   Eyes: Negative for visual disturbance.  Respiratory: Negative for chest tightness and shortness of breath.   Cardiovascular: Negative for chest pain.  Gastrointestinal: Positive for abdominal pain and blood in stool (Dark stool). Negative for diarrhea, nausea and vomiting.  Genitourinary: Negative for dysuria, flank pain, frequency and urgency.  Musculoskeletal: Negative for back pain and neck pain.  Skin: Negative for color change and rash.  Neurological: Negative for weakness and headaches.  All other systems reviewed and are negative.    Physical Exam Updated Vital Signs BP (!) 145/88 (BP Location: Left Arm)   Pulse 87   Temp 98.7 F (37.1 C) (Oral)   Resp 18   Ht 5\' 5"  (1.651 m)   Wt 68.9 kg   SpO2 98%   BMI 25.29 kg/m   Physical Exam Vitals signs and nursing note reviewed.  Constitutional:      General: She is not in acute distress.    Appearance: She is  well-developed.  HENT:     Head: Normocephalic and atraumatic.  Eyes:     Conjunctiva/sclera: Conjunctivae normal.  Neck:     Musculoskeletal: Neck supple.  Cardiovascular:     Rate and Rhythm: Normal rate and regular rhythm.     Heart sounds: No murmur.  Pulmonary:  Effort: Pulmonary effort is normal. No respiratory distress.     Breath sounds: Normal breath sounds.  Abdominal:     General: Bowel sounds are normal. There is distension.     Palpations: Abdomen is soft.     Tenderness: There is generalized abdominal tenderness and tenderness in the epigastric area. There is no right CVA tenderness, left CVA tenderness, guarding or rebound.     Hernia: No hernia is present. There is no hernia in the umbilical area.  Skin:    General: Skin is warm and dry.  Neurological:     General: No focal deficit present.     Mental Status: She is alert.  Psychiatric:        Mood and Affect: Mood normal.        Behavior: Behavior normal.      ED Treatments / Results  Labs (all labs ordered are listed, but only abnormal results are displayed) Labs Reviewed  COMPREHENSIVE METABOLIC PANEL - Abnormal; Notable for the following components:      Result Value   Potassium 3.4 (*)    Calcium 8.8 (*)    Total Protein 5.9 (*)    All other components within normal limits  LIPASE, BLOOD  CBC WITH DIFFERENTIAL/PLATELET  URINALYSIS, ROUTINE W REFLEX MICROSCOPIC  POC OCCULT BLOOD, ED  TYPE AND SCREEN    EKG None  Radiology Ct Abdomen Pelvis W Contrast  Result Date: 01/14/2019 CLINICAL DATA:  58 y/o F; Abd distension Worsening abdominal pain and distention, history of malrotation, small bowel obstruction, and multiple surgeries. EXAM: CT ABDOMEN AND PELVIS WITH CONTRAST TECHNIQUE: Multidetector CT imaging of the abdomen and pelvis was performed using the standard protocol following bolus administration of intravenous contrast. CONTRAST:  100mL OMNIPAQUE IOHEXOL 300 MG/ML  SOLN COMPARISON:   11/30/2018 CT abdomen and pelvis FINDINGS: Lower chest: No acute abnormality. Hepatobiliary: No focal liver abnormality is seen. No gallstones, gallbladder wall thickening, or biliary dilatation. Pancreas: Unremarkable. No pancreatic ductal dilatation or surrounding inflammatory changes. Spleen: Normal in size without focal abnormality. Adrenals/Urinary Tract: Adrenal glands are unremarkable. Kidneys are normal, without renal calculi, focal lesion, or hydronephrosis. Bladder is unremarkable. Stomach/Bowel: Postsurgical changes including fundoplication, ventral hernia repair, and appendectomy. 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 No obstructive or inflammatory changes of the bowel identified. Vascular/Lymphatic: Aortic atherosclerosis. No enlarged abdominal or pelvic lymph nodes. Reproductive: Status post hysterectomy. No adnexal masses. Other: No ascites. Musculoskeletal: L5-S1 posterior interbody fusion with laminectomy. Multilevel discogenic degenerative changes with small disc bulges greatest at L3-4 and L4-5. No acute fracture. IMPRESSION: No acute process identified as explanation for abdominal pain. Stable CT of abdomen and pelvis. Electronically Signed   By: Mitzi HansenLance  Furusawa-Stratton M.D.   On: 01/14/2019 22:20    Procedures Procedures (including critical care time)  Medications Ordered in ED Medications  fentaNYL (SUBLIMAZE) injection 100 mcg (100 mcg Intravenous Given 01/14/19 2040)  iohexol (OMNIPAQUE) 300 MG/ML solution 100 mL (100 mLs Intravenous Contrast Given 01/14/19 2156)     Initial Impression / Assessment and Plan / ED Course  I have reviewed the triage vital signs and the nursing notes.  Pertinent labs & imaging results that were available during my care of the patient were reviewed by me and considered in my medical decision making (see chart for details).    Patient presents  today for evaluation of generalized abdominal pain and bloating.  This  is been going on since November however has acutely worsened over the past 3 days.  She did report dark bowel movements, fecal occult blood in the ED was negative.  She is not anemic with a hemoglobin of 14.1.  White count is not elevated at 8.0.  Potassium is very slightly low at 3.4.  Her urine is not consistent with infection, and lipase is not elevated.  CT abdomen pelvis was obtained, no acute abnormalities or cause for her pain found.  On my review she does have a large amount of gas, recommended a low FODMAP diet.  Given prescription for Bentyl trial for symptomatic relief.  In the ER her pain was treated with fentanyl.  She has a follow-up appointment with [facility] later this week to establish care, stressed the importance of keeping that appointment.    This patient was seen as a shared visit with Dr. Deretha Emory.  Return precautions were discussed with patient who states their understanding.  At the time of discharge patient denied any unaddressed complaints or concerns.  Patient is agreeable for discharge home.  Final Clinical Impressions(s) / ED Diagnoses   Final diagnoses:  Generalized abdominal pain    ED Discharge Orders         Ordered    dicyclomine (BENTYL) 20 MG tablet  3 times daily before meals & bedtime     01/14/19 2243           Cristina Gong, PA-C 01/15/19 0015    Vanetta Mulders, MD 01/15/19 [phone]

## 2019-01-14 NOTE — ED Notes (Signed)
Patient transported to CT 

## 2019-04-16 ENCOUNTER — Other Ambulatory Visit: Payer: Self-pay | Admitting: Gastroenterology

## 2019-04-16 ENCOUNTER — Ambulatory Visit
Admission: RE | Admit: 2019-04-16 | Discharge: 2019-04-16 | Disposition: A | Discharge status: Home / Self Care | Payer: Medicare Other | Source: Ambulatory Visit | Attending: Gastroenterology | Admitting: Gastroenterology

## 2019-04-16 ENCOUNTER — Other Ambulatory Visit: Payer: Self-pay

## 2019-04-16 DIAGNOSIS — R1900 Intra-abdominal and pelvic swelling, mass and lump, unspecified site: Secondary | ICD-10-CM

## 2019-04-16 DIAGNOSIS — R109 Unspecified abdominal pain: Secondary | ICD-10-CM

## 2019-08-01 ENCOUNTER — Encounter (HOSPITAL_COMMUNITY): Payer: Self-pay

## 2019-08-01 ENCOUNTER — Emergency Department (HOSPITAL_COMMUNITY)
Admission: EM | Admit: 2019-08-01 | Discharge: 2019-08-02 | Disposition: A | Discharge status: Home / Self Care | Payer: Medicare Other | Attending: Emergency Medicine | Admitting: Emergency Medicine

## 2019-08-01 ENCOUNTER — Other Ambulatory Visit: Payer: Self-pay

## 2019-08-01 DIAGNOSIS — R109 Unspecified abdominal pain: Secondary | ICD-10-CM | POA: Diagnosis present

## 2019-08-01 DIAGNOSIS — Z5321 Procedure and treatment not carried out due to patient leaving prior to being seen by health care provider: Secondary | ICD-10-CM | POA: Insufficient documentation

## 2019-08-01 MED ORDER — SODIUM CHLORIDE 0.9% FLUSH: 3.0000 mL | Freq: Once | INTRAVENOUS | Status: DC

## 2019-08-01 NOTE — ED Notes (Signed)
Patient called for room placement x2 with no answer. 

## 2019-08-01 NOTE — ED Notes (Signed)
Patient called for room placement x1 with no answer. 

## 2019-08-01 NOTE — ED Notes (Signed)
Pt is sitting in wheelchair.

## 2019-08-01 NOTE — ED Notes (Signed)
No answer for blood draw at this time. 

## 2019-08-01 NOTE — ED Notes (Signed)
Unable to collect patient labs at this time

## 2019-08-01 NOTE — ED Triage Notes (Signed)
Pt BIBA from home. Pt states that she has been having abd pain and has been unable to keep food down since Tuesday. Pt states it is difficult to sit upright.

## 2019-11-18 ENCOUNTER — Emergency Department (HOSPITAL_BASED_OUTPATIENT_CLINIC_OR_DEPARTMENT_OTHER): Payer: Medicare Other

## 2019-11-18 ENCOUNTER — Encounter (HOSPITAL_BASED_OUTPATIENT_CLINIC_OR_DEPARTMENT_OTHER): Payer: Self-pay | Admitting: *Deleted

## 2019-11-18 ENCOUNTER — Other Ambulatory Visit: Payer: Self-pay

## 2019-11-18 ENCOUNTER — Emergency Department (HOSPITAL_BASED_OUTPATIENT_CLINIC_OR_DEPARTMENT_OTHER)
Admission: EM | Admit: 2019-11-18 | Discharge: 2019-11-18 | Disposition: A | Discharge status: Home / Self Care | Payer: Medicare Other | Attending: Emergency Medicine | Admitting: Emergency Medicine

## 2019-11-18 DIAGNOSIS — G8929 Other chronic pain: Secondary | ICD-10-CM

## 2019-11-18 DIAGNOSIS — F17210 Nicotine dependence, cigarettes, uncomplicated: Secondary | ICD-10-CM | POA: Diagnosis not present

## 2019-11-18 DIAGNOSIS — M5441 Lumbago with sciatica, right side: Secondary | ICD-10-CM | POA: Insufficient documentation

## 2019-11-18 DIAGNOSIS — I10 Essential (primary) hypertension: Secondary | ICD-10-CM | POA: Diagnosis not present

## 2019-11-18 DIAGNOSIS — M545 Low back pain: Secondary | ICD-10-CM | POA: Diagnosis present

## 2019-11-18 DIAGNOSIS — Z79899 Other long term (current) drug therapy: Secondary | ICD-10-CM | POA: Diagnosis not present

## 2019-11-18 MED ORDER — CYCLOBENZAPRINE HCL 10 MG PO TABS: 10.0000 mg | ORAL_TABLET | Freq: Two times a day (BID) | ORAL | 0 refills | Status: DC | PRN

## 2019-11-18 MED ORDER — METHYLPREDNISOLONE 4 MG PO TBPK: ORAL_TABLET | ORAL | 0 refills | Status: DC

## 2019-11-18 MED ORDER — OXYCODONE-ACETAMINOPHEN 5-325 MG PO TABS
1.0000 | ORAL_TABLET | Freq: Once | ORAL | Status: AC
  Administered 2019-11-18: 10:00:00 1 via ORAL
  Filled 2019-11-18: qty 1

## 2019-11-18 MED ORDER — LIDOCAINE 5 % EX PTCH
1.0000 | MEDICATED_PATCH | CUTANEOUS | Status: DC
  Filled 2019-11-18: qty 1

## 2019-11-18 NOTE — ED Notes (Signed)
Pt is being driven home by her fiance

## 2019-11-18 NOTE — ED Triage Notes (Signed)
Right lower back pain since bending down to pick up her grandson 3 days ago. Ambulatory with limp

## 2019-11-18 NOTE — Discharge Instructions (Signed)
Your x-ray showed no acute fracture.  Suspect possible herniated disc versus musculoskeletal pain for sciatic nerve pain.  Have given you exercises to perform at home.  I give you a short course of muscle relaxers which will make you drowsy is to be careful take this medication.  Also prescribed you a steroid pack to take.  Please follow-up with your spine doctor.  If you develop any loss of bowel or bladder, inability to urinate, numbness in your groin return to the ER immediately.

## 2019-11-18 NOTE — ED Provider Notes (Signed)
[facility] EMERGENCY DEPARTMENT Provider Note   CSN: [id number] Arrival date & time: 11/18/19  4970     History   Chief Complaint Chief Complaint  Patient presents with  . Back Pain    HPI Heather [name] is a 58 y.o. female.     HPI 58 year old female with a past medical history significant for GI bleed, hypertension, chronic back pain who presents to the emergency department today for evaluation of acute on chronic low back pain.  Patient states that 3 days ago she was bending down to pick up her grandchild when she felt a pop in her lower back.  The pain stems from her lumbar spine and down the right leg.  Patient is able to walk but does have a limp.  Patient states the pain seems to be worse at night when she lies down and has to get up to go the bathroom.  Patient denies any new urinary symptoms.  Denies any loss of bowel or bladder, saddle paresthesias, urinary retention, lower extremity paresthesias, history of IV drug use, history of cancer, history of night sweats or fevers.  Patient has been taken Tylenol for her pain.  Can take no other medications secondary to history of GI bleeds.  Patient said the Tylenol is not working.  Rates the pain 8/10.  Pain is sharp in nature.  Denies any abdominal pain or any other associated symptoms. Past Medical History:  Diagnosis Date  . Barrett's esophageal ulceration   . Chronic back pain   . GI bleed   . Hypertension   . Pancreatic lesion     Patient Active Problem List   Diagnosis Date Noted  . GI bleed 02/25/2012  . Black stool 02/25/2012  . Nausea & vomiting 02/25/2012  . Diarrhea 02/25/2012  . SBO (small bowel obstruction) (HCC) 02/25/2012  . Barrett esophagus 02/25/2012  . Leukocytosis 02/25/2012  . Hyponatremia 02/25/2012  . Anemia 02/25/2012    Past Surgical History:  Procedure Laterality Date  . ABDOMINAL HYSTERECTOMY  2003   with Left Salpingo  . ABDOMINAL SURGERY     at age 5 for malrotation;  appendectomy  . BACK SURGERY    . CESAREAN SECTION    . ESOPHAGOGASTRODUODENOSCOPY  02/26/2012   Procedure: ESOPHAGOGASTRODUODENOSCOPY (EGD);  Surgeon: Petra Kuba, MD;  Location: [facility];  Service: Endoscopy;  Laterality: N/A;  . LAPAROSCOPIC INCISIONAL / UMBILICAL / VENTRAL HERNIA REPAIR  2008  . LAPAROSCOPIC NISSEN FUNDOPLICATION  2000  . VENTRAL HERNIA REPAIR  2003     OB History    Gravida  6   Para  2   Term  2   Preterm      AB  4   Living        SAB  4   TAB      Ectopic      Multiple      Live Births               Home Medications    Prior to Admission medications   Medication Sig Start Date End Date Taking? Authorizing Provider  acetaminophen (TYLENOL) 325 MG tablet Take 650 mg by mouth every 6 (six) hours as needed.    [provider]  cyclobenzaprine (FLEXERIL) 10 MG tablet Take 1 tablet (10 mg total) by mouth 3 (three) times daily as needed for muscle spasms. 05/12/17   Devoria Albe, MD  dicyclomine (BENTYL) 20 MG tablet Take 1 tablet (20 mg total) by  mouth 4 (four) times daily -  before meals and at bedtime. 01/14/19   Lorin Glass, PA-C  ibuprofen (ADVIL,MOTRIN) 800 MG tablet Take 800 mg by mouth every 8 (eight) hours as needed.    [provider]  omeprazole (PRILOSEC) 20 MG capsule Take 1 po BID x 2 weeks then once a day 05/12/17   Rolland Porter, MD  ondansetron (ZOFRAN ODT) 4 MG disintegrating tablet Take 1 tablet (4 mg total) by mouth every 8 (eight) hours as needed for nausea or vomiting. 11/30/18   Kinnie Feil, PA-C  predniSONE (DELTASONE) 20 MG tablet Take 3 po QD x 3d , then 2 po QD x 3d then 1 po QD x 3d 05/12/17   Rolland Porter, MD  promethazine (PHENERGAN) 25 MG tablet Take 1 tablet (25 mg total) by mouth every 6 (six) hours as needed for nausea or vomiting. 10/02/18   Lawyer, Harrell Gave, PA-C  traMADol (ULTRAM) 50 MG tablet Take 1 tablet (50 mg total) by mouth every 6 (six) hours as needed for severe pain.  10/02/18   Lawyer, Harrell Gave, PA-C  clonazePAM (KLONOPIN) 0.5 MG tablet Take 0.5 mg by mouth as needed.    02/25/12  [provider]    Family History Family History  Problem Relation Age of Onset  . Heart failure Mother   . Diabetes Mother   . Asthma Mother   . Heart failure Father   . Heart attack Father   . Heart failure Sister   . Diabetes Sister   . Kidney failure Sister   . Heart failure Brother     Social History Social History   Tobacco Use  . Smoking status: Current Every Day Smoker    Packs/day: 0.50    Types: Cigarettes  . Smokeless tobacco: Never Used  Substance Use Topics  . Alcohol use: No  . Drug use: No     Allergies   Penicillins   Review of Systems Review of Systems  Constitutional: Negative for chills and fever.  HENT: Negative for congestion.   Eyes: Negative for discharge.  Gastrointestinal: Negative for abdominal pain, diarrhea, nausea and vomiting.  Genitourinary: Negative for dysuria, flank pain, frequency, hematuria and urgency.  Musculoskeletal: Positive for arthralgias, back pain, gait problem and myalgias.  Skin: Negative for color change.  Neurological: Negative for headaches.  Psychiatric/Behavioral: Negative for confusion.     Physical Exam Updated Vital Signs BP (!) 153/93 (BP Location: Left Arm)   Pulse 97   Temp 98.4 F (36.9 C) (Oral)   Resp 18   Ht 5\' 5"  (1.651 m)   Wt 66.2 kg   SpO2 99%   BMI 24.30 kg/m   Physical Exam Vitals signs and nursing note reviewed.  Constitutional:      General: She is not in acute distress.    Appearance: She is well-developed. She is not ill-appearing or toxic-appearing.  HENT:     Head: Normocephalic and atraumatic.  Eyes:     General: No scleral icterus.       Right eye: No discharge.        Left eye: No discharge.  Neck:     Musculoskeletal: Normal range of motion.  Cardiovascular:     Pulses: Normal pulses.  Pulmonary:     Effort: No respiratory distress.   Musculoskeletal: Normal range of motion.     Right lower leg: No edema.     Left lower leg: No edema.     Comments: Midline L-spine tenderness and  right lumbar paraspinal tenderness that radiates down right buttocks.  Positive straight leg raise test on the right that reproduces the pain.  DP pulses are 2+.  Sensation intact.  Brisk cap refill.  Strength 5 out of 5 in right lower extremity.  Skin:    General: Skin is warm and dry.     Capillary Refill: Capillary refill takes less than 2 seconds.     Coloration: Skin is not pale.  Neurological:     Mental Status: She is alert.     Sensory: No sensory deficit.     Motor: No weakness.  Psychiatric:        Behavior: Behavior normal.        Thought Content: Thought content normal.        Judgment: Judgment normal.      ED Treatments / Results  Labs (all labs ordered are listed, but only abnormal results are displayed) Labs Reviewed - No data to display  EKG None  Radiology No results found.  Procedures Procedures (including critical care time)  Medications Ordered in ED Medications  oxyCODONE-acetaminophen (PERCOCET/ROXICET) 5-325 MG per tablet 1 tablet (has no administration in time range)  lidocaine (LIDODERM) 5 % 1 patch (has no administration in time range)     Initial Impression / Assessment and Plan / ED Course  I have reviewed the triage vital signs and the nursing notes.  Pertinent labs & imaging results that were available during my care of the patient were reviewed by me and considered in my medical decision making (see chart for details).        Patient with back pain.  No neurological deficits and normal neuro exam.  Patient can walk but states is painful.  No loss of bowel or bladder control.  No concern for cauda equina.  No fever, night sweats, weight loss, h/o cancer, IVDU.  RICE protocol and pain medicine indicated and discussed with patient.    Final Clinical Impressions(s) / ED Diagnoses   Final  diagnoses:  Chronic right-sided low back pain with right-sided sciatica    ED Discharge Orders         Ordered    cyclobenzaprine (FLEXERIL) 10 MG tablet  2 times daily PRN     11/18/19 1054    methylPREDNISolone (MEDROL DOSEPAK) 4 MG TBPK tablet     11/18/19 1054           Wallace KellerLeaphart, Deloyce Walthers T, PA-C 11/18/19 1055    Terald Sleeperrifan, Matthew J, MD 11/18/19 1759

## 2020-06-23 ENCOUNTER — Emergency Department (HOSPITAL_BASED_OUTPATIENT_CLINIC_OR_DEPARTMENT_OTHER): Payer: Medicare Other

## 2020-06-23 ENCOUNTER — Other Ambulatory Visit: Payer: Self-pay

## 2020-06-23 ENCOUNTER — Emergency Department (HOSPITAL_BASED_OUTPATIENT_CLINIC_OR_DEPARTMENT_OTHER)
Admission: EM | Admit: 2020-06-23 | Discharge: 2020-06-23 | Disposition: A | Discharge status: Home / Self Care | Payer: Medicare Other | Attending: Emergency Medicine | Admitting: Emergency Medicine

## 2020-06-23 ENCOUNTER — Encounter (HOSPITAL_BASED_OUTPATIENT_CLINIC_OR_DEPARTMENT_OTHER): Payer: Self-pay

## 2020-06-23 DIAGNOSIS — R1084 Generalized abdominal pain: Secondary | ICD-10-CM | POA: Insufficient documentation

## 2020-06-23 DIAGNOSIS — Z87891 Personal history of nicotine dependence: Secondary | ICD-10-CM | POA: Diagnosis not present

## 2020-06-23 DIAGNOSIS — Z79899 Other long term (current) drug therapy: Secondary | ICD-10-CM | POA: Insufficient documentation

## 2020-06-23 DIAGNOSIS — I10 Essential (primary) hypertension: Secondary | ICD-10-CM | POA: Insufficient documentation

## 2020-06-23 HISTORY — DX: Other intervertebral disc displacement, lumbar region: M51.26

## 2020-06-23 LAB — COMPREHENSIVE METABOLIC PANEL
ALT: 22 U/L (ref 0–44)
AST: 19 U/L (ref 15–41)
Albumin: 4.2 g/dL (ref 3.5–5.0)
Alkaline Phosphatase: 56 U/L (ref 38–126)
Anion gap: 11 (ref 5–15)
BUN: 11 mg/dL (ref 6–20)
CO2: 26 mmol/L (ref 22–32)
Calcium: 9 mg/dL (ref 8.9–10.3)
Chloride: 99 mmol/L (ref 98–111)
Creatinine, Ser: 0.73 mg/dL (ref 0.44–1.00)
GFR calc Af Amer: >60 mL/min (ref >60)
GFR calc non Af Amer: >60 mL/min (ref >60)
Glucose, Bld: 118 mg/dL — ABNORMAL HIGH (ref 70–99)
Potassium: 3.4 mmol/L — ABNORMAL LOW (ref 3.5–5.1)
Sodium: 136 mmol/L (ref 135–145)
Total Bilirubin: 0.5 mg/dL (ref 0.3–1.2)
Total Protein: 6.7 g/dL (ref 6.5–8.1)

## 2020-06-23 LAB — CBC WITH DIFFERENTIAL/PLATELET
Abs Immature Granulocytes: 0.03 10*3/uL (ref 0.00–0.07)
Basophils Absolute: 0.1 10*3/uL (ref 0.0–0.1)
Basophils Relative: 1 %
Eosinophils Absolute: 0.2 10*3/uL (ref 0.0–0.5)
Eosinophils Relative: 2 %
HCT: 41.7 % (ref 36.0–46.0)
Hemoglobin: 13.9 g/dL (ref 12.0–15.0)
Immature Granulocytes: 0 %
Lymphocytes Relative: 35 %
Lymphs Abs: 2.5 10*3/uL (ref 0.7–4.0)
MCH: 30.5 pg (ref 26.0–34.0)
MCHC: 33.3 g/dL (ref 30.0–36.0)
MCV: 91.4 fL (ref 80.0–100.0)
Monocytes Absolute: 0.3 10*3/uL (ref 0.1–1.0)
Monocytes Relative: 5 %
Neutro Abs: 4.1 10*3/uL (ref 1.7–7.7)
Neutrophils Relative %: 57 %
Platelets: 317 10*3/uL (ref 150–400)
RBC: 4.56 MIL/uL (ref 3.87–5.11)
RDW: 11.7 % (ref 11.5–15.5)
WBC: 7.2 10*3/uL (ref 4.0–10.5)
nRBC: 0 % (ref 0.0–0.2)

## 2020-06-23 LAB — URINALYSIS, ROUTINE W REFLEX MICROSCOPIC
Bilirubin Urine: NEGATIVE
Glucose, UA: NEGATIVE mg/dL
Hgb urine dipstick: NEGATIVE
Ketones, ur: NEGATIVE mg/dL
Leukocytes,Ua: NEGATIVE
Nitrite: NEGATIVE
Protein, ur: NEGATIVE mg/dL
Specific Gravity, Urine: 1.02 (ref 1.005–1.030)
pH: 6.5 (ref 5.0–8.0)

## 2020-06-23 LAB — LIPASE, BLOOD: Lipase: 19 U/L (ref 11–51)

## 2020-06-23 LAB — OCCULT BLOOD X 1 CARD TO LAB, STOOL: Fecal Occult Bld: POSITIVE — AB

## 2020-06-23 MED ORDER — ONDANSETRON HCL 4 MG/2ML IJ SOLN
4.0000 mg | Freq: Once | INTRAMUSCULAR | Status: AC
  Administered 2020-06-23: 4 mg via INTRAVENOUS
  Filled 2020-06-23: qty 2

## 2020-06-23 MED ORDER — MORPHINE SULFATE (PF) 4 MG/ML IV SOLN
4.0000 mg | Freq: Once | INTRAVENOUS | Status: AC
  Administered 2020-06-23: 4 mg via INTRAVENOUS
  Filled 2020-06-23: qty 1

## 2020-06-23 MED ORDER — OMEPRAZOLE 20 MG PO CPDR: 20.0000 mg | DELAYED_RELEASE_CAPSULE | Freq: Every day | ORAL | 0 refills | Status: DC

## 2020-06-23 MED ORDER — IOHEXOL 300 MG/ML  SOLN
100.0000 mL | Freq: Once | INTRAMUSCULAR | Status: AC | PRN
  Administered 2020-06-23: 100 mL via INTRAVENOUS

## 2020-06-23 NOTE — ED Triage Notes (Addendum)
Pt c/o abd bloating x 1 month-pain worse day 2-pt states she has bright red blood in stools x 2 days-NAD-steady gait

## 2020-06-23 NOTE — ED Provider Notes (Signed)
[facility] EMERGENCY DEPARTMENT Provider Note   CSN: [id number] Arrival date & time: 06/23/20  1110     History Chief Complaint  Patient presents with  . Abdominal Pain    Heather [name] is a 59 y.o. female with PMHx ulcers, GI bleed, acute blood loss anemia, dysphasia, colon malrotation status post surgical repair, Nissen fundoplication, s/p appendectomy, Barrett's esophagus, pancreatic duct dilation who presents to the ED today with complaint of diffuse abdominal pain x 1 month. Pt reports she has a hx of intermittent abdominal pain however 1 month ago she had a flare up. She reports that over the weekend she ate 1/2 hamburger and cole slaw and began having worsening pain. She also mentions BRBPR for the past 2 days. She has been taking zofran at home without relief of her nausea - she is supposed to be on Prilosec however mentions she has been out for several months due to being unable to afford her medications including antihypertensive meds. Pt denies fevers, chills, chest pain, SOB, diarrhea, urinary sx, or any other associated sx.   The history is provided by the patient and medical records.       Past Medical History:  Diagnosis Date  . Barrett's esophageal ulceration   . Chronic back pain   . GI bleed   . Hypertension   . Lumbar herniated disc   . Pancreatic lesion     Patient Active Problem List   Diagnosis Date Noted  . GI bleed 02/25/2012  . Black stool 02/25/2012  . Nausea & vomiting 02/25/2012  . Diarrhea 02/25/2012  . SBO (small bowel obstruction) (HCC) 02/25/2012  . Barrett esophagus 02/25/2012  . Leukocytosis 02/25/2012  . Hyponatremia 02/25/2012  . Anemia 02/25/2012    Past Surgical History:  Procedure Laterality Date  . ABDOMINAL HYSTERECTOMY  2003   with Left Salpingo  . ABDOMINAL SURGERY     at age 59 for malrotation; appendectomy  . BACK SURGERY    . CESAREAN SECTION    . ESOPHAGOGASTRODUODENOSCOPY  02/26/2012   Procedure:  ESOPHAGOGASTRODUODENOSCOPY (EGD);  Surgeon: Petra KubaMarc E Magod, MD;  Location: [facility];  Service: Endoscopy;  Laterality: N/A;  . LAPAROSCOPIC INCISIONAL / UMBILICAL / VENTRAL HERNIA REPAIR  2008  . LAPAROSCOPIC NISSEN FUNDOPLICATION  2000  . VENTRAL HERNIA REPAIR  2003     OB History    Gravida  6   Para  2   Term  2   Preterm      AB  4   Living        SAB  4   TAB      Ectopic      Multiple      Live Births              Family History  Problem Relation Age of Onset  . Heart failure Mother   . Diabetes Mother   . Asthma Mother   . Heart failure Father   . Heart attack Father   . Heart failure Sister   . Diabetes Sister   . Kidney failure Sister   . Heart failure Brother     Social History   Tobacco Use  . Smoking status: Former Smoker    Packs/day: 0.50    Types: Cigarettes  . Smokeless tobacco: Never Used  Vaping Use  . Vaping Use: Every day  Substance Use Topics  . Alcohol use: No  . Drug use: Yes    Types: Marijuana  Comment: uses up to 3x per month for pain as needed    Home Medications Prior to Admission medications   Medication Sig Start Date End Date Taking? Authorizing Provider  acetaminophen (TYLENOL) 325 MG tablet Take 650 mg by mouth every 6 (six) hours as needed.    [provider]  cyclobenzaprine (FLEXERIL) 10 MG tablet Take 1 tablet (10 mg total) by mouth 2 (two) times daily as needed for muscle spasms. 11/18/19   Rise Mu, PA-C  dicyclomine (BENTYL) 20 MG tablet Take 1 tablet (20 mg total) by mouth 4 (four) times daily -  before meals and at bedtime. 01/14/19   Cristina Gong, PA-C  ibuprofen (ADVIL,MOTRIN) 800 MG tablet Take 800 mg by mouth every 8 (eight) hours as needed.    [provider]  methylPREDNISolone (MEDROL DOSEPAK) 4 MG TBPK tablet Take as directed 11/18/19   Demetrios Loll T, PA-C  omeprazole (PRILOSEC) 20 MG capsule Take 1 po BID x 2 weeks then once a day 05/12/17   Devoria Albe, MD  omeprazole (PRILOSEC) 20 MG capsule Take 1 capsule (20 mg total) by mouth daily. 06/23/20 07/23/20  Hyman Hopes, Taiyana Kissler, PA-C  ondansetron (ZOFRAN ODT) 4 MG disintegrating tablet Take 1 tablet (4 mg total) by mouth every 8 (eight) hours as needed for nausea or vomiting. 11/30/18   Liberty Handy, PA-C  predniSONE (DELTASONE) 20 MG tablet Take 3 po QD x 3d , then 2 po QD x 3d then 1 po QD x 3d 05/12/17   Devoria Albe, MD  promethazine (PHENERGAN) 25 MG tablet Take 1 tablet (25 mg total) by mouth every 6 (six) hours as needed for nausea or vomiting. 10/02/18   Lawyer, Cristal Deer, PA-C  traMADol (ULTRAM) 50 MG tablet Take 1 tablet (50 mg total) by mouth every 6 (six) hours as needed for severe pain. 10/02/18   Lawyer, Cristal Deer, PA-C  clonazePAM (KLONOPIN) 0.5 MG tablet Take 0.5 mg by mouth as needed.    02/25/12  [provider]    Allergies    Penicillins  Review of Systems   Review of Systems  Constitutional: Negative for chills and fever.  Respiratory: Negative for shortness of breath.   Cardiovascular: Negative for chest pain.  Gastrointestinal: Positive for abdominal pain, blood in stool and nausea. Negative for vomiting.  Neurological: Negative for dizziness and light-headedness.  All other systems reviewed and are negative.   Physical Exam Updated Vital Signs BP (!) 165/85 (BP Location: Left Arm)   Pulse 94   Temp 98.9 F (37.2 C) (Oral)   Resp 14   Ht 5\' 1"  (1.549 m)   Wt 68.8 kg   SpO2 100%   BMI 28.66 kg/m   Physical Exam Vitals and nursing note reviewed.  Constitutional:      Appearance: She is not ill-appearing or diaphoretic.  HENT:     Head: Normocephalic and atraumatic.  Eyes:     Conjunctiva/sclera: Conjunctivae normal.  Cardiovascular:     Rate and Rhythm: Normal rate and regular rhythm.     Heart sounds: Normal heart sounds.  Pulmonary:     Effort: Pulmonary effort is normal.     Breath sounds: Normal breath sounds. No wheezing, rhonchi or  rales.  Abdominal:     General: Bowel sounds are decreased. There is distension.     Palpations: Abdomen is soft.     Tenderness: There is generalized abdominal tenderness. There is no right CVA tenderness, left CVA tenderness, guarding or rebound.  Musculoskeletal:     Cervical back: Neck supple.  Skin:    General: Skin is warm and dry.  Neurological:     Mental Status: She is alert.     ED Results / Procedures / Treatments   Labs (all labs ordered are listed, but only abnormal results are displayed) Labs Reviewed  COMPREHENSIVE METABOLIC PANEL - Abnormal; Notable for the following components:      Result Value   Potassium 3.4 (*)    Glucose, Bld 118 (*)    All other components within normal limits  OCCULT BLOOD X 1 CARD TO LAB, STOOL - Abnormal; Notable for the following components:   Fecal Occult Bld POSITIVE (*)    All other components within normal limits  LIPASE, BLOOD  CBC WITH DIFFERENTIAL/PLATELET  URINALYSIS, ROUTINE W REFLEX MICROSCOPIC    EKG None  Radiology CT Abdomen Pelvis W Contrast  Result Date: 06/23/2020 CLINICAL DATA:  Acute nonlocalized abdominal pain. Bloating. Bright red blood in stool. EXAM: CT ABDOMEN AND PELVIS WITH CONTRAST TECHNIQUE: Multidetector CT imaging of the abdomen and pelvis was performed using the standard protocol following bolus administration of intravenous contrast. CONTRAST:  OMNIPAQUE IOHEXOL 300 MG/ML  SOLN COMPARISON:  01/14/2019 FINDINGS: Lower chest: Unremarkable. Hepatobiliary: No suspicious focal abnormality within the liver parenchyma. There is no evidence for gallstones, gallbladder wall thickening, or pericholecystic fluid. Common bile duct measures 8 mm diameter, similar to prior. Pancreas: Diffuse distention of the main pancreatic duct noted with focal main pancreatic ductal dilatation in the head of the pancreas similar to prior and measuring about 8 mm today (coronal 43/4) main pancreatic duct dilatation extends to the  ampulla with no ampullary mass lesion evident. Spleen: No splenomegaly. No focal mass lesion. Adrenals/Urinary Tract: No adrenal nodule or mass. Right kidney unremarkable. 13 mm cyst lower pole left kidney is similar to prior. No evidence for hydroureter. The urinary bladder appears normal for the degree of distention. Stomach/Bowel: Stomach is nondistended. Duodenum does not cross the midline consistent with intestinal malrotation. Small bowel loops are concentrated in the right abdomen with colonic tracking in the left abdomen. The terminal ileum is normal. The appendix is not visualized, but there is no edema or inflammation in the region of the cecum. No gross colonic mass. No colonic wall thickening. Vascular/Lymphatic: There is abdominal aortic atherosclerosis without aneurysm. There is no gastrohepatic or hepatoduodenal ligament lymphadenopathy. No retroperitoneal or mesenteric lymphadenopathy. No pelvic sidewall lymphadenopathy. Reproductive: The uterus is surgically absent. There is no adnexal mass. Other: No intraperitoneal free fluid. Musculoskeletal: No worrisome lytic or sclerotic osseous abnormality. Status post ventral hernia repair with mesh placement. There is probably a small recurrent hernia containing only fat in the supraumbilical region. Lumbosacral fusion hardware evident. IMPRESSION: 1. No acute findings in the abdomen or pelvis. Specifically, no findings to explain the patient's history of abdominal pain and bloody stools. 2. Intestinal malrotation. 3. Aortic Atherosclerosis (ICD10-I70.0). Electronically Signed   By: Kennith Center M.D.   On: 06/23/2020 13:07    Procedures Procedures (including critical care time)  Medications Ordered in ED Medications  ondansetron (ZOFRAN) injection 4 mg (4 mg Intravenous Given 06/23/20 1229)  morphine 4 MG/ML injection 4 mg (4 mg Intravenous Given 06/23/20 1231)  iohexol (OMNIPAQUE) 300 MG/ML solution 100 mL (100 mLs Intravenous Contrast Given 06/23/20  1246)    ED Course  I have reviewed the triage vital signs and the nursing notes.  Pertinent labs & imaging results that were available during my care  of the patient were reviewed by me and considered in my medical decision making (see chart for details).    MDM Rules/Calculators/A&P                          59 year old female who presents to the ED today with complaint of acute on chronic abdominal pain for the past month, worsening in the past several days after eating A hamburger during July 4 weekend.  She is supposed be on Prilosec however states she has been out of her medication for several months due to being unable to afford it.  Patient does have a history of multiple abdominal surgeries as well.  Patient is nontoxic-appearing.  On exam patient abdomen does appear slightly distended however it is soft.  She does have diffuse abdominal tenderness to palpation without rebound or guarding.  Per chart review patient has been seen multiple times in the past for same however we have not seen her this year.  She does report she followed up with GI in the last 1 to 2 years and had both an upper endoscopy and a colonoscopy without any acute findings however I am unable to see this in her chart review.  Even her abdominal pain will obtain screening labs at this time.  Given her multiple abdominal surgeries in the past will obtain CT scan as well.  Will provide pain medication and nausea medication and reevaluate.  Does indicate that she has had bright red blood per rectum, will do fecal occult test.  She does have a history of GI bleed however denies any black tarry stool.  Vital signs are stable, patient hemodynamically stable at this time.  CBC without leukocytosis. Hgb stable at 13.9 Guaiac positive however hemodynamically stable. No melena appreciated.  CMP with potassium 3.4. No other electrolyte abnormalities.  Lipase within normal limits.  U/A negative for infection.   CT scan without  acute findings at this time. On reeval pt reports improvement in sx. Will discharge at this time with GI follow up. Have strongly encouraged pt to restart omeprazole. Have prescribed same and provided GoodRx coupon. Pt is in agreement with plan and stable for discharge home.   This note was prepared using Dragon voice recognition software and may include unintentional dictation errors due to the inherent limitations of voice recognition software.  Final Clinical Impression(s) / ED Diagnoses Final diagnoses:  Generalized abdominal pain    Rx / DC Orders ED Discharge Orders         Ordered    omeprazole (PRILOSEC) 20 MG capsule  Daily     Discontinue  Reprint     06/23/20 1401           Discharge Instructions     Your labwork and imaging was reassuring today. Your symptoms are likely related to your chronic abdominal issues. Please pick up your omeprazole and start taking as prescribed. Follow up with your GI doctors regarding your abdominal pain.   Return to the Ed for any worsening symptoms       Tanda Rockers, Cordelia Poche 06/23/20 1404    Raeford Razor, MD 06/24/20 [phone]

## 2020-06-23 NOTE — Discharge Instructions (Addendum)
Your labwork and imaging was reassuring today. Your symptoms are likely related to your chronic abdominal issues. Please pick up your omeprazole and start taking as prescribed. Follow up with your GI doctors regarding your abdominal pain.   Return to the Ed for any worsening symptoms

## 2020-07-23 ENCOUNTER — Emergency Department (HOSPITAL_COMMUNITY): Payer: Medicare Other

## 2020-07-23 ENCOUNTER — Other Ambulatory Visit: Payer: Self-pay

## 2020-07-23 ENCOUNTER — Encounter (HOSPITAL_COMMUNITY): Payer: Self-pay

## 2020-07-23 ENCOUNTER — Emergency Department (HOSPITAL_COMMUNITY)
Admission: EM | Admit: 2020-07-23 | Discharge: 2020-07-23 | Disposition: A | Discharge status: Home / Self Care | Payer: Medicare Other | Attending: Emergency Medicine | Admitting: Emergency Medicine

## 2020-07-23 DIAGNOSIS — F0390 Unspecified dementia without behavioral disturbance: Secondary | ICD-10-CM | POA: Insufficient documentation

## 2020-07-23 DIAGNOSIS — G9340 Encephalopathy, unspecified: Secondary | ICD-10-CM | POA: Diagnosis not present

## 2020-07-23 DIAGNOSIS — Z87891 Personal history of nicotine dependence: Secondary | ICD-10-CM | POA: Diagnosis not present

## 2020-07-23 DIAGNOSIS — I6522 Occlusion and stenosis of left carotid artery: Secondary | ICD-10-CM | POA: Insufficient documentation

## 2020-07-23 DIAGNOSIS — Z79899 Other long term (current) drug therapy: Secondary | ICD-10-CM | POA: Insufficient documentation

## 2020-07-23 DIAGNOSIS — R42 Dizziness and giddiness: Secondary | ICD-10-CM | POA: Diagnosis not present

## 2020-07-23 DIAGNOSIS — K117 Disturbances of salivary secretion: Secondary | ICD-10-CM | POA: Insufficient documentation

## 2020-07-23 DIAGNOSIS — R531 Weakness: Secondary | ICD-10-CM | POA: Diagnosis present

## 2020-07-23 DIAGNOSIS — I10 Essential (primary) hypertension: Secondary | ICD-10-CM | POA: Insufficient documentation

## 2020-07-23 HISTORY — DX: Unspecified dementia, unspecified severity, without behavioral disturbance, psychotic disturbance, mood disturbance, and anxiety: F03.90

## 2020-07-23 LAB — DIFFERENTIAL
Abs Immature Granulocytes: 0.02 10*3/uL (ref 0.00–0.07)
Basophils Absolute: 0 10*3/uL (ref 0.0–0.1)
Basophils Relative: 1 %
Eosinophils Absolute: 0.2 10*3/uL (ref 0.0–0.5)
Eosinophils Relative: 2 %
Immature Granulocytes: 0 %
Lymphocytes Relative: 28 %
Lymphs Abs: 2 10*3/uL (ref 0.7–4.0)
Monocytes Absolute: 0.4 10*3/uL (ref 0.1–1.0)
Monocytes Relative: 6 %
Neutro Abs: 4.5 10*3/uL (ref 1.7–7.7)
Neutrophils Relative %: 63 %

## 2020-07-23 LAB — APTT: aPTT: 26 seconds (ref 24–36)

## 2020-07-23 LAB — URINALYSIS, ROUTINE W REFLEX MICROSCOPIC
Bilirubin Urine: NEGATIVE
Glucose, UA: NEGATIVE mg/dL
Hgb urine dipstick: NEGATIVE
Ketones, ur: NEGATIVE mg/dL
Leukocytes,Ua: NEGATIVE
Nitrite: NEGATIVE
Protein, ur: NEGATIVE mg/dL
Specific Gravity, Urine: 1.008 (ref 1.005–1.030)
pH: 7 (ref 5.0–8.0)

## 2020-07-23 LAB — COMPREHENSIVE METABOLIC PANEL
ALT: 14 U/L (ref 0–44)
AST: 16 U/L (ref 15–41)
Albumin: 4.5 g/dL (ref 3.5–5.0)
Alkaline Phosphatase: 51 U/L (ref 38–126)
Anion gap: 13 (ref 5–15)
BUN: 18 mg/dL (ref 6–20)
CO2: 25 mmol/L (ref 22–32)
Calcium: 9.6 mg/dL (ref 8.9–10.3)
Chloride: 102 mmol/L (ref 98–111)
Creatinine, Ser: 0.66 mg/dL (ref 0.44–1.00)
GFR calc Af Amer: >60 mL/min (ref >60)
GFR calc non Af Amer: >60 mL/min (ref >60)
Glucose, Bld: 110 mg/dL — ABNORMAL HIGH (ref 70–99)
Potassium: 3.6 mmol/L (ref 3.5–5.1)
Sodium: 140 mmol/L (ref 135–145)
Total Bilirubin: 0.4 mg/dL (ref 0.3–1.2)
Total Protein: 7.1 g/dL (ref 6.5–8.1)

## 2020-07-23 LAB — CBC
HCT: 41.8 % (ref 36.0–46.0)
Hemoglobin: 13.6 g/dL (ref 12.0–15.0)
MCH: 30.2 pg (ref 26.0–34.0)
MCHC: 32.5 g/dL (ref 30.0–36.0)
MCV: 92.9 fL (ref 80.0–100.0)
Platelets: 223 10*3/uL (ref 150–400)
RBC: 4.5 MIL/uL (ref 3.87–5.11)
RDW: 11.8 % (ref 11.5–15.5)
WBC: 7.1 10*3/uL (ref 4.0–10.5)
nRBC: 0 % (ref 0.0–0.2)

## 2020-07-23 LAB — I-STAT CHEM 8, ED
BUN: 18 mg/dL (ref 6–20)
Calcium, Ion: 1.25 mmol/L (ref 1.15–1.40)
Chloride: 101 mmol/L (ref 98–111)
Creatinine, Ser: 0.6 mg/dL (ref 0.44–1.00)
Glucose, Bld: 110 mg/dL — ABNORMAL HIGH (ref 70–99)
HCT: 40 % (ref 36.0–46.0)
Hemoglobin: 13.6 g/dL (ref 12.0–15.0)
Potassium: 3.5 mmol/L (ref 3.5–5.1)
Sodium: 141 mmol/L (ref 135–145)
TCO2: 29 mmol/L (ref 22–32)

## 2020-07-23 LAB — ETHANOL: Alcohol, Ethyl (B): <10 mg/dL (ref <10)

## 2020-07-23 LAB — RAPID URINE DRUG SCREEN, HOSP PERFORMED
Amphetamines: POSITIVE — AB
Barbiturates: NOT DETECTED
Benzodiazepines: POSITIVE — AB
Cocaine: NOT DETECTED
Opiates: POSITIVE — AB
Tetrahydrocannabinol: POSITIVE — AB

## 2020-07-23 LAB — PROTIME-INR
INR: 1 (ref 0.8–1.2)
Prothrombin Time: 12.3 seconds (ref 11.4–15.2)

## 2020-07-23 LAB — CBG MONITORING, ED: Glucose-Capillary: 117 mg/dL — ABNORMAL HIGH (ref 70–99)

## 2020-07-23 LAB — POC URINE PREG, ED: Preg Test, Ur: NEGATIVE

## 2020-07-23 MED ORDER — IOHEXOL 350 MG/ML SOLN
75.0000 mL | Freq: Once | INTRAVENOUS | Status: AC | PRN
  Administered 2020-07-23: 75 mL via INTRAVENOUS

## 2020-07-23 MED ORDER — BUPRENORPHINE HCL-NALOXONE HCL 8-2 MG SL SUBL
1.0000 | SUBLINGUAL_TABLET | Freq: Once | SUBLINGUAL | Status: AC
  Administered 2020-07-23: 1 via SUBLINGUAL
  Filled 2020-07-23: qty 1

## 2020-07-23 NOTE — ED Triage Notes (Signed)
Ems reports pt was normal last night and went to bed at 9:30 pm.  Reports daughter says pt woke up at 3am and was very talkative.  Reports more talkative than usual, and confused.  Says got up and started getting ready thinking it was time to get up.  This morning daughter noticed slurred speech, drooling on left side.  Pt dropped her coffee cup out of her left hand, unable to grip it.  Also fell today.  EMS says pt was ambulatory but had to have assistance because her balance was off.  cbg 133.  EMS started 18g in r ac and 20g IV in left hand.

## 2020-07-23 NOTE — Discharge Instructions (Addendum)
We saw in the ER for drooling, slurring of the speech and balance issues.  The MRI of your brain, CT angiogram of your head and neck are not showing any concerning findings. It is possible that your symptoms could be due to medications you are taking.  We recommend that you follow-up with your primary care doctors for evaluation of your prescribed medications.  We also recommend that you follow-up with neurologist for further evaluation from TIA/mini stroke perspective. For now we recommend that he start taking 81 mg aspirin every day.  Return to the ER if your symptoms get worse.

## 2020-07-23 NOTE — ED Notes (Signed)
Pt to CT at this time.

## 2020-07-23 NOTE — ED Provider Notes (Addendum)
[facility] EMERGENCY DEPARTMENT Provider Note   CSN: [id number] Arrival date & time: 07/23/20  0932     History Chief Complaint  Patient presents with  . Weakness    Heather [name] is a 59 y.o. female.  HPI     59 year old female with history of hypertension, early dementia comes in a chief complaint of weakness.  According to EMS report, patient was last normal last night when she went to bed around 9:30 PM.  Allegedly patient woke up in the middle the night.  Daughter noted that patient was unusually talkative at that time.  This morning when she saw her again, patient had slurred speech and appeared little bit confused.  Patient was also drooling from her left side, and EMS was called.  Patient states that she does feel like her speech is not like it normally is.  She also states that she was drooping on the left side of her face and having some drooling.  No history of strokes.  She denies any current numbness, tingling.  She did indicate that she was having difficulty with balance.  Normally she sways left or right, this time she was swaying more backwards when she was walking.  Patient is noted to be on Suboxone and also some anxiety/depression meds.  She denies any extra medications or new medications.   Past Medical History:  Diagnosis Date  . Barrett's esophageal ulceration   . Chronic back pain   . Dementia (HCC)   . GI bleed   . Hypertension   . Lumbar herniated disc   . Pancreatic lesion     Patient Active Problem List   Diagnosis Date Noted  . GI bleed 02/25/2012  . Black stool 02/25/2012  . Nausea & vomiting 02/25/2012  . Diarrhea 02/25/2012  . SBO (small bowel obstruction) (HCC) 02/25/2012  . Barrett esophagus 02/25/2012  . Leukocytosis 02/25/2012  . Hyponatremia 02/25/2012  . Anemia 02/25/2012    Past Surgical History:  Procedure Laterality Date  . ABDOMINAL HYSTERECTOMY  2003   with Left Salpingo  . 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 BACK SURGERY    . CESAREAN SECTION    . ESOPHAGOGASTRODUODENOSCOPY  02/26/2012   Procedure: ESOPHAGOGASTRODUODENOSCOPY (EGD);  Surgeon: Petra Kuba, MD;  Location: [facility];  Service: Endoscopy;  Laterality: N/A;  . LAPAROSCOPIC INCISIONAL / UMBILICAL / VENTRAL HERNIA REPAIR  2008  . LAPAROSCOPIC NISSEN FUNDOPLICATION  2000  . VENTRAL HERNIA REPAIR  2003     OB History    Gravida  6   Para  2   Term  2   Preterm      AB  4   Living        SAB  4   TAB      Ectopic      Multiple      Live Births              Family History  Problem Relation Age of Onset  . Heart failure Mother   . Diabetes Mother   . Asthma Mother   . Heart failure Father   . Heart attack Father   . Heart failure Sister   . Diabetes Sister   . Kidney failure Sister   . Heart failure Brother     Social History   Tobacco Use  . Smoking status: Former Smoker    Packs/day: 0.50    Types: Cigarettes  .  Smokeless tobacco: Never Used  Vaping Use  . Vaping Use: Every day  Substance Use Topics  . Alcohol use: No  . Drug use: Yes    Types: Marijuana    Comment: uses up to 3x per month for pain as needed    Home Medications Prior to Admission medications   Medication Sig Start Date End Date Taking? Authorizing Provider  acetaminophen (TYLENOL) 325 MG tablet Take 650 mg by mouth every 6 (six) hours as needed.   Yes [provider]  ALPRAZolam Prudy Feeler) 0.5 MG tablet Take 1 tablet by mouth 2 (two) times daily as needed. 12/24/19  Yes [provider]  Buprenorphine HCl-Naloxone HCl 8-2 MG FILM Place 2 strips under the tongue daily at 8 pm. 07/02/20  Yes [provider]  dicyclomine (BENTYL) 20 MG tablet Take 1 tablet (20 mg total) by mouth 4 (four) times daily -  before meals and at bedtime. 01/14/19  Yes Cristina Gong, PA-C  ibuprofen (ADVIL,MOTRIN) 800 MG tablet Take 800 mg by mouth every 8 (eight) hours as needed.   Yes  [provider]  omeprazole (PRILOSEC) 20 MG capsule Take 1 capsule (20 mg total) by mouth daily. Patient taking differently: Take 20 mg by mouth 2 (two) times daily.  06/23/20 07/23/20 Yes Venter, Margaux, PA-C  ondansetron (ZOFRAN ODT) 4 MG disintegrating tablet Take 1 tablet (4 mg total) by mouth every 8 (eight) hours as needed for nausea or vomiting. 11/30/18  Yes Sharen Heck J, PA-C  QUEtiapine (SEROQUEL) 25 MG tablet Take 25 mg by mouth at bedtime. 06/30/20  Yes [provider]  clonazePAM (KLONOPIN) 0.5 MG tablet Take 0.5 mg by mouth as needed.    02/25/12  [provider]    Allergies    Penicillins  Review of Systems   Review of Systems  Constitutional: Positive for activity change.  Respiratory: Negative for shortness of breath.   Cardiovascular: Negative for chest pain.  Gastrointestinal: Negative for nausea and vomiting.  Neurological: Positive for dizziness, facial asymmetry and speech difficulty.  All other systems reviewed and are negative.   Physical Exam Updated Vital Signs BP 140/80   Pulse 78   Temp 98.5 F (36.9 C) (Oral)   Resp (!) 22   Ht  (1.549 m)   Wt 68 kg   SpO2 99%   BMI 28.33 kg/m   Physical Exam Vitals and nursing note reviewed.  Constitutional:      Appearance: She is well-developed.  HENT:     Head: Normocephalic and atraumatic.  Cardiovascular:     Rate and Rhythm: Normal rate.  Pulmonary:     Effort: Pulmonary effort is normal.  Abdominal:     General: Bowel sounds are normal.  Musculoskeletal:     Cervical back: Normal range of motion and neck supple.  Skin:    General: Skin is warm and dry.  Neurological:     General: No focal deficit present.     Mental Status: She is alert and oriented to person, place, and time.     Cranial Nerves: No cranial nerve deficit.     Sensory: No sensory deficit.     Motor: No weakness.     ED Results / Procedures / Treatments   Labs (all labs ordered are  listed, but only abnormal results are displayed) Labs Reviewed  COMPREHENSIVE METABOLIC PANEL - Abnormal; Notable for the following components:      Result Value   Glucose, Bld 110 (*)  All other components within normal limits  RAPID URINE DRUG SCREEN, HOSP PERFORMED - Abnormal; Notable for the following components:   Opiates POSITIVE (*)    Benzodiazepines POSITIVE (*)    Amphetamines POSITIVE (*)    Tetrahydrocannabinol POSITIVE (*)    All other components within normal limits  URINALYSIS, ROUTINE W REFLEX MICROSCOPIC - Abnormal; Notable for the following components:   Color, Urine STRAW (*)    All other components within normal limits  CBG MONITORING, ED - Abnormal; Notable for the following components:   Glucose-Capillary 117 (*)    All other components within normal limits  I-STAT CHEM 8, ED - Abnormal; Notable for the following components:   Glucose, Bld 110 (*)    All other components within normal limits  ETHANOL  PROTIME-INR  APTT  CBC  DIFFERENTIAL  POC URINE PREG, ED    EKG EKG Interpretation  Date/Time:  Thursday July 23 2020 09:37:42 EDT Ventricular Rate:  93 PR Interval:    QRS Duration: 89 QT Interval:  374 QTC Calculation: 466 R Axis:   69 Text Interpretation: Sinus rhythm Borderline repolarization abnormality No acute changes No significant change since last tracing Confirmed by Derwood Kaplan [phone]) on 07/23/2020 10:07:13 AM   Radiology CT Angio Head W or Wo Contrast  Result Date: 07/23/2020 CLINICAL DATA:  Altered mental status.  Rule out stroke EXAM: CT ANGIOGRAPHY HEAD AND NECK TECHNIQUE: Multidetector CT imaging of the head and neck was performed using the standard protocol during bolus administration of intravenous contrast. Multiplanar CT image reconstructions and MIPs were obtained to evaluate the vascular anatomy. Carotid stenosis measurements (when applicable) are obtained utilizing NASCET criteria, using the distal internal carotid diameter  as the denominator. CONTRAST:  54mL OMNIPAQUE IOHEXOL 350 MG/ML SOLN COMPARISON:  CT head MRI head 07/23/2020 FINDINGS: CTA NECK FINDINGS Aortic arch: Standard branching. Imaged portion shows no evidence of aneurysm or dissection. No significant stenosis of the major arch vessel origins. Mild atherosclerotic calcification in the aortic arch. Proximal great vessels widely patent. She Right carotid system: Normal right carotid without atherosclerotic disease stenosis or dissection. Tortuosity of the right internal carotid artery. Left carotid system: Mild atherosclerotic calcification left carotid bifurcation without stenosis. Left carotid is tortuous Vertebral arteries: Both vertebral arteries are patent to the basilar without stenosis. Left vertebral artery is dominant. Skeleton: No acute skeletal abnormality. Other neck: Negative for mass or adenopathy. Upper chest: Lung apices clear bilaterally. Review of the MIP images confirms the above findings CTA HEAD FINDINGS Anterior circulation: Cavernous carotid widely patent bilaterally with minimal atherosclerotic disease. Anterior and middle cerebral arteries patent bilaterally without stenosis. Negative for aneurysm. Posterior circulation: Left vertebral artery dominant and is the primary supply to the basilar. Small right vertebral artery with small contribution to the basilar. PICA patent bilaterally. Basilar widely patent. Superior cerebellar and posterior cerebral arteries patent bilaterally without stenosis or occlusion. Negative for aneurysm. Venous sinuses: Normal venous enhancement Anatomic variants: None Review of the MIP images confirms the above findings IMPRESSION: Very mild atherosclerotic disease. No significant carotid or vertebral artery stenosis in the neck No intracranial stenosis or large vessel occlusion. Negative for vascular malformation. Electronically Signed   By: Marlan Palau M.D.   On: 07/23/2020 13:44   CT HEAD WO CONTRAST  Result  Date: 07/23/2020 CLINICAL DATA:  Dizziness EXAM: CT HEAD WITHOUT CONTRAST TECHNIQUE: Contiguous axial images were obtained from the base of the skull through the vertex without intravenous contrast. COMPARISON:  None. FINDINGS: Brain: No acute  intracranial abnormality. Specifically, no hemorrhage, hydrocephalus, mass lesion, acute infarction, or significant intracranial injury. Vascular: No hyperdense vessel or unexpected calcification. Skull: No acute calvarial abnormality. Sinuses/Orbits: No acute findings Other: None IMPRESSION: Normal study. Electronically Signed   By: Charlett Nose M.D.   On: 07/23/2020 11:02   CT Angio Neck W and/or Wo Contrast  Result Date: 07/23/2020 CLINICAL DATA:  Altered mental status.  Rule out stroke EXAM: CT ANGIOGRAPHY HEAD AND NECK TECHNIQUE: Multidetector CT imaging of the head and neck was performed using the standard protocol during bolus administration of intravenous contrast. Multiplanar CT image reconstructions and MIPs were obtained to evaluate the vascular anatomy. Carotid stenosis measurements (when applicable) are obtained utilizing NASCET criteria, using the distal internal carotid diameter as the denominator. CONTRAST:  58mL OMNIPAQUE IOHEXOL 350 MG/ML SOLN COMPARISON:  CT head MRI head 07/23/2020 FINDINGS: CTA NECK FINDINGS Aortic arch: Standard branching. Imaged portion shows no evidence of aneurysm or dissection. No significant stenosis of the major arch vessel origins. Mild atherosclerotic calcification in the aortic arch. Proximal great vessels widely patent. She Right carotid system: Normal right carotid without atherosclerotic disease stenosis or dissection. Tortuosity of the right internal carotid artery. Left carotid system: Mild atherosclerotic calcification left carotid bifurcation without stenosis. Left carotid is tortuous Vertebral arteries: Both vertebral arteries are patent to the basilar without stenosis. Left vertebral artery is dominant. Skeleton: No  acute skeletal abnormality. Other neck: Negative for mass or adenopathy. Upper chest: Lung apices clear bilaterally. Review of the MIP images confirms the above findings CTA HEAD FINDINGS Anterior circulation: Cavernous carotid widely patent bilaterally with minimal atherosclerotic disease. Anterior and middle cerebral arteries patent bilaterally without stenosis. Negative for aneurysm. Posterior circulation: Left vertebral artery dominant and is the primary supply to the basilar. Small right vertebral artery with small contribution to the basilar. PICA patent bilaterally. Basilar widely patent. Superior cerebellar and posterior cerebral arteries patent bilaterally without stenosis or occlusion. Negative for aneurysm. Venous sinuses: Normal venous enhancement Anatomic variants: None Review of the MIP images confirms the above findings IMPRESSION: Very mild atherosclerotic disease. No significant carotid or vertebral artery stenosis in the neck No intracranial stenosis or large vessel occlusion. Negative for vascular malformation. Electronically Signed   By: Marlan Palau M.D.   On: 07/23/2020 13:44   MR BRAIN WO CONTRAST  Result Date: 07/23/2020 CLINICAL DATA:  Acute mental status changes today. EXAM: MRI HEAD WITHOUT CONTRAST TECHNIQUE: Multiplanar, multiecho pulse sequences of the brain and surrounding structures were obtained without intravenous contrast. COMPARISON:  Head CT earlier same day FINDINGS: Brain: The study suffers from pronounced motion degradation. Diffusion imaging is sufficiently diagnostic. No acute or subacute infarction. Other pulse sequences appear normal, though motion degraded. Hydrocephalus or extra-axial collection. Vascular: Major vessels at the base of the brain show flow. Skull and upper cervical spine: Negative Sinuses/Orbits: Clear/normal Other: None IMPRESSION: Motion degraded examination. Diffusion imaging is of sufficient quality and does not show any acute or subacute  infarction. No other acute or chronic brain abnormality is seen. Electronically Signed   By: Paulina Fusi M.D.   On: 07/23/2020 12:19    Procedures Procedures (including critical care time)  Medications Ordered in ED Medications  buprenorphine-naloxone (SUBOXONE) 8-2 mg per SL tablet 1 tablet (1 tablet Sublingual Given 07/23/20 1043)  iohexol (OMNIPAQUE) 350 MG/ML injection 75 mL (75 mLs Intravenous Contrast Given 07/23/20 1326)    ED Course  I have reviewed the triage vital signs and the nursing notes.  Pertinent labs &  imaging results that were available during my care of the patient were reviewed by me and considered in my medical decision making (see chart for details).  Clinical Course as of Jul 24 1431  Thu Jul 23, 2020  1432 Results discussed with patient and daughter. Daughter will pick her up.   [AN]    Clinical Course User Index [AN] Derwood KaplanNanavati, Sandara Tyree, MD   MDM Rules/Calculators/A&P                          59 year old female with history of hypertension comes in a chief complaint of dizziness, slurred speech, facial droop and drooling.  She has history of hyper tension but no strokes.  On exam, we do not have any objective deficits.  Patient states that it does appear to her that her speech is still not normal.  Differential diagnosis includes stroke, brain bleed, TIA. Additionally, patient is on pain medications, sedated as they could be contributing.  We will start with MRI of the brain.  CT angiogram also ordered. Her neuro exam is nonfocal.  Given that patient still feels that her speech is not back to baseline, if the MRI of the brain is negative then we will proceed with the CT angiogram and discharge if the angiogram is also negative.  We will start her on aspirin.  Final Clinical Impression(s) / ED Diagnoses Final diagnoses:  Encephalopathy  Dizziness  Drooling    Rx / DC Orders ED Discharge Orders    None       Derwood KaplanNanavati, Micai Apolinar, MD 07/23/20 1317      Derwood KaplanNanavati, Jhan Conery, MD 07/23/20 1433

## 2020-11-14 ENCOUNTER — Emergency Department (HOSPITAL_BASED_OUTPATIENT_CLINIC_OR_DEPARTMENT_OTHER): Payer: Medicare Other

## 2020-11-14 ENCOUNTER — Other Ambulatory Visit: Payer: Self-pay

## 2020-11-14 ENCOUNTER — Emergency Department (HOSPITAL_BASED_OUTPATIENT_CLINIC_OR_DEPARTMENT_OTHER)
Admission: EM | Admit: 2020-11-14 | Discharge: 2020-11-14 | Disposition: A | Discharge status: Home / Self Care | Payer: Medicare Other | Attending: Emergency Medicine | Admitting: Emergency Medicine

## 2020-11-14 DIAGNOSIS — R079 Chest pain, unspecified: Secondary | ICD-10-CM | POA: Diagnosis not present

## 2020-11-14 DIAGNOSIS — R0602 Shortness of breath: Secondary | ICD-10-CM | POA: Diagnosis not present

## 2020-11-14 DIAGNOSIS — Z79899 Other long term (current) drug therapy: Secondary | ICD-10-CM | POA: Diagnosis not present

## 2020-11-14 DIAGNOSIS — I10 Essential (primary) hypertension: Secondary | ICD-10-CM | POA: Diagnosis not present

## 2020-11-14 DIAGNOSIS — Z87891 Personal history of nicotine dependence: Secondary | ICD-10-CM | POA: Insufficient documentation

## 2020-11-14 DIAGNOSIS — F0390 Unspecified dementia without behavioral disturbance: Secondary | ICD-10-CM | POA: Diagnosis not present

## 2020-11-14 DIAGNOSIS — Z20822 Contact with and (suspected) exposure to covid-19: Secondary | ICD-10-CM | POA: Diagnosis not present

## 2020-11-14 LAB — COMPREHENSIVE METABOLIC PANEL
ALT: 15 U/L (ref 0–44)
AST: 16 U/L (ref 15–41)
Albumin: 4.3 g/dL (ref 3.5–5.0)
Alkaline Phosphatase: 57 U/L (ref 38–126)
Anion gap: 11 (ref 5–15)
BUN: 9 mg/dL (ref 6–20)
CO2: 26 mmol/L (ref 22–32)
Calcium: 9.2 mg/dL (ref 8.9–10.3)
Chloride: 103 mmol/L (ref 98–111)
Creatinine, Ser: 0.79 mg/dL (ref 0.44–1.00)
GFR, Estimated: >60 mL/min (ref >60)
Glucose, Bld: 116 mg/dL — ABNORMAL HIGH (ref 70–99)
Potassium: 3.4 mmol/L — ABNORMAL LOW (ref 3.5–5.1)
Sodium: 140 mmol/L (ref 135–145)
Total Bilirubin: 0.5 mg/dL (ref 0.3–1.2)
Total Protein: 7.1 g/dL (ref 6.5–8.1)

## 2020-11-14 LAB — BRAIN NATRIURETIC PEPTIDE: B Natriuretic Peptide: 32.7 pg/mL (ref 0.0–100.0)

## 2020-11-14 LAB — CBC WITH DIFFERENTIAL/PLATELET
Abs Immature Granulocytes: 0.01 10*3/uL (ref 0.00–0.07)
Basophils Absolute: 0 10*3/uL (ref 0.0–0.1)
Basophils Relative: 1 %
Eosinophils Absolute: 0 10*3/uL (ref 0.0–0.5)
Eosinophils Relative: 1 %
HCT: 41.9 % (ref 36.0–46.0)
Hemoglobin: 14 g/dL (ref 12.0–15.0)
Immature Granulocytes: 0 %
Lymphocytes Relative: 36 %
Lymphs Abs: 2.1 10*3/uL (ref 0.7–4.0)
MCH: 30.6 pg (ref 26.0–34.0)
MCHC: 33.4 g/dL (ref 30.0–36.0)
MCV: 91.5 fL (ref 80.0–100.0)
Monocytes Absolute: 0.3 10*3/uL (ref 0.1–1.0)
Monocytes Relative: 5 %
Neutro Abs: 3.5 10*3/uL (ref 1.7–7.7)
Neutrophils Relative %: 57 %
Platelets: 309 10*3/uL (ref 150–400)
RBC: 4.58 MIL/uL (ref 3.87–5.11)
RDW: 12.4 % (ref 11.5–15.5)
WBC: 6 10*3/uL (ref 4.0–10.5)
nRBC: 0 % (ref 0.0–0.2)

## 2020-11-14 LAB — RESP PANEL BY RT-PCR (FLU A&B, COVID) ARPGX2
Influenza A by PCR: NEGATIVE
Influenza B by PCR: NEGATIVE
SARS Coronavirus 2 by RT PCR: NEGATIVE

## 2020-11-14 LAB — TROPONIN I (HIGH SENSITIVITY)
Troponin I (High Sensitivity): 2 ng/L (ref <18)
Troponin I (High Sensitivity): 2 ng/L (ref <18)

## 2020-11-14 MED ORDER — PROCHLORPERAZINE EDISYLATE 10 MG/2ML IJ SOLN
10.0000 mg | Freq: Once | INTRAMUSCULAR | Status: AC
  Administered 2020-11-14: 10 mg via INTRAVENOUS
  Filled 2020-11-14: qty 2

## 2020-11-14 MED ORDER — IOHEXOL 350 MG/ML SOLN: 100.0000 mL | Freq: Once | INTRAVENOUS | Status: DC | PRN

## 2020-11-14 MED ORDER — KETOROLAC TROMETHAMINE 30 MG/ML IJ SOLN
30.0000 mg | Freq: Once | INTRAMUSCULAR | Status: DC
  Filled 2020-11-14: qty 1

## 2020-11-14 MED ORDER — IPRATROPIUM-ALBUTEROL 0.5-2.5 (3) MG/3ML IN SOLN
3.0000 mL | Freq: Once | RESPIRATORY_TRACT | Status: AC
  Administered 2020-11-14: 3 mL via RESPIRATORY_TRACT
  Filled 2020-11-14: qty 3

## 2020-11-14 MED ORDER — DIPHENHYDRAMINE HCL 50 MG/ML IJ SOLN
25.0000 mg | Freq: Once | INTRAMUSCULAR | Status: AC
  Administered 2020-11-14: 25 mg via INTRAVENOUS
  Filled 2020-11-14: qty 1

## 2020-11-14 NOTE — ED Triage Notes (Signed)
Pt arrives complaining of Shortness of breath x 1 week and chest pain down bilateral arms starting yesterday.

## 2020-11-14 NOTE — Discharge Instructions (Addendum)
Your work-up today was reassuring.  Your chest x-ray did not show any signs of infection.  Your Covid test was negative.  Follow-up with your primary care doctor.  If you do not have a primary care doctor, he can follow-up with the [facility] wellness center.  Return emergency department for any worsening shortness of breath, chest pain, fevers or any other worsening concerning symptoms.

## 2020-11-14 NOTE — ED Provider Notes (Signed)
[facility] EMERGENCY DEPARTMENT Provider Note   CSN: [id number] Arrival date & time: 11/14/20  1047     History Chief Complaint  Patient presents with  . Shortness of Breath    Heather [name] is a 59 y.o. female possible history of dementia, GI bleed, hypertension who presents for evaluation of shortness of breath.  She feels like it has been ongoing for about a week or so but feels like it worsened in the last day.  She states yesterday, she felt like she was having chest chest pain.  She describes it as a soreness across her chest and into her arms.  It is worse when she moves, lays on her side.  She states that shortness of breath is better if she lays flat for still.  It is worse when she gets up and starts walking around.  She noted that yesterday, she was having difficulty walking to the bathroom because she would get short so short of breath which is abnormal for her.  She states she has been diagnosed with "mild CHF" several years ago but states she is not on any medications for it.  She has some intermittent leg swelling at baseline but she states she has not noticed anything worse than normal.  She states she does not know her chest pain is worse with exertion.  It is not worse with deep inspiration.  It worse when you palpate the area when she coughs.  She states she has a dry cough.  She has not noted any fever.  She does report that she has been around her grandson who is had rhinorrhea.  He does attend daycare.  She has not been covered vaccinated.  No Covid exposure that she knows of.  She has had some ongoing GI issues that have been occurring since October but nothing new.  She does vape about 10 times a day. She denies any OCP use, recent immobilization, prior history of DVT/PE, recent surgery, new leg swelling, or long travel.  The history is provided by the patient.       Past Medical History:  Diagnosis Date  . Barrett's esophageal ulceration   . Chronic  back pain   . Dementia (HCC)   . GI bleed   . Hypertension   . Lumbar herniated disc   . Pancreatic lesion     Patient Active Problem List   Diagnosis Date Noted  . GI bleed 02/25/2012  . Black stool 02/25/2012  . Nausea & vomiting 02/25/2012  . Diarrhea 02/25/2012  . SBO (small bowel obstruction) (HCC) 02/25/2012  . Barrett esophagus 02/25/2012  . Leukocytosis 02/25/2012  . Hyponatremia 02/25/2012  . Anemia 02/25/2012    Past Surgical History:  Procedure Laterality Date  . ABDOMINAL HYSTERECTOMY  2003   with Left Salpingo  . 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 BACK SURGERY    . CESAREAN SECTION    . ESOPHAGOGASTRODUODENOSCOPY  02/26/2012   Procedure: ESOPHAGOGASTRODUODENOSCOPY (EGD);  Surgeon: Petra Kuba, MD;  Location: [facility];  Service: Endoscopy;  Laterality: N/A;  . LAPAROSCOPIC INCISIONAL / UMBILICAL / VENTRAL HERNIA REPAIR  2008  . LAPAROSCOPIC NISSEN FUNDOPLICATION  2000  . VENTRAL HERNIA REPAIR  2003     OB History    Gravida  6   Para  2   Term  2   Preterm      AB  4   Living  SAB  4   TAB      Ectopic      Multiple      Live Births              Family History  Problem Relation Age of Onset  . Heart failure Mother   . Diabetes Mother   . Asthma Mother   . Heart failure Father   . Heart attack Father   . Heart failure Sister   . Diabetes Sister   . Kidney failure Sister   . Heart failure Brother     Social History   Tobacco Use  . Smoking status: Former Smoker    Packs/day: 0.50    Types: Cigarettes  . Smokeless tobacco: Never Used  Vaping Use  . Vaping Use: Every day  Substance Use Topics  . Alcohol use: No  . Drug use: Yes    Types: Marijuana    Comment: uses up to 3x per month for pain as needed    Home Medications Prior to Admission medications   Medication Sig Start Date End Date Taking? Authorizing Provider  acetaminophen (TYLENOL) 325 MG tablet Take 650 mg by  mouth every 6 (six) hours as needed.    [provider]  ALPRAZolam Prudy Feeler(XANAX) 0.5 MG tablet Take 1 tablet by mouth 2 (two) times daily as needed. 12/24/19   [provider]  Buprenorphine HCl-Naloxone HCl 8-2 MG FILM Place 2 strips under the tongue daily at 8 pm. 07/02/20   [provider]  dicyclomine (BENTYL) 20 MG tablet Take 1 tablet (20 mg total) by mouth 4 (four) times daily -  before meals and at bedtime. 01/14/19   Cristina GongHammond, Elizabeth W, PA-C  ibuprofen (ADVIL,MOTRIN) 800 MG tablet Take 800 mg by mouth every 8 (eight) hours as needed.    [provider]  omeprazole (PRILOSEC) 20 MG capsule Take 1 capsule (20 mg total) by mouth daily. Patient taking differently: Take 20 mg by mouth 2 (two) times daily.  06/23/20 07/23/20  Hyman HopesVenter, Margaux, PA-C  ondansetron (ZOFRAN ODT) 4 MG disintegrating tablet Take 1 tablet (4 mg total) by mouth every 8 (eight) hours as needed for nausea or vomiting. 11/30/18   Liberty HandyGibbons, Claudia J, PA-C  QUEtiapine (SEROQUEL) 25 MG tablet Take 25 mg by mouth at bedtime. 06/30/20   [provider]  clonazePAM (KLONOPIN) 0.5 MG tablet Take 0.5 mg by mouth as needed.    02/25/12  [provider]    Allergies    Penicillins  Review of Systems   Review of Systems  Constitutional: Negative for fever.  Respiratory: Positive for cough and shortness of breath.   Cardiovascular: Positive for chest pain.  Gastrointestinal: Negative for abdominal pain, nausea and vomiting.  Genitourinary: Negative for dysuria and hematuria.  Neurological: Negative for headaches.  All other systems reviewed and are negative.   Physical Exam Updated Vital Signs BP (!) 142/79   Pulse 92   Temp 98.5 F (36.9 C) (Oral)   Resp 18   Ht 5\' 5"  (1.651 m)   Wt 65.3 kg   SpO2 98%   BMI 23.96 kg/m   Physical Exam Vitals and nursing note reviewed.  Constitutional:      Appearance: Normal appearance. She is well-developed.  HENT:     Head:  Normocephalic and atraumatic.  Eyes:     General: Lids are normal.     Conjunctiva/sclera: Conjunctivae normal.     Pupils: Pupils are equal, round, and reactive to light.  Cardiovascular:     Rate and Rhythm: Normal rate and regular rhythm.     Pulses: Normal pulses.     Heart sounds: Normal heart sounds. No murmur heard.  No friction rub. No gallop.   Pulmonary:     Effort: Pulmonary effort is normal. Tachypnea present.     Comments: Speaking in medium sentences.  She has slight tachypnea and increased work of breathing noted.  She has rales noted at bilateral bases but right greater than left. Chest:     Comments: Tenderness palpation noted anterior chest wall.  No deformity or crepitus noted. Abdominal:     Palpations: Abdomen is soft. Abdomen is not rigid.     Tenderness: There is no abdominal tenderness. There is no guarding.  Musculoskeletal:        General: Normal range of motion.     Cervical back: Full passive range of motion without pain.     Comments: Bilateral lower extremities are symmetric in appearance without any overlying warmth, erythema, edema.  Skin:    General: Skin is warm and dry.     Capillary Refill: Capillary refill takes less than 2 seconds.  Neurological:     Mental Status: She is alert and oriented to person, place, and time.  Psychiatric:        Speech: Speech normal.     ED Results / Procedures / Treatments   Labs (all labs ordered are listed, but only abnormal results are displayed) Labs Reviewed  COMPREHENSIVE METABOLIC PANEL - Abnormal; Notable for the following components:      Result Value   Potassium 3.4 (*)    Glucose, Bld 116 (*)    All other components within normal limits  RESP PANEL BY RT-PCR (FLU A&B, COVID) ARPGX2  CBC WITH DIFFERENTIAL/PLATELET  BRAIN NATRIURETIC PEPTIDE  URINALYSIS, ROUTINE W REFLEX MICROSCOPIC  TROPONIN I (HIGH SENSITIVITY)  TROPONIN I (HIGH SENSITIVITY)    EKG EKG Interpretation  Date/Time:  Saturday  November 14 2020 10:56:38 EST Ventricular Rate:  93 PR Interval:    QRS Duration: 102 QT Interval:  361 QTC Calculation: 449 R Axis:   55 Text Interpretation: Sinus rhythm Borderline short PR interval RSR' in V1 or V2, right VCD or RVH No significant change since last tracing Confirmed by Jacalyn Lefevre [phone]) on 11/14/2020 10:58:22 AM   Radiology DG Chest Portable 1 View  Result Date: 11/14/2020 CLINICAL DATA:  Shortness of breath and chest pain EXAM: PORTABLE CHEST 1 VIEW COMPARISON:  June 10, 2018 FINDINGS: There is slight left base atelectasis. Lungs elsewhere are clear. Heart size and pulmonary vascularity are normal. No adenopathy. No pneumothorax. No bone lesions. IMPRESSION: Slight left base atelectasis. Lungs elsewhere clear. Heart size normal. Electronically Signed   By: Bretta Bang III M.D.   On: 11/14/2020 11:34    Procedures Procedures (including critical care time)  Medications Ordered in ED Medications  ketorolac (TORADOL) 30 MG/ML injection 30 mg (30 mg Intravenous Not Given 11/14/20 1526)  ipratropium-albuterol (DUONEB) 0.5-2.5 (3) MG/3ML nebulizer solution 3 mL (3 mLs Nebulization Given 11/14/20 1237)  prochlorperazine (COMPAZINE) injection 10 mg (10 mg Intravenous Given 11/14/20 1314)  diphenhydrAMINE (BENADRYL) injection 25 mg (25 mg Intravenous Given 11/14/20 1315)    ED Course  I have reviewed the triage vital signs and the nursing notes.  Pertinent labs & imaging results that were available during my care of the patient were reviewed by me and considered in my medical decision making (see chart for details).  MDM Rules/Calculators/A&P                          60 year old female who presents for evaluation of shortness of breath x1 week.  Worsened in the last day.  Associated with chest soreness that goes down into her arms.  She has not noted any fever.  She has been around her grandson who attends daycare and has had a rhinorrhea.  She has not  been vaccinated for Covid.  She reports she had a "mild CHF" but is not on any medications for it.  On initial ED arrival, she is tachypneic, tachycardic, hypertensive.  She has some increased work of breathing noted and is speaking in medium sentences.  On lung exam, she has crackles noted to bilateral bases, right greater than left.  No obvious signs of fluid overload.  Question if this is infectious etiology.  She does state that she has a history of CHF but I do not see any signs of leg swelling.  Will obtain blood work, chest x-ray, Covid swab.  Initial troponin negative.  Covid is negative.  BNP is 32.7.  CMP shows potassium of 3.4.  BUN/creatinine within normal limits.  CBC shows no leukocytosis or anemia.  Chest x-ray unremarkable.    Reevaluation.  Patient feels improved slightly after breathing treatment here.  She still feels like she is having some shortness of breath.  She is also reporting a headache.  We will give her headache cocktail.  Given her reassuring work-up so far, will plan for D-dimer for evaluation given her dyspnea on exertion.  Patient reports feeling better after migraine cocktail.  Unfortunately, D-dimer hemolyzed.  Will redraw.  RN inform me that patient wanted to leave.  I went discussed with patient.  Patient states her ride is here and she feels better and she wants to leave.  She feels like her shortness of breath has improved.  I did discuss with her that she had a CTA of chest pending for evaluation of PE.  Patient understood that we can miss the diagnosis such as infection or blood clot in the lung without this imaging.  She wishes to leave anyway. RN Theodoro Kos was there to witness conversation. At this time patient exhibits full medical decision making capacity and appears clinically sober. Patient had ample opportunity for questions and discussion. All patient's questions were answered with full understanding. Strict return precautions discussed. Patient expresses  understanding and agreement to plan.   ARLISHA PATALANO was evaluated in Emergency Department on 11/14/2020 for the symptoms described in the history of present illness. She was evaluated in the context of the global COVID-19 pandemic, which necessitated consideration that the patient might be at risk for infection with the SARS-CoV-2 virus that causes COVID-19. Institutional protocols and algorithms that pertain to the evaluation of patients at risk for COVID-19 are in a state of rapid change based on information released by regulatory bodies including the CDC and federal and state organizations. These policies and algorithms were followed during the patient's care in the ED.  Portions of this note were generated with Scientist, clinical (histocompatibility and immunogenetics). Dictation errors may occur despite best attempts at proofreading.   Final Clinical Impression(s) / ED Diagnoses Final diagnoses:  SOB (shortness of breath)    Rx / DC Orders ED Discharge Orders    None       Rosana Hoes 11/14/20 1614    Jacalyn Lefevre, MD 11/15/20 [phone]

## 2020-11-28 ENCOUNTER — Encounter (HOSPITAL_COMMUNITY): Payer: Self-pay | Admitting: *Deleted

## 2020-11-28 ENCOUNTER — Other Ambulatory Visit: Payer: Self-pay

## 2020-11-28 ENCOUNTER — Emergency Department (HOSPITAL_COMMUNITY)
Admission: EM | Admit: 2020-11-28 | Discharge: 2020-11-28 | Disposition: A | Discharge status: Home / Self Care | Payer: Medicare Other | Attending: Emergency Medicine | Admitting: Emergency Medicine

## 2020-11-28 DIAGNOSIS — R0981 Nasal congestion: Secondary | ICD-10-CM | POA: Insufficient documentation

## 2020-11-28 DIAGNOSIS — R519 Headache, unspecified: Secondary | ICD-10-CM | POA: Diagnosis not present

## 2020-11-28 DIAGNOSIS — U071 COVID-19: Secondary | ICD-10-CM | POA: Insufficient documentation

## 2020-11-28 DIAGNOSIS — M791 Myalgia, unspecified site: Secondary | ICD-10-CM | POA: Insufficient documentation

## 2020-11-28 DIAGNOSIS — Z5321 Procedure and treatment not carried out due to patient leaving prior to being seen by health care provider: Secondary | ICD-10-CM | POA: Diagnosis not present

## 2020-11-28 HISTORY — DX: COVID-19: U07.1

## 2020-11-28 NOTE — ED Triage Notes (Signed)
The other day with runny nose, c/o HA, body aches and low grade temperature.   Pt with known exposure to Covid, at home test this morning and was positive.

## 2021-05-11 ENCOUNTER — Emergency Department (HOSPITAL_BASED_OUTPATIENT_CLINIC_OR_DEPARTMENT_OTHER)
Admission: EM | Admit: 2021-05-11 | Discharge: 2021-05-11 | Disposition: A | Discharge status: Home / Self Care | Payer: Medicare Other | Attending: Emergency Medicine | Admitting: Emergency Medicine

## 2021-05-11 ENCOUNTER — Emergency Department (HOSPITAL_BASED_OUTPATIENT_CLINIC_OR_DEPARTMENT_OTHER): Payer: Medicare Other

## 2021-05-11 ENCOUNTER — Encounter (HOSPITAL_BASED_OUTPATIENT_CLINIC_OR_DEPARTMENT_OTHER): Payer: Self-pay | Admitting: Emergency Medicine

## 2021-05-11 ENCOUNTER — Other Ambulatory Visit: Payer: Self-pay

## 2021-05-11 ENCOUNTER — Other Ambulatory Visit (HOSPITAL_BASED_OUTPATIENT_CLINIC_OR_DEPARTMENT_OTHER): Payer: Self-pay

## 2021-05-11 DIAGNOSIS — R079 Chest pain, unspecified: Secondary | ICD-10-CM | POA: Diagnosis present

## 2021-05-11 DIAGNOSIS — Z79899 Other long term (current) drug therapy: Secondary | ICD-10-CM | POA: Diagnosis not present

## 2021-05-11 DIAGNOSIS — E876 Hypokalemia: Secondary | ICD-10-CM | POA: Diagnosis not present

## 2021-05-11 DIAGNOSIS — I10 Essential (primary) hypertension: Secondary | ICD-10-CM | POA: Diagnosis not present

## 2021-05-11 DIAGNOSIS — M62838 Other muscle spasm: Secondary | ICD-10-CM | POA: Diagnosis not present

## 2021-05-11 DIAGNOSIS — Z87891 Personal history of nicotine dependence: Secondary | ICD-10-CM | POA: Diagnosis not present

## 2021-05-11 DIAGNOSIS — R0789 Other chest pain: Secondary | ICD-10-CM | POA: Diagnosis not present

## 2021-05-11 DIAGNOSIS — Z8616 Personal history of COVID-19: Secondary | ICD-10-CM | POA: Diagnosis not present

## 2021-05-11 DIAGNOSIS — F0390 Unspecified dementia without behavioral disturbance: Secondary | ICD-10-CM | POA: Diagnosis not present

## 2021-05-11 LAB — CBC WITH DIFFERENTIAL/PLATELET
Abs Immature Granulocytes: 0.02 10*3/uL (ref 0.00–0.07)
Basophils Absolute: 0 10*3/uL (ref 0.0–0.1)
Basophils Relative: 0 %
Eosinophils Absolute: 0.2 10*3/uL (ref 0.0–0.5)
Eosinophils Relative: 2 %
HCT: 41.7 % (ref 36.0–46.0)
Hemoglobin: 14.2 g/dL (ref 12.0–15.0)
Immature Granulocytes: 0 %
Lymphocytes Relative: 28 %
Lymphs Abs: 2.8 10*3/uL (ref 0.7–4.0)
MCH: 30.7 pg (ref 26.0–34.0)
MCHC: 34.1 g/dL (ref 30.0–36.0)
MCV: 90.3 fL (ref 80.0–100.0)
Monocytes Absolute: 0.9 10*3/uL (ref 0.1–1.0)
Monocytes Relative: 9 %
Neutro Abs: 6 10*3/uL (ref 1.7–7.7)
Neutrophils Relative %: 61 %
Platelets: 361 10*3/uL (ref 150–400)
RBC: 4.62 MIL/uL (ref 3.87–5.11)
RDW: 12.4 % (ref 11.5–15.5)
WBC: 9.8 10*3/uL (ref 4.0–10.5)
nRBC: 0 % (ref 0.0–0.2)

## 2021-05-11 LAB — COMPREHENSIVE METABOLIC PANEL
ALT: 12 U/L (ref 0–44)
AST: 14 U/L — ABNORMAL LOW (ref 15–41)
Albumin: 3.8 g/dL (ref 3.5–5.0)
Alkaline Phosphatase: 60 U/L (ref 38–126)
Anion gap: 9 (ref 5–15)
BUN: 5 mg/dL — ABNORMAL LOW (ref 6–20)
CO2: 30 mmol/L (ref 22–32)
Calcium: 9.2 mg/dL (ref 8.9–10.3)
Chloride: 96 mmol/L — ABNORMAL LOW (ref 98–111)
Creatinine, Ser: 0.7 mg/dL (ref 0.44–1.00)
GFR, Estimated: >60 mL/min (ref >60)
Glucose, Bld: 135 mg/dL — ABNORMAL HIGH (ref 70–99)
Potassium: 2.5 mmol/L — CL (ref 3.5–5.1)
Sodium: 135 mmol/L (ref 135–145)
Total Bilirubin: 0.3 mg/dL (ref 0.3–1.2)
Total Protein: 7.1 g/dL (ref 6.5–8.1)

## 2021-05-11 LAB — TROPONIN I (HIGH SENSITIVITY): Troponin I (High Sensitivity): <2 ng/L (ref <18)

## 2021-05-11 LAB — LIPASE, BLOOD: Lipase: 20 U/L (ref 11–51)

## 2021-05-11 MED ORDER — PANTOPRAZOLE SODIUM 20 MG PO TBEC: 20.0000 mg | DELAYED_RELEASE_TABLET | Freq: Once | ORAL | Status: DC

## 2021-05-11 MED ORDER — POTASSIUM CHLORIDE ER 20 MEQ PO TBCR
20.0000 meq | EXTENDED_RELEASE_TABLET | Freq: Two times a day (BID) | ORAL | 0 refills | Status: DC
  Filled 2021-05-11: qty 10, 5d supply, fill #0

## 2021-05-11 MED ORDER — PANTOPRAZOLE SODIUM 40 MG PO TBEC
40.0000 mg | DELAYED_RELEASE_TABLET | Freq: Once | ORAL | Status: AC
  Administered 2021-05-11: 40 mg via ORAL
  Filled 2021-05-11: qty 1

## 2021-05-11 MED ORDER — POTASSIUM CHLORIDE 10 MEQ/100ML IV SOLN
10.0000 meq | Freq: Once | INTRAVENOUS | Status: AC
  Administered 2021-05-11: 10 meq via INTRAVENOUS
  Filled 2021-05-11: qty 100

## 2021-05-11 MED ORDER — POTASSIUM CHLORIDE CRYS ER 20 MEQ PO TBCR
40.0000 meq | EXTENDED_RELEASE_TABLET | Freq: Once | ORAL | Status: AC
  Administered 2021-05-11: 40 meq via ORAL
  Filled 2021-05-11: qty 2

## 2021-05-11 MED ORDER — FENTANYL CITRATE (PF) 100 MCG/2ML IJ SOLN
50.0000 ug | Freq: Once | INTRAMUSCULAR | Status: AC
  Administered 2021-05-11: 50 ug via INTRAVENOUS
  Filled 2021-05-11: qty 2

## 2021-05-11 MED ORDER — SODIUM CHLORIDE 0.9 % IV SOLN: INTRAVENOUS | Status: DC | PRN

## 2021-05-11 MED ORDER — MAGNESIUM SULFATE 2 GM/50ML IV SOLN
2.0000 g | Freq: Once | INTRAVENOUS | Status: AC
  Administered 2021-05-11: 2 g via INTRAVENOUS
  Filled 2021-05-11: qty 50

## 2021-05-11 NOTE — Discharge Instructions (Addendum)
Take potassium as prescribed.  Recommend eating some bananas as well.  Continue Tylenol.  Suspect that issues will resolve on their own.

## 2021-05-11 NOTE — ED Provider Notes (Signed)
[facility] EMERGENCY DEPARTMENT Provider Note   CSN: [id number] Arrival date & time: 05/11/21  0941     History Chief Complaint  Patient presents with  . Chest Pain    Heather [name] is a 60 y.o. female.  The history is provided by the patient.  Chest Pain Pain location:  L chest (Left side neck to left chest pain, has had spasms in neck area and left shoulder for a few days and now into chest.) Pain severity:  Mild Onset quality:  Gradual Timing:  Intermittent Progression:  Waxing and waning Chronicity:  New Context: movement   Relieved by:  Nothing Worsened by:  Nothing Associated symptoms: no abdominal pain, no back pain, no cough, no dizziness, no fever, no headache, no numbness, no palpitations, no shortness of breath, no vomiting and no weakness   Risk factors: hypertension   Risk factors: no coronary artery disease and no high cholesterol        Past Medical History:  Diagnosis Date  . Barrett's esophageal ulceration   . Chronic back pain   . COVID   . Dementia (HCC)   . GI bleed   . Hypertension   . Lumbar herniated disc   . Pancreatic lesion     Patient Active Problem List   Diagnosis Date Noted  . GI bleed 02/25/2012  . Black stool 02/25/2012  . Nausea & vomiting 02/25/2012  . Diarrhea 02/25/2012  . SBO (small bowel obstruction) (HCC) 02/25/2012  . Barrett esophagus 02/25/2012  . Leukocytosis 02/25/2012  . Hyponatremia 02/25/2012  . Anemia 02/25/2012    Past Surgical History:  Procedure Laterality Date  . ABDOMINAL HYSTERECTOMY  2003   with Left Salpingo  . ABDOMINAL SURGERY     at age 64 for malrotation; appendectomy  . BACK SURGERY    . CESAREAN SECTION    . ESOPHAGOGASTRODUODENOSCOPY  02/26/2012   Procedure: ESOPHAGOGASTRODUODENOSCOPY (EGD);  Surgeon: Petra Kuba, MD;  Location: [facility];  Service: Endoscopy;  Laterality: N/A;  . LAPAROSCOPIC INCISIONAL / UMBILICAL / VENTRAL HERNIA REPAIR  2008  . LAPAROSCOPIC NISSEN  FUNDOPLICATION  2000  . VENTRAL HERNIA REPAIR  2003     OB History    Gravida  6   Para  2   Term  2   Preterm      AB  4   Living        SAB  4   IAB      Ectopic      Multiple      Live Births              Family History  Problem Relation Age of Onset  . Heart failure Mother   . Diabetes Mother   . Asthma Mother   . Heart failure Father   . Heart attack Father   . Heart failure Sister   . Diabetes Sister   . Kidney failure Sister   . Heart failure Brother     Social History   Tobacco Use  . Smoking status: Former Smoker    Packs/day: 0.50    Types: Cigarettes  . Smokeless tobacco: Never Used  Vaping Use  . Vaping Use: Every day  Substance Use Topics  . Alcohol use: No  . Drug use: Yes    Types: Marijuana    Comment: uses up to 3x per month for pain as needed    Home Medications Prior to Admission medications   Medication Sig Start Date  End Date Taking? Authorizing Provider  potassium chloride 20 MEQ TBCR Take 20 mEq by mouth 2 (two) times daily for 5 days. 05/11/21 05/16/21 Yes Fallen Crisostomo, DO  acetaminophen (TYLENOL) 325 MG tablet Take 650 mg by mouth every 6 (six) hours as needed.    [provider]  ALPRAZolam Prudy Feeler) 0.5 MG tablet Take 1 tablet by mouth 2 (two) times daily as needed. 12/24/19   [provider]  Buprenorphine HCl-Naloxone HCl 8-2 MG FILM Place 2 strips under the tongue daily at 8 pm. 07/02/20   [provider]  dicyclomine (BENTYL) 20 MG tablet Take 1 tablet (20 mg total) by mouth 4 (four) times daily -  before meals and at bedtime. 01/14/19   Cristina Gong, PA-C  ibuprofen (ADVIL,MOTRIN) 800 MG tablet Take 800 mg by mouth every 8 (eight) hours as needed.    [provider]  omeprazole (PRILOSEC) 20 MG capsule Take 1 capsule (20 mg total) by mouth daily. Patient taking differently: Take 20 mg by mouth 2 (two) times daily.  06/23/20 07/23/20  Hyman Hopes, Margaux, PA-C  ondansetron (ZOFRAN  ODT) 4 MG disintegrating tablet Take 1 tablet (4 mg total) by mouth every 8 (eight) hours as needed for nausea or vomiting. 11/30/18   Liberty Handy, PA-C  QUEtiapine (SEROQUEL) 25 MG tablet Take 25 mg by mouth at bedtime. 06/30/20   [provider]  clonazePAM (KLONOPIN) 0.5 MG tablet Take 0.5 mg by mouth as needed.    02/25/12  [provider]    Allergies    Penicillins  Review of Systems   Review of Systems  Constitutional: Negative for chills and fever.  HENT: Negative for ear pain and sore throat.   Eyes: Negative for pain and visual disturbance.  Respiratory: Negative for cough and shortness of breath.   Cardiovascular: Positive for chest pain. Negative for palpitations.  Gastrointestinal: Negative for abdominal pain and vomiting.  Genitourinary: Negative for dysuria and hematuria.  Musculoskeletal: Positive for arthralgias, neck pain and neck stiffness. Negative for back pain.  Skin: Negative for color change and rash.  Neurological: Negative for dizziness, tremors, seizures, syncope, facial asymmetry, speech difficulty, weakness, light-headedness, numbness and headaches.  All other systems reviewed and are negative.   Physical Exam Updated Vital Signs  ED Triage Vitals  Enc Vitals Group     BP 05/11/21 0951 (!) 148/90     Pulse Rate 05/11/21 0951 92     Resp 05/11/21 0951 (!) 22     Temp 05/11/21 0951 98.2 F (36.8 C)     Temp src --      SpO2 05/11/21 0951 100 %     Weight 05/11/21 0950 148 lb (67.1 kg)     Height 05/11/21 0950 5\' 5"  (1.651 m)     Head Circumference --      Peak Flow --      Pain Score 05/11/21 0949 8     Pain Loc --      Pain Edu? --      Excl. in GC? --     Physical Exam Vitals and nursing note reviewed.  Constitutional:      General: She is not in acute distress.    Appearance: She is well-developed. She is not ill-appearing.  HENT:     Head: Normocephalic and atraumatic.  Eyes:     Conjunctiva/sclera: Conjunctivae  normal.     Pupils: Pupils are equal, round, and reactive to light.  Cardiovascular:  Rate and Rhythm: Normal rate and regular rhythm.     Pulses:          Radial pulses are 2+ on the right side and 2+ on the left side.     Heart sounds: Normal heart sounds. No murmur heard.   Pulmonary:     Effort: Pulmonary effort is normal. No respiratory distress.     Breath sounds: Normal breath sounds. No decreased breath sounds, wheezing, rhonchi or rales.  Chest:     Chest wall: Tenderness present.     Comments: Tenderness to left trapezius area, left paraspinal muscles, left-sided chest wall Abdominal:     Palpations: Abdomen is soft.     Tenderness: There is no abdominal tenderness.  Musculoskeletal:        General: Normal range of motion.     Cervical back: Normal range of motion and neck supple.     Right lower leg: No edema.     Left lower leg: No edema.  Skin:    General: Skin is warm and dry.     Capillary Refill: Capillary refill takes less than 2 seconds.  Neurological:     General: No focal deficit present.     Mental Status: She is alert.  Psychiatric:        Mood and Affect: Mood normal.     ED Results / Procedures / Treatments   Labs (all labs ordered are listed, but only abnormal results are displayed) Labs Reviewed  COMPREHENSIVE METABOLIC PANEL - Abnormal; Notable for the following components:      Result Value   Potassium 2.5 (*)    Chloride 96 (*)    Glucose, Bld 135 (*)    BUN 5 (*)    AST 14 (*)    All other components within normal limits  CBC WITH DIFFERENTIAL/PLATELET  LIPASE, BLOOD  TROPONIN I (HIGH SENSITIVITY)    EKG EKG Interpretation  Date/Time:  Tuesday May 11 2021 09:52:46 EDT Ventricular Rate:  88 PR Interval:  131 QRS Duration: 105 QT Interval:  419 QTC Calculation: 507 R Axis:   23 Text Interpretation: Sinus rhythm No significant change since last tracing Borderline prolonged QT interval Confirmed by Virgina Norfolk ([id number]) on  05/11/2021 10:00:17 AM   Radiology DG Chest Portable 1 View  Result Date: 05/11/2021 CLINICAL DATA:  Back pain. EXAM: PORTABLE CHEST 1 VIEW COMPARISON:  Chest x-ray 11/14/2020. FINDINGS: Mediastinum hilar structures normal. Heart size normal. Mild left base subsegmental atelectasis. Mild right base pleural-parenchymal thickening consistent with scarring. No acute bony abnormality. IMPRESSION: Mild left base subsegmental atelectasis. Mild right base pleural-parenchymal thickening most likely secondary to scarring. Electronically Signed   By: Maisie Fus  Register   On: 05/11/2021 10:57    Procedures Procedures   Medications Ordered in ED Medications  potassium chloride 10 mEq in 100 mL IVPB (10 mEq Intravenous New Bag/Given 05/11/21 1150)  potassium chloride SA (KLOR-CON) CR tablet 40 mEq (has no administration in time range)  magnesium sulfate IVPB 2 g 50 mL (has no administration in time range)  0.9 %  sodium chloride infusion ( Intravenous New Bag/Given 05/11/21 1146)  fentaNYL (SUBLIMAZE) injection 50 mcg (has no administration in time range)    ED Course  I have reviewed the triage vital signs and the nursing notes.  Pertinent labs & imaging results that were available during my care of the patient were reviewed by me and considered in my medical decision making (see chart for details).    MDM Rules/Calculators/A&P  Heather [name] is a 60 year old female with history of hypertension, chronic back pain who presents the ED with chest pain.  Normal vitals.  No fever.  Overall muscular sounding chest pain.  Having some spasms in her left sided neck muscles as well as her left trapezius now into her chest wall.  Reproducible on exam.  EKG shows sinus rhythm.  Troponin is normal and doubt ACS.  No respiratory symptoms and no concern for PE.  Potassium 2.5 but otherwise lab work is unremarkable.  Given IV repletion and will start on oral potassium.  Likely suspect that  this is causing muscle spasms.  Discharged in good condition.  Understands return precautions.  This chart was dictated using voice recognition software.  Despite best efforts to proofread,  errors can occur which can change the documentation meaning.    Final Clinical Impression(s) / ED Diagnoses Final diagnoses:  Muscle spasm  Atypical chest pain  Hypokalemia    Rx / DC Orders ED Discharge Orders         Ordered    potassium chloride 20 MEQ TBCR  2 times daily        05/11/21 1150           Elohim Cityuratolo, Marja Adderley, DO 05/11/21 1151

## 2021-05-11 NOTE — ED Triage Notes (Signed)
Pt arrives pov with driver, reports chronic back pain, new onset x 2 days with radiation to left jaw and arm.Pt endorses N/V x 3 weeks with difficulty swallowing food. Pt ambulatory into ED, to triage in wheelchair

## 2021-05-11 NOTE — ED Notes (Signed)
Pt ambulatory to restroom without assistance 

## 2021-06-18 ENCOUNTER — Emergency Department (HOSPITAL_BASED_OUTPATIENT_CLINIC_OR_DEPARTMENT_OTHER): Payer: Medicare Other

## 2021-06-18 ENCOUNTER — Emergency Department (HOSPITAL_BASED_OUTPATIENT_CLINIC_OR_DEPARTMENT_OTHER)
Admission: EM | Admit: 2021-06-18 | Discharge: 2021-06-18 | Disposition: A | Discharge status: Home / Self Care | Payer: Medicare Other | Attending: Emergency Medicine | Admitting: Emergency Medicine

## 2021-06-18 ENCOUNTER — Encounter (HOSPITAL_BASED_OUTPATIENT_CLINIC_OR_DEPARTMENT_OTHER): Payer: Self-pay

## 2021-06-18 ENCOUNTER — Other Ambulatory Visit: Payer: Self-pay

## 2021-06-18 DIAGNOSIS — R1084 Generalized abdominal pain: Secondary | ICD-10-CM | POA: Diagnosis present

## 2021-06-18 DIAGNOSIS — F17210 Nicotine dependence, cigarettes, uncomplicated: Secondary | ICD-10-CM | POA: Diagnosis not present

## 2021-06-18 DIAGNOSIS — F0390 Unspecified dementia without behavioral disturbance: Secondary | ICD-10-CM | POA: Insufficient documentation

## 2021-06-18 DIAGNOSIS — R630 Anorexia: Secondary | ICD-10-CM | POA: Insufficient documentation

## 2021-06-18 DIAGNOSIS — I10 Essential (primary) hypertension: Secondary | ICD-10-CM | POA: Insufficient documentation

## 2021-06-18 DIAGNOSIS — Z8616 Personal history of COVID-19: Secondary | ICD-10-CM | POA: Insufficient documentation

## 2021-06-18 LAB — URINALYSIS, ROUTINE W REFLEX MICROSCOPIC
Bilirubin Urine: NEGATIVE
Glucose, UA: NEGATIVE mg/dL
Hgb urine dipstick: NEGATIVE
Ketones, ur: NEGATIVE mg/dL
Leukocytes,Ua: NEGATIVE
Nitrite: NEGATIVE
Protein, ur: NEGATIVE mg/dL
Specific Gravity, Urine: >1.03 — ABNORMAL HIGH (ref 1.005–1.030)
pH: 6 (ref 5.0–8.0)

## 2021-06-18 LAB — COMPREHENSIVE METABOLIC PANEL
ALT: 14 U/L (ref 0–44)
AST: 17 U/L (ref 15–41)
Albumin: 4.3 g/dL (ref 3.5–5.0)
Alkaline Phosphatase: 65 U/L (ref 38–126)
Anion gap: 9 (ref 5–15)
BUN: 21 mg/dL (ref 8–23)
CO2: 30 mmol/L (ref 22–32)
Calcium: 9.3 mg/dL (ref 8.9–10.3)
Chloride: 98 mmol/L (ref 98–111)
Creatinine, Ser: 0.76 mg/dL (ref 0.44–1.00)
GFR, Estimated: >60 mL/min (ref >60)
Glucose, Bld: 123 mg/dL — ABNORMAL HIGH (ref 70–99)
Potassium: 3.4 mmol/L — ABNORMAL LOW (ref 3.5–5.1)
Sodium: 137 mmol/L (ref 135–145)
Total Bilirubin: 0.3 mg/dL (ref 0.3–1.2)
Total Protein: 7.2 g/dL (ref 6.5–8.1)

## 2021-06-18 LAB — CBC
HCT: 45.5 % (ref 36.0–46.0)
Hemoglobin: 15.6 g/dL — ABNORMAL HIGH (ref 12.0–15.0)
MCH: 30.4 pg (ref 26.0–34.0)
MCHC: 34.3 g/dL (ref 30.0–36.0)
MCV: 88.7 fL (ref 80.0–100.0)
Platelets: 487 10*3/uL — ABNORMAL HIGH (ref 150–400)
RBC: 5.13 MIL/uL — ABNORMAL HIGH (ref 3.87–5.11)
RDW: 12.8 % (ref 11.5–15.5)
WBC: 13 10*3/uL — ABNORMAL HIGH (ref 4.0–10.5)
nRBC: 0 % (ref 0.0–0.2)

## 2021-06-18 LAB — LIPASE, BLOOD: Lipase: 46 U/L (ref 11–51)

## 2021-06-18 MED ORDER — OXYCODONE-ACETAMINOPHEN 5-325 MG PO TABS
2.0000 | ORAL_TABLET | Freq: Once | ORAL | Status: AC
Start: 2021-06-18 — End: 2021-06-18
  Administered 2021-06-18: 2 via ORAL
  Filled 2021-06-18: qty 2

## 2021-06-18 MED ORDER — IOHEXOL 300 MG/ML  SOLN
100.0000 mL | Freq: Once | INTRAMUSCULAR | Status: AC | PRN
  Administered 2021-06-18: 100 mL via INTRAVENOUS

## 2021-06-18 MED ORDER — OXYCODONE-ACETAMINOPHEN 5-325 MG PO TABS: 1.0000 | ORAL_TABLET | ORAL | 0 refills | Status: AC | PRN

## 2021-06-18 MED ORDER — ONDANSETRON 4 MG PO TBDP
4.0000 mg | ORAL_TABLET | Freq: Once | ORAL | Status: AC
  Administered 2021-06-18: 4 mg via ORAL
  Filled 2021-06-18: qty 1

## 2021-06-18 MED ORDER — ONDANSETRON 8 MG PO TBDP: 8.0000 mg | ORAL_TABLET | Freq: Three times a day (TID) | ORAL | 0 refills | Status: AC | PRN

## 2021-06-18 NOTE — ED Notes (Signed)
Pt given ice chips

## 2021-06-18 NOTE — ED Notes (Signed)
Pt ambulatory with steady gait to restroom. Able to obtain sample.

## 2021-06-18 NOTE — ED Provider Notes (Signed)
[facility] EMERGENCY DEPARTMENT Provider Note   CSN: [id number] Arrival date & time: 06/18/21  1220     History Chief Complaint  Patient presents with   Abdominal Pain    Heather [name] is a 60 y.o. female.  Pt complains of abdominal pain   The history is provided by the patient. No language interpreter was used.  Abdominal Pain Pain location:  Generalized Pain quality: aching   Pain radiates to:  Does not radiate Pain severity:  Moderate Onset quality:  Gradual Timing:  Constant Progression:  Worsening Chronicity:  New Relieved by:  Nothing Worsened by:  Nothing Ineffective treatments:  None tried Associated symptoms: anorexia   Risk factors: multiple surgeries   Risk factors: no alcohol abuse   Pt has had multiple abdominal surgeries.  Pt reports difficult eating or drinking due to nausea.  Pt reports she also saw things swimming in her toliet after her bowel movements.  Pt reports they look like maggots.  Pt reports toliet drain is also partially clogged.      Past Medical History:  Diagnosis Date   Barrett's esophageal ulceration    Chronic back pain    COVID    Dementia (HCC)    GI bleed    Hypertension    Lumbar herniated disc    Pancreatic lesion     Patient Active Problem List   Diagnosis Date Noted   GI bleed 02/25/2012   Black stool 02/25/2012   Nausea & vomiting 02/25/2012   Diarrhea 02/25/2012   SBO (small bowel obstruction) (HCC) 02/25/2012   Barrett esophagus 02/25/2012   Leukocytosis 02/25/2012   Hyponatremia 02/25/2012   Anemia 02/25/2012    Past Surgical History:  Procedure Laterality Date   ABDOMINAL HYSTERECTOMY  2003   with Left Salpingo   ABDOMINAL SURGERY     at age 24 for malrotation; appendectomy   BACK SURGERY     CESAREAN SECTION     ESOPHAGOGASTRODUODENOSCOPY  02/26/2012   Procedure: ESOPHAGOGASTRODUODENOSCOPY (EGD);  Surgeon: Petra Kuba, MD;  Location: [facility];  Service: Endoscopy;  Laterality: N/A;    LAPAROSCOPIC INCISIONAL / UMBILICAL / VENTRAL HERNIA REPAIR  2008   LAPAROSCOPIC NISSEN FUNDOPLICATION  2000   VENTRAL HERNIA REPAIR  2003     OB History     Gravida  6   Para  2   Term  2   Preterm      AB  4   Living         SAB  4   IAB      Ectopic      Multiple      Live Births              Family History  Problem Relation Age of Onset   Heart failure Mother    Diabetes Mother    Asthma Mother    Heart failure Father    Heart attack Father    Heart failure Sister    Diabetes Sister    Kidney failure Sister    Heart failure Brother     Social History   Tobacco Use   Smoking status: Every Day    Packs/day: 0.50    Pack years: 0.00    Types: Cigarettes   Smokeless tobacco: Never  Vaping Use   Vaping Use: Every day  Substance Use Topics   Alcohol use: No   Drug use: Yes    Types: Marijuana    Home Medications Prior to  Admission medications   Medication Sig Start Date End Date Taking? Authorizing Provider  acetaminophen (TYLENOL) 325 MG tablet Take 650 mg by mouth every 6 (six) hours as needed.    [provider]  ALPRAZolam Prudy Feeler) 0.5 MG tablet Take 1 tablet by mouth 2 (two) times daily as needed. 12/24/19   [provider]  Buprenorphine HCl-Naloxone HCl 8-2 MG FILM Place 2 strips under the tongue daily at 8 pm. 07/02/20   [provider]  dicyclomine (BENTYL) 20 MG tablet Take 1 tablet (20 mg total) by mouth 4 (four) times daily -  before meals and at bedtime. 01/14/19   Cristina Gong, PA-C  ibuprofen (ADVIL,MOTRIN) 800 MG tablet Take 800 mg by mouth every 8 (eight) hours as needed.    [provider]  omeprazole (PRILOSEC) 20 MG capsule Take 1 capsule (20 mg total) by mouth daily. Patient taking differently: Take 20 mg by mouth 2 (two) times daily.  06/23/20 07/23/20  Hyman Hopes, Margaux, PA-C  ondansetron (ZOFRAN ODT) 4 MG disintegrating tablet Take 1 tablet (4 mg total) by mouth every 8 (eight) hours  as needed for nausea or vomiting. 11/30/18   Liberty Handy, PA-C  Potassium Chloride ER 20 MEQ TBCR Take 20 mEq (1 tablet) by mouth 2 (two) times daily for 5 days. 05/11/21 05/16/21  Curatolo, Adam, DO  QUEtiapine (SEROQUEL) 25 MG tablet Take 25 mg by mouth at bedtime. 06/30/20   [provider]  clonazePAM (KLONOPIN) 0.5 MG tablet Take 0.5 mg by mouth as needed.    02/25/12  [provider]    Allergies    Penicillins  Review of Systems   Review of Systems  Gastrointestinal:  Positive for abdominal pain and anorexia.  All other systems reviewed and are negative.  Physical Exam Updated Vital Signs BP (!) 152/75   Pulse 77   Temp 98.8 F (37.1 C) (Oral)   Resp 18   Ht 5\' 5"  (1.651 m)   Wt 64.4 kg   SpO2 100%   BMI 23.63 kg/m   Physical Exam Vitals and nursing note reviewed.  Constitutional:      Appearance: She is well-developed.  HENT:     Head: Normocephalic.  Cardiovascular:     Rate and Rhythm: Normal rate.  Pulmonary:     Effort: Pulmonary effort is normal.  Abdominal:     General: Abdomen is flat.     Palpations: Abdomen is soft.     Tenderness: There is abdominal tenderness.  Musculoskeletal:        General: Normal range of motion.     Cervical back: Normal range of motion.  Skin:    General: Skin is warm.  Neurological:     General: No focal deficit present.     Mental Status: She is alert and oriented to person, place, and time.    ED Results / Procedures / Treatments   Labs (all labs ordered are listed, but only abnormal results are displayed) Labs Reviewed  COMPREHENSIVE METABOLIC PANEL - Abnormal; Notable for the following components:      Result Value   Potassium 3.4 (*)    Glucose, Bld 123 (*)    All other components within normal limits  CBC - Abnormal; Notable for the following components:   WBC 13.0 (*)    RBC 5.13 (*)    Hemoglobin 15.6 (*)    Platelets 487 (*)    All other components within normal limits   URINALYSIS, ROUTINE W REFLEX  MICROSCOPIC - Abnormal; Notable for the following components:   Specific Gravity, Urine >1.030 (*)    All other components within normal limits  LIPASE, BLOOD    EKG None  Radiology CT ABDOMEN PELVIS W CONTRAST  Result Date: 06/18/2021 CLINICAL DATA:  Acute abdominal pain. EXAM: CT ABDOMEN AND PELVIS WITH CONTRAST TECHNIQUE: Multidetector CT imaging of the abdomen and pelvis was performed using the standard protocol following bolus administration of intravenous contrast. IV contrast infiltrated with approximately 50 cc of contrast. No contrast is seen in the intravascular system, this is essentially a noncontrast CT. Referring clinician notified of IV infiltrate. CONTRAST:  66mL OMNIPAQUE IOHEXOL 300 MG/ML  SOLN COMPARISON:  Contrast-enhanced exam 06/23/2020 FINDINGS: Lower chest: Coronary artery calcification or stent. No acute airspace disease or pleural effusion. Hepatobiliary: There is no focal hepatic abnormality. Physiologically distended gallbladder. No calcified gallstone or pericholecystic fat stranding. Distal common bile duct dilatation measuring 8 mm, similar to prior exam. Proximal common bile duct measures 7 mm. Pancreas: Diffuse pancreatic ductal dilatation is again seen, better appreciated on prior exam due to contrast. This measures approximately 6 mm in the pancreatic body. There is no acute peripancreatic fat stranding or inflammation. Spleen: Normal in size without focal abnormality. Adrenals/Urinary Tract: Normal adrenal glands. There is no hydronephrosis or perinephric edema. Left renal cyst is similar to prior exam. No renal calculi unremarkable urinary bladder. Stomach/Bowel: Postsurgical change at the gastroesophageal junction. No abnormal gastric distension. Duodenum does not cross the midline consistent with known intestinal malrotation. There is no small bowel obstruction or inflammation. Colon is located in the left abdomen. The cecum is located  in the deep pelvis in the midline. Lipomatous hypertrophy of the ileocecal valve. Prior appendectomy. High-density in the colon may be bismuth containing enteric contents or ingested contrast. There is no colonic inflammation. Mild colonic diverticulosis without diverticulitis. Vascular/Lymphatic: Mild aortic atherosclerosis. No aortic aneurysm. No portal venous or mesenteric gas. No enlarged lymph nodes in the abdomen or pelvis. Reproductive: Hysterectomy.  No adnexal mass. Other: Tacks in the anterior abdomen from prior abdominal wall hernia repair. Small fat containing supraumbilical hernia similar to prior just superficial to the mesh. No ascites, free air, or focal fluid collection. Musculoskeletal: Lumbar scoliosis with degenerative change in posterior fusion hardware at L5-S1. There are no acute or suspicious osseous abnormalities. Delayed phase imaging demonstrates IV contrast extravasation within the right arm soft tissues. IMPRESSION: 1. No acute abnormality or explanation for abdominal pain. 2. Similar appearance of diffuse pancreatic ductal dilatation, better appreciated on prior exam. No acute peripancreatic fat stranding or inflammation. 3. Stable appearance of known intestinal malrotation. No obstruction. 4. Mild colonic diverticulosis without diverticulitis. 5. IV contrast infiltrate in the right arm. There is no intravascular contrast, this is essentially an unenhanced exam. Aortic Atherosclerosis (ICD10-I70.0). Electronically Signed   By: Narda Rutherford M.D.   On: 06/18/2021 17:08    Procedures Procedures   Medications Ordered in ED Medications  iohexol (OMNIPAQUE) 300 MG/ML solution 100 mL (100 mLs Intravenous Contrast Given 06/18/21 1628)  oxyCODONE-acetaminophen (PERCOCET/ROXICET) 5-325 MG per tablet 2 tablet (2 tablets Oral Given 06/18/21 1655)  ondansetron (ZOFRAN-ODT) disintegrating tablet 4 mg (4 mg Oral Given 06/18/21 1655)    ED Course  I have reviewed the triage vital signs and  the nursing notes.  Pertinent labs & imaging results that were available during my care of the patient were reviewed by me and considered in my medical decision making (see chart for details).    MDM  Rules/Calculators/A&P                          Pt advised to see her MD for recheck. Pt given a bedpan and advised to obtain a  specimen.  Pt advised to have her MD send for testing if needed.  Pt has chronic abdominal pain.  I will give her an rx for zofran odt.  Pt given rx for percocet.  Final Clinical Impression(s) / ED Diagnoses Final diagnoses:  Generalized abdominal pain    Rx / DC Orders ED Discharge Orders          Ordered    oxyCODONE-acetaminophen (PERCOCET) 5-325 MG tablet  Every 4 hours PRN        06/18/21 1934    ondansetron (ZOFRAN ODT) 8 MG disintegrating tablet  Every 8 hours PRN        06/18/21 1934             Osie CheeksSofia, Montey Ebel K, PA-C 06/18/21 1940    Melene PlanFloyd, Dan, DO 06/18/21 2343

## 2021-06-18 NOTE — ED Triage Notes (Signed)
Pt c/o abd pain x 8 days-states she has been seen x 3 in the ED for same-NAD-to triage in w/c

## 2021-06-18 NOTE — ED Notes (Signed)
Returns from radiology, IV had been redressed, IV catheter was partially out, Radiology stated it was difficult to flush and attempted to readjust IV, IV dsg was removed and reapplied by radiology, IV site noted to be infiltrated and red, warm to touch, IV catheter immediately removed and dsg applied.

## 2021-06-18 NOTE — ED Notes (Signed)
Patient transported to CT 

## 2022-02-20 ENCOUNTER — Encounter (HOSPITAL_BASED_OUTPATIENT_CLINIC_OR_DEPARTMENT_OTHER): Payer: Self-pay | Admitting: Emergency Medicine

## 2022-02-20 ENCOUNTER — Emergency Department (HOSPITAL_BASED_OUTPATIENT_CLINIC_OR_DEPARTMENT_OTHER)
Admission: EM | Admit: 2022-02-20 | Discharge: 2022-02-20 | Disposition: A | Discharge status: Home / Self Care | Payer: Medicare Other | Attending: Emergency Medicine | Admitting: Emergency Medicine

## 2022-02-20 ENCOUNTER — Emergency Department (HOSPITAL_BASED_OUTPATIENT_CLINIC_OR_DEPARTMENT_OTHER): Payer: Medicare Other

## 2022-02-20 ENCOUNTER — Other Ambulatory Visit: Payer: Self-pay

## 2022-02-20 DIAGNOSIS — M79601 Pain in right arm: Secondary | ICD-10-CM | POA: Diagnosis not present

## 2022-02-20 DIAGNOSIS — M79602 Pain in left arm: Secondary | ICD-10-CM | POA: Diagnosis not present

## 2022-02-20 DIAGNOSIS — M542 Cervicalgia: Secondary | ICD-10-CM | POA: Diagnosis not present

## 2022-02-20 DIAGNOSIS — R519 Headache, unspecified: Secondary | ICD-10-CM

## 2022-02-20 DIAGNOSIS — H538 Other visual disturbances: Secondary | ICD-10-CM | POA: Diagnosis not present

## 2022-02-20 DIAGNOSIS — G8929 Other chronic pain: Secondary | ICD-10-CM

## 2022-02-20 LAB — COMPREHENSIVE METABOLIC PANEL
ALT: 13 U/L (ref 0–44)
AST: 16 U/L (ref 15–41)
Albumin: 3.5 g/dL (ref 3.5–5.0)
Alkaline Phosphatase: 47 U/L (ref 38–126)
Anion gap: 8 (ref 5–15)
BUN: 11 mg/dL (ref 8–23)
CO2: 27 mmol/L (ref 22–32)
Calcium: 8.8 mg/dL — ABNORMAL LOW (ref 8.9–10.3)
Chloride: 103 mmol/L (ref 98–111)
Creatinine, Ser: 0.64 mg/dL (ref 0.44–1.00)
GFR, Estimated: >60 mL/min (ref >60)
Glucose, Bld: 152 mg/dL — ABNORMAL HIGH (ref 70–99)
Potassium: 4.1 mmol/L (ref 3.5–5.1)
Sodium: 138 mmol/L (ref 135–145)
Total Bilirubin: 0.5 mg/dL (ref 0.3–1.2)
Total Protein: 5.8 g/dL — ABNORMAL LOW (ref 6.5–8.1)

## 2022-02-20 LAB — CBC WITH DIFFERENTIAL/PLATELET
Abs Immature Granulocytes: 0.01 10*3/uL (ref 0.00–0.07)
Basophils Absolute: 0.1 10*3/uL (ref 0.0–0.1)
Basophils Relative: 1 %
Eosinophils Absolute: 0.2 10*3/uL (ref 0.0–0.5)
Eosinophils Relative: 4 %
HCT: 40.6 % (ref 36.0–46.0)
Hemoglobin: 13.4 g/dL (ref 12.0–15.0)
Immature Granulocytes: 0 %
Lymphocytes Relative: 33 %
Lymphs Abs: 2.1 10*3/uL (ref 0.7–4.0)
MCH: 30.3 pg (ref 26.0–34.0)
MCHC: 33 g/dL (ref 30.0–36.0)
MCV: 91.9 fL (ref 80.0–100.0)
Monocytes Absolute: 0.4 10*3/uL (ref 0.1–1.0)
Monocytes Relative: 6 %
Neutro Abs: 3.6 10*3/uL (ref 1.7–7.7)
Neutrophils Relative %: 56 %
Platelets: 265 10*3/uL (ref 150–400)
RBC: 4.42 MIL/uL (ref 3.87–5.11)
RDW: 12.4 % (ref 11.5–15.5)
WBC: 6.3 10*3/uL (ref 4.0–10.5)
nRBC: 0 % (ref 0.0–0.2)

## 2022-02-20 LAB — CK: Total CK: 71 U/L (ref 38–234)

## 2022-02-20 MED ORDER — FENTANYL CITRATE PF 50 MCG/ML IJ SOSY
50.0000 ug | PREFILLED_SYRINGE | Freq: Once | INTRAMUSCULAR | Status: AC
  Administered 2022-02-20: 50 ug via INTRAVENOUS
  Filled 2022-02-20: qty 1

## 2022-02-20 MED ORDER — IOHEXOL 350 MG/ML SOLN
100.0000 mL | Freq: Once | INTRAVENOUS | Status: AC | PRN
Start: 2022-02-20 — End: 2022-02-20
  Administered 2022-02-20: 100 mL via INTRAVENOUS

## 2022-02-20 MED ORDER — LACTATED RINGERS IV BOLUS
1000.0000 mL | Freq: Once | INTRAVENOUS | Status: AC
  Administered 2022-02-20: 1000 mL via INTRAVENOUS

## 2022-02-20 MED ORDER — ACETAMINOPHEN 500 MG PO TABS
1000.0000 mg | ORAL_TABLET | Freq: Once | ORAL | Status: AC
  Administered 2022-02-20: 1000 mg via ORAL
  Filled 2022-02-20: qty 2

## 2022-02-20 NOTE — ED Provider Notes (Signed)
Patient has had CT angio head and neck which was pending at the time I took over patient without any acute findings.  Patient overall feeling better.  Labs without any significant abnormalities.  Patient on chronic pain medicine.  Patient stable for discharge and follow-up with primary care provider.  If headache symptoms persist MRI brain would be indicated.  But does not need an emergent MRI today. ?  ?Vanetta Mulders, MD ?02/20/22 1705 ? ?

## 2022-02-20 NOTE — ED Triage Notes (Signed)
Pt arrives pov, ambulatory with cane, reports hx of spinal issues, worsening pain and weakness in left hand, worsening back pain x 4 days. Also c/o HA ?

## 2022-02-20 NOTE — ED Notes (Signed)
Pt sts she has had episodes with her LT hand cramping so severely that she is unable to use it; last one lasted about 1 hr ?

## 2022-02-20 NOTE — ED Provider Notes (Signed)
?[facility] EMERGENCY DEPARTMENT ?Provider Note ? ? ?CSN: [id number] ?Arrival date & time: 02/20/22  1102 ? ?  ? ?History ? ?Chief Complaint  ?Patient presents with  ? Headache  ? ? ?Heather [name] is a 62 y.o. female. ? ?HPI ?61 year old female presents with a chief complaint of neck pain and arm pain.  States that this started around 3/1.  At that time she noticed some transient visual complaints which included some blurry vision for about an hour.  She has had some moderate headache since but primarily is having posterior neck pain at the base of her neck.  She has had cervical radiculopathy before and this feels kind of similar but is more constant and has not gone away.  When this first started her hands were cramping and hard to move and it feels like they are about to cramp and have been continuously hurting ever since.  No numbness.  She feels generally weak but worse in her arms.  Her arm pain feels like an aching sensation and a burning sensation.  She is taken some Tylenol, which she was recently took around 6 AM.  No fevers or current visual complaints.  No chest pain or shortness of breath.  Sometimes when she walks, while walking with a cane, she will feel off balance transiently but is not every time. ? ?Home Medications ?Prior to Admission medications   ?Medication Sig Start Date End Date Taking? Authorizing Provider  ?acetaminophen (TYLENOL) 325 MG tablet Take 650 mg by mouth every 6 (six) hours as needed.    [provider]  ?ALPRAZolam Prudy Feeler) 0.5 MG tablet Take 1 tablet by mouth 2 (two) times daily as needed. 12/24/19   [provider]  ?Buprenorphine HCl-Naloxone HCl 8-2 MG FILM Place 2 strips under the tongue daily at 8 pm. 07/02/20   [provider]  ?dicyclomine (BENTYL) 20 MG tablet Take 1 tablet (20 mg total) by mouth 4 (four) times daily -  before meals and at bedtime. 01/14/19   Cristina Gong, PA-C  ?ibuprofen (ADVIL,MOTRIN) 800 MG tablet Take 800  mg by mouth every 8 (eight) hours as needed.    [provider]  ?omeprazole (PRILOSEC) 20 MG capsule Take 1 capsule (20 mg total) by mouth daily. ?Patient taking differently: Take 20 mg by mouth 2 (two) times daily.  06/23/20 07/23/20  Tanda Rockers, PA-C  ?ondansetron (ZOFRAN ODT) 8 MG disintegrating tablet Take 1 tablet (8 mg total) by mouth every 8 (eight) hours as needed for nausea or vomiting. 06/18/21   Elson Areas, PA-C  ?oxyCODONE-acetaminophen (PERCOCET) 5-325 MG tablet Take 1 tablet by mouth every 4 (four) hours as needed for severe pain. 06/18/21 06/18/22  Elson Areas, PA-C  ?Potassium Chloride ER 20 MEQ TBCR Take 20 mEq (1 tablet) by mouth 2 (two) times daily for 5 days. 05/11/21 05/16/21  Virgina Norfolk, DO  ?QUEtiapine (SEROQUEL) 25 MG tablet Take 25 mg by mouth at bedtime. 06/30/20   [provider]  ?clonazePAM (KLONOPIN) 0.5 MG tablet Take 0.5 mg by mouth as needed.    02/25/12  [provider]  ?   ? ?Allergies    ?Penicillins   ? ?Review of Systems   ?Review of Systems  ?Constitutional:  Negative for fever.  ?Eyes:  Positive for visual disturbance.  ?Respiratory:  Negative for shortness of breath.   ?Cardiovascular:  Negative for chest pain.  ?Musculoskeletal:  Positive for back pain (chronic, low back) and neck pain.  ?  Neurological:  Positive for weakness and headaches. Negative for dizziness and numbness.  ? ?Physical Exam ?Updated Vital Signs ?BP 133/77   Pulse 88   Temp 98 ?F (36.7 ?C) (Oral)   Resp 18   Ht 5\' 5"  (1.651 m)   Wt 62.6 kg   SpO2 99%   BMI 22.96 kg/m?  ?Physical Exam ?Vitals and nursing note reviewed.  ?Constitutional:   ?   General: She is not in acute distress. ?   Appearance: She is well-developed. She is not ill-appearing or diaphoretic.  ?HENT:  ?   Head: Normocephalic and atraumatic.  ?Cardiovascular:  ?   Rate and Rhythm: Normal rate and regular rhythm.  ?   Heart sounds: Normal heart sounds.  ?Pulmonary:  ?   Effort: Pulmonary effort is  normal.  ?   Breath sounds: Normal breath sounds.  ?Abdominal:  ?   Palpations: Abdomen is soft.  ?   Tenderness: There is no abdominal tenderness.  ?Musculoskeletal:  ?   Cervical back: No rigidity. Spinous process tenderness (~C6-7) and muscular tenderness present.  ?   Comments: No tenderness or swelling to bilateral arms  ?Skin: ?   General: Skin is warm and dry.  ?Neurological:  ?   Mental Status: She is alert.  ?   Comments: CN 3-12 grossly intact. 5/5 strength in all 4 extremities. Grossly normal sensation. Normal finger to nose. Normal gait without any assist devices  ? ? ?ED Results / Procedures / Treatments   ?Labs ?(all labs ordered are listed, but only abnormal results are displayed) ?Labs Reviewed  ?COMPREHENSIVE METABOLIC PANEL - Abnormal; Notable for the following components:  ?    Result Value  ? Glucose, Bld 152 (*)   ? Calcium 8.8 (*)   ? Total Protein 5.8 (*)   ? All other components within normal limits  ?CBC WITH DIFFERENTIAL/PLATELET  ?CK  ? ? ?EKG ?EKG Interpretation ? ?Date/Time:  Sunday February 20 2022 11:51:40 EST ?Ventricular Rate:  102 ?PR Interval:  128 ?QRS Duration: 76 ?QT Interval:  350 ?QTC Calculation: 456 ?R Axis:   28 ?Text Interpretation: Sinus tachycardia nonspecific T wave flattening ST/T changes improved when compared to May 2022 Confirmed by June 2022 [phone]) on 02/20/2022 12:32:47 PM ? ?Radiology ?No results found. ? ?Procedures ?Procedures  ? ? ?Medications Ordered in ED ?Medications  ?lactated ringers bolus 1,000 mL ( Intravenous Stopped 02/20/22 1423)  ?fentaNYL (SUBLIMAZE) injection 50 mcg (50 mcg Intravenous Given 02/20/22 1324)  ?iohexol (OMNIPAQUE) 350 MG/ML injection 100 mL (100 mLs Intravenous Contrast Given 02/20/22 1550)  ?acetaminophen (TYLENOL) tablet 1,000 mg (1,000 mg Oral Given 02/20/22 1535)  ? ? ?ED Course/ Medical Decision Making/ A&P ?  ?                        ?Medical Decision Making ?Amount and/or Complexity of Data Reviewed ?Labs: ordered. ?Radiology:  ordered. ? ?Risk ?OTC drugs. ?Prescription drug management. ? ? ?Patient seems to most likely have acute on chronic neck pain. She describes diffuse pain (burning) in her arms, which might be neuropathy. However, she has no focal neuro deficits. She also has intermittent symptoms such as trouble walking, transient blurry vision, and headache. While I think CVA is unlikely, we will image brain/c-spine and significant arteries with CTA. If this is negative, can likely have pain control and d/c home. CTA pending when care transferred to Dr. 04/22/22. Labs that were obtained have been reviewed and are unremarkable.  ECG shows no acute ischemia. No cardiac symptoms.  ? ? ? ? ? ? ? ?Final Clinical Impression(s) / ED Diagnoses ?Final diagnoses:  ?None  ? ? ?Rx / DC Orders ?ED Discharge Orders   ? ? None  ? ?  ? ? ?  ?Pricilla Loveless, MD ?02/20/22 1631 ? ?

## 2022-02-20 NOTE — ED Notes (Signed)
At bedside for assistance to bedside commode.  ?

## 2022-02-20 NOTE — Discharge Instructions (Signed)
If headaches persist MRI would be indicated.  Make an appointment follow-up with your primary care doctor.  Today's work-up to include CT angio head and neck without any acute findings.  Continue to take your pain medication as prescribed. ?

## 2022-04-21 IMAGING — DX DG CHEST 1V PORT
1 series · 1 of 1 positions shown · non-contrast
Comparison: June 10, 2018

CLINICAL DATA: Shortness of breath and chest pain

EXAM:
PORTABLE CHEST 1 VIEW

[chest ap]
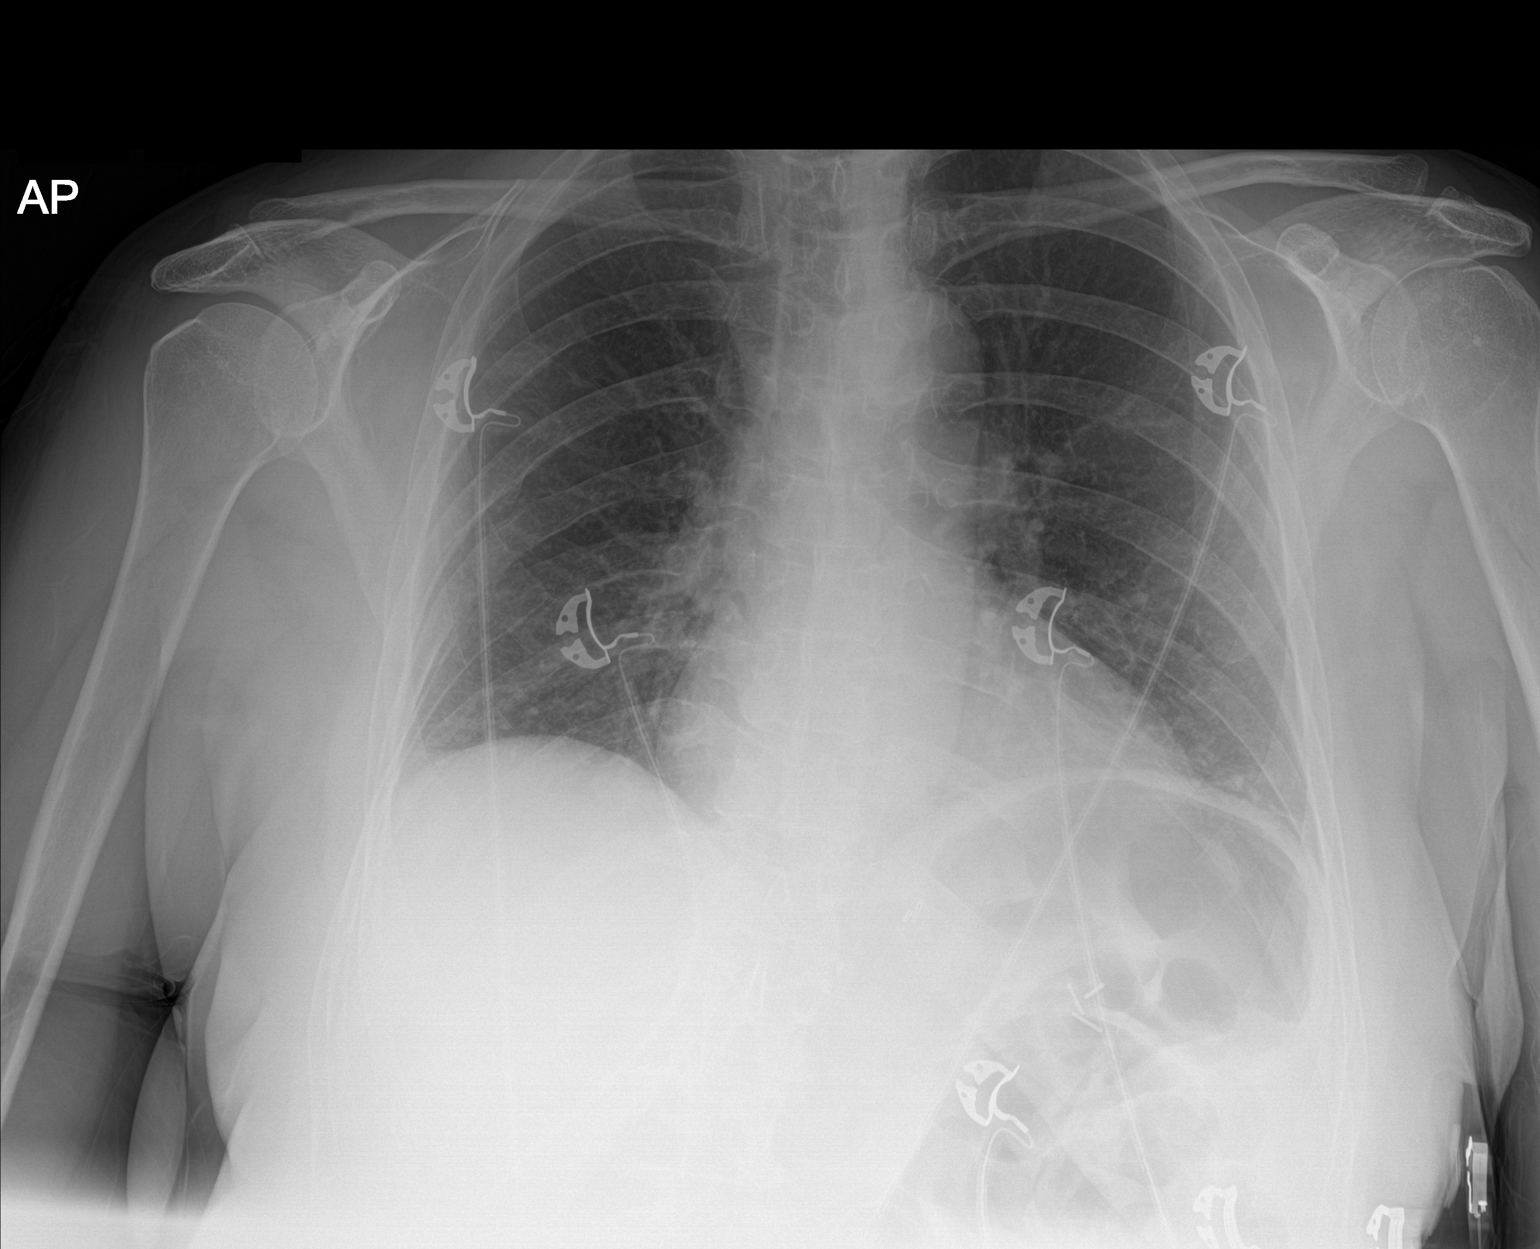

[1 of 1 positions shown; findings below may reference images not displayed]

FINDINGS: There is slight left base atelectasis. Lungs elsewhere are clear.
Heart size and pulmonary vascularity are normal. No adenopathy. No
pneumothorax. No bone lesions.
IMPRESSION: Slight left base atelectasis. Lungs elsewhere clear. Heart size
normal.

## 2022-08-30 ENCOUNTER — Encounter (HOSPITAL_COMMUNITY): Payer: Self-pay

## 2022-08-30 ENCOUNTER — Other Ambulatory Visit: Payer: Self-pay

## 2022-08-30 ENCOUNTER — Emergency Department (HOSPITAL_COMMUNITY)
Admission: EM | Admit: 2022-08-30 | Discharge: 2022-08-31 | Disposition: A | Discharge status: Home / Self Care | Payer: Medicare Other | Attending: Emergency Medicine | Admitting: Emergency Medicine

## 2022-08-30 ENCOUNTER — Emergency Department (HOSPITAL_COMMUNITY): Payer: Medicare Other

## 2022-08-30 DIAGNOSIS — R110 Nausea: Secondary | ICD-10-CM | POA: Insufficient documentation

## 2022-08-30 DIAGNOSIS — E860 Dehydration: Secondary | ICD-10-CM | POA: Diagnosis not present

## 2022-08-30 DIAGNOSIS — R197 Diarrhea, unspecified: Secondary | ICD-10-CM | POA: Diagnosis present

## 2022-08-30 DIAGNOSIS — R531 Weakness: Secondary | ICD-10-CM | POA: Insufficient documentation

## 2022-08-30 DIAGNOSIS — R0789 Other chest pain: Secondary | ICD-10-CM | POA: Diagnosis not present

## 2022-08-30 DIAGNOSIS — R109 Unspecified abdominal pain: Secondary | ICD-10-CM | POA: Insufficient documentation

## 2022-08-30 HISTORY — DX: Unspecified intestinal obstruction, unspecified as to partial versus complete obstruction: K56.609

## 2022-08-30 LAB — COMPREHENSIVE METABOLIC PANEL
ALT: 15 U/L (ref 0–44)
AST: 14 U/L — ABNORMAL LOW (ref 15–41)
Albumin: 3.1 g/dL — ABNORMAL LOW (ref 3.5–5.0)
Alkaline Phosphatase: 60 U/L (ref 38–126)
Anion gap: 7 (ref 5–15)
BUN: 11 mg/dL (ref 8–23)
CO2: 25 mmol/L (ref 22–32)
Calcium: 8.6 mg/dL — ABNORMAL LOW (ref 8.9–10.3)
Chloride: 106 mmol/L (ref 98–111)
Creatinine, Ser: 0.49 mg/dL (ref 0.44–1.00)
GFR, Estimated: >60 mL/min (ref >60)
Glucose, Bld: 110 mg/dL — ABNORMAL HIGH (ref 70–99)
Potassium: 3.5 mmol/L (ref 3.5–5.1)
Sodium: 138 mmol/L (ref 135–145)
Total Bilirubin: 0.5 mg/dL (ref 0.3–1.2)
Total Protein: 6.1 g/dL — ABNORMAL LOW (ref 6.5–8.1)

## 2022-08-30 LAB — CBC
HCT: 40.6 % (ref 36.0–46.0)
Hemoglobin: 13.4 g/dL (ref 12.0–15.0)
MCH: 29.8 pg (ref 26.0–34.0)
MCHC: 33 g/dL (ref 30.0–36.0)
MCV: 90.2 fL (ref 80.0–100.0)
Platelets: 706 10*3/uL — ABNORMAL HIGH (ref 150–400)
RBC: 4.5 MIL/uL (ref 3.87–5.11)
RDW: 13.4 % (ref 11.5–15.5)
WBC: 8.8 10*3/uL (ref 4.0–10.5)
nRBC: 0 % (ref 0.0–0.2)

## 2022-08-30 LAB — URINALYSIS, ROUTINE W REFLEX MICROSCOPIC
Bilirubin Urine: NEGATIVE
Glucose, UA: NEGATIVE mg/dL
Hgb urine dipstick: NEGATIVE
Ketones, ur: 5 mg/dL — AB
Leukocytes,Ua: NEGATIVE
Nitrite: NEGATIVE
Protein, ur: 30 mg/dL — AB
Specific Gravity, Urine: 1.02 (ref 1.005–1.030)
pH: 5 (ref 5.0–8.0)

## 2022-08-30 LAB — TROPONIN I (HIGH SENSITIVITY): Troponin I (High Sensitivity): 3 ng/L (ref <18)

## 2022-08-30 MED ORDER — LORAZEPAM 2 MG/ML IJ SOLN
1.0000 mg | Freq: Once | INTRAMUSCULAR | Status: AC
  Administered 2022-08-30: 1 mg via INTRAVENOUS
  Filled 2022-08-30: qty 1

## 2022-08-30 MED ORDER — OXYCODONE-ACETAMINOPHEN 5-325 MG PO TABS
1.0000 | ORAL_TABLET | ORAL | Status: DC | PRN
  Administered 2022-08-30: 1 via ORAL
  Filled 2022-08-30: qty 1

## 2022-08-30 MED ORDER — SODIUM CHLORIDE 0.9 % IV BOLUS
1000.0000 mL | Freq: Once | INTRAVENOUS | Status: AC
  Administered 2022-08-30: 1000 mL via INTRAVENOUS

## 2022-08-30 MED ORDER — IOHEXOL 300 MG/ML  SOLN
100.0000 mL | Freq: Once | INTRAMUSCULAR | Status: AC | PRN
  Administered 2022-08-30: 80 mL via INTRAVENOUS

## 2022-08-30 NOTE — ED Notes (Signed)
FLOAT NURSE 

## 2022-08-30 NOTE — ED Notes (Signed)
Patient's daughter called and stated that patient's issues started after taking antibiotics after her abdominal surgery.  Daughter states that she has observed her mother after taking the medication and she becomes "shaky and states she just doesn't feel right". She has loss of appetite, but it is starting to increase.  She is concerned that medication may be the cause of her issues and would like to speak with the physician after her work-up in the ED.

## 2022-08-30 NOTE — ED Triage Notes (Signed)
Pt presents to ED via [facility] for generalized weakness. Pt discharged from hospital yesterday [facility]) after having bowel resection. Pt states she started Cefdinir and Metronidazole and states she feels weak, unable to tolerate antibiotics, doesn't have enough energy to sit up.

## 2022-08-30 NOTE — ED Provider Notes (Signed)
[facility] EMERGENCY DEPARTMENT Provider Note   CSN: [id number] Arrival date & time: 08/30/22  1609     History  Chief Complaint  Patient presents with   Weakness    Heather [name] is a 61 y.o. female.  Patient with hx cecal volvulus, s/p right hemicolectomy ([facility]) ~ 2 weeks ago, states d/c yesterday on oral abx for abd incision seroma/wound infection, but that feels generally weak, nauseated, and is having diarrhea, ~ 8-10 stools per day in past couple days. No known bad food ingestion or ill contacts. No fever or chills, although temp in ED 99.6. no dysuria. No spreading redness or increased pain/swelling about incision. No vomiting. No increased abd distension. No back/flank pain. Felt mild discomfort in chest area earlier, at rest - no exertional cp or discomfort. No associated sob or diaphoresis. No pleuritic pain. No leg pain or swelling. No cough or uri symptoms.   The history is provided by the patient, medical records and the EMS personnel.  Weakness Associated symptoms: diarrhea and nausea   Associated symptoms: no abdominal pain, no chest pain, no cough, no dysuria, no fever, no headaches and no shortness of breath        Home Medications Prior to Admission medications   Medication Sig Start Date End Date Taking? Authorizing Provider  acetaminophen (TYLENOL) 325 MG tablet Take 650 mg by mouth every 4 (four) hours as needed for moderate pain or mild pain.   Yes [provider]  ALPRAZolam Prudy Feeler) 0.5 MG tablet Take 0.5 mg by mouth 2 (two) times daily. 12/24/19  Yes [provider]  cefdinir (OMNICEF) 300 MG capsule Take 300 mg by mouth 2 (two) times daily. 08/29/22  Yes [provider]  lansoprazole (PREVACID SOLUTAB) 15 MG disintegrating tablet Take 1 tablet by mouth daily.   Yes [provider]  lisinopril-hydrochlorothiazide (ZESTORETIC) 10-12.5 MG tablet Take 1 tablet by mouth daily.   Yes [provider]  metroNIDAZOLE  (FLAGYL) 500 MG tablet Take 500 mg by mouth 2 (two) times daily. 08/29/22  Yes [provider]  ondansetron (ZOFRAN ODT) 8 MG disintegrating tablet Take 1 tablet (8 mg total) by mouth every 8 (eight) hours as needed for nausea or vomiting. 06/18/21  Yes Cheron Schaumann K, PA-C  oxyCODONE (OXY IR/ROXICODONE) 5 MG immediate release tablet Take 5 mg by mouth 2 (two) times daily as needed. 08/29/22  Yes [provider]  ondansetron (ZOFRAN) 8 MG tablet Take by mouth. Patient not taking: Reported on 08/30/2022 08/30/22   [provider]  simethicone (MYLICON) 80 MG chewable tablet Chew by mouth. Patient not taking: Reported on 08/30/2022 08/29/22 09/08/22  [provider]  clonazePAM (KLONOPIN) 0.5 MG tablet Take 0.5 mg by mouth as needed.    02/25/12  [provider]      Allergies    Penicillins    Review of Systems   Review of Systems  Constitutional:  Negative for chills, diaphoresis and fever.  HENT:  Negative for sore throat.   Eyes:  Negative for redness.  Respiratory:  Negative for cough and shortness of breath.   Cardiovascular:  Negative for chest pain.  Gastrointestinal:  Positive for diarrhea and nausea. Negative for abdominal pain.  Genitourinary:  Negative for dysuria and flank pain.  Musculoskeletal:  Negative for back pain and neck pain.  Skin:  Negative for rash.  Neurological:  Positive for weakness. Negative for headaches.  Hematological:  Does not bruise/bleed easily.  Psychiatric/Behavioral:  Negative for confusion.  Physical Exam Updated Vital Signs BP 124/71   Pulse 100   Temp 98.6 F (37 C) (Oral)   Resp 18   Ht 1.651 m (5\' 5" )   Wt 53.5 kg   SpO2 99%   BMI 19.64 kg/m  Physical Exam Vitals and nursing note reviewed.  Constitutional:      Appearance: Normal appearance. She is well-developed.  HENT:     Head: Atraumatic.     Nose: Nose normal.     Mouth/Throat:     Mouth: Mucous membranes are moist.  Eyes:      General: No scleral icterus.    Conjunctiva/sclera: Conjunctivae normal.     Pupils: Pupils are equal, round, and reactive to light.  Neck:     Trachea: No tracheal deviation.  Cardiovascular:     Rate and Rhythm: Normal rate and regular rhythm.     Pulses: Normal pulses.     Heart sounds: Normal heart sounds. No murmur heard.    No friction rub. No gallop.  Pulmonary:     Effort: Pulmonary effort is normal. No respiratory distress.     Breath sounds: Normal breath sounds.  Abdominal:     General: Bowel sounds are normal. There is no distension.     Palpations: Abdomen is soft.     Tenderness: There is abdominal tenderness. There is no guarding.     Comments: Abd incision with area of prior cellulitis outlined - see photo - no erythema extending beyond prior markings. Small open area x 2, one that is packed. No fluctuance. No crepitus or severe tenderness.   Genitourinary:    Comments: No cva tenderness.  Musculoskeletal:        General: No swelling or tenderness.     Cervical back: Normal range of motion and neck supple. No rigidity. No muscular tenderness.     Right lower leg: No edema.     Left lower leg: No edema.  Skin:    General: Skin is warm and dry.     Findings: No rash.  Neurological:     Mental Status: She is alert.     Comments: Alert, speech normal.   Psychiatric:        Mood and Affect: Mood normal.     ED Results / Procedures / Treatments   Labs (all labs ordered are listed, but only abnormal results are displayed) Results for orders placed or performed during the hospital encounter of 08/30/22  CBC  Result Value Ref Range   WBC 8.8 4.0 - 10.5 K/uL   RBC 4.50 3.87 - 5.11 MIL/uL   Hemoglobin 13.4 12.0 - 15.0 g/dL   HCT 10/30/22 19.3 - 79.0 %   MCV 90.2 80.0 - 100.0 fL   MCH 29.8 26.0 - 34.0 pg   MCHC 33.0 30.0 - 36.0 g/dL   RDW 24.0 97.3 - 53.2 %   Platelets 706 (H) 150 - 400 K/uL   nRBC 0.0 0.0 - 0.2 %  Comprehensive metabolic panel  Result Value Ref  Range   Sodium 138 135 - 145 mmol/L   Potassium 3.5 3.5 - 5.1 mmol/L   Chloride 106 98 - 111 mmol/L   CO2 25 22 - 32 mmol/L   Glucose, Bld 110 (H) 70 - 99 mg/dL   BUN 11 8 - 23 mg/dL   Creatinine, Ser 99.2 0.44 - 1.00 mg/dL   Calcium 8.6 (L) 8.9 - 10.3 mg/dL   Total Protein 6.1 (L) 6.5 - 8.1 g/dL   Albumin  3.1 (L) 3.5 - 5.0 g/dL   AST 14 (L) 15 - 41 U/L   ALT 15 0 - 44 U/L   Alkaline Phosphatase 60 38 - 126 U/L   Total Bilirubin 0.5 0.3 - 1.2 mg/dL   GFR, Estimated >16 >10 mL/min   Anion gap 7 5 - 15  Urinalysis, Routine w reflex microscopic Urine, Clean Catch  Result Value Ref Range   Color, Urine YELLOW YELLOW   APPearance CLOUDY (A) CLEAR   Specific Gravity, Urine 1.020 1.005 - 1.030   pH 5.0 5.0 - 8.0   Glucose, UA NEGATIVE NEGATIVE mg/dL   Hgb urine dipstick NEGATIVE NEGATIVE   Bilirubin Urine NEGATIVE NEGATIVE   Ketones, ur 5 (A) NEGATIVE mg/dL   Protein, ur 30 (A) NEGATIVE mg/dL   Nitrite NEGATIVE NEGATIVE   Leukocytes,Ua NEGATIVE NEGATIVE   RBC / HPF 6-10 0 - 5 RBC/hpf   WBC, UA 0-5 0 - 5 WBC/hpf   Bacteria, UA RARE (A) NONE SEEN   Squamous Epithelial / LPF 0-5 0 - 5   Mucus PRESENT    Ca Oxalate Crys, UA PRESENT   Troponin I (High Sensitivity)  Result Value Ref Range   Troponin I (High Sensitivity) 3 <18 ng/L     EKG EKG Interpretation  Date/Time:  Tuesday August 30 2022 19:11:46 EDT Ventricular Rate:  99 PR Interval:  134 QRS Duration: 85 QT Interval:  344 QTC Calculation: 442 R Axis:   69 Text Interpretation: Sinus rhythm Nonspecific ST abnormality Confirmed by Cathren Laine ([id number]) on 08/30/2022 9:21:11 PM  Radiology CT Abdomen Pelvis W Contrast  Result Date: 08/30/2022 CLINICAL DATA:  Abdominal pain x1 week EXAM: CT ABDOMEN AND PELVIS WITH CONTRAST TECHNIQUE: Multidetector CT imaging of the abdomen and pelvis was performed using the standard protocol following bolus administration of intravenous contrast. RADIATION DOSE REDUCTION: This exam was  performed according to the departmental dose-optimization program which includes automated exposure control, adjustment of the mA and/or kV according to patient size and/or use of iterative reconstruction technique. CONTRAST:  4mL OMNIPAQUE IOHEXOL 300 MG/ML  SOLN COMPARISON:  06/18/2021 FINDINGS: Lower chest: Mild left basilar opacities, likely atelectasis. Hepatobiliary: Liver is within normal limits. Gallbladder is unremarkable. No intrahepatic or extrahepatic ductal dilatation. Pancreas: Stable prominence of the main pancreatic duct, measuring 8 mm (series 2/image 33), extending to the ampulla. No obstructing pancreatic mass. Spleen: Within normal limits. Adrenals/Urinary Tract: Adrenal glands are within normal limits. Right kidney is within normal limits. 18 mm simple left lower pole renal cyst (series 2/image 31), benign (Bosniak I). No hydronephrosis. Bladder is underdistended but unremarkable. Stomach/Bowel: Stomach is within normal limits. No evidence of bowel obstruction. Appendix is not discretely visualized. Small bowel suture line in the left mid abdomen (series 2/image 35). Thick-walled loops of small bowel in the pelvis (series 2/image 66), with mild mesenteric stranding, suggesting infectious inflammatory enteritis. No pneumatosis or free air. Mild left colonic diverticulosis, without evidence of diverticulitis. Vascular/Lymphatic: No evidence of abdominal aortic aneurysm. Atherosclerotic calcifications of the abdominal aorta and branch vessels. No suspicious abdominopelvic lymphadenopathy. Reproductive: Status post hysterectomy. No adnexal masses. Other: No abdominopelvic ascites. Postsurgical changes along the midline anterior abdominal wall. Ventral hernia mesh repair. Musculoskeletal: Mild degenerative changes of the lumbar spine with distal scoliosis. Status post PLIF at L5-S1, without evidence complication. IMPRESSION: Thick-walled loops of distal small bowel, suggesting infectious  inflammatory enteritis. No pneumatosis or free air. Electronically Signed   By: Charline Bills M.D.   On: 08/30/2022 20:50  Procedures Procedures    Medications Ordered in ED Medications  oxyCODONE-acetaminophen (PERCOCET/ROXICET) 5-325 MG per tablet 1 tablet (1 tablet Oral Given 08/30/22 1829)  sodium chloride 0.9 % bolus 1,000 mL (0 mLs Intravenous Stopped 08/30/22 1956)  LORazepam (ATIVAN) injection 1 mg (1 mg Intravenous Given 08/30/22 2011)  iohexol (OMNIPAQUE) 300 MG/ML solution 100 mL (80 mLs Intravenous Contrast Given 08/30/22 2043)  sodium chloride 0.9 % bolus 1,000 mL (0 mLs Intravenous Stopped 08/30/22 2332)    ED Course/ Medical Decision Making/ A&P                           Medical Decision Making Problems Addressed: Acute diarrhea: acute illness or injury with systemic symptoms that poses a threat to life or bodily functions Dehydration: acute illness or injury with systemic symptoms that poses a threat to life or bodily functions Nausea in adult: acute illness or injury  Amount and/or Complexity of Data Reviewed Independent Historian: EMS    Details: hx External Data Reviewed: notes. Labs: ordered. Decision-making details documented in ED Course. Radiology: ordered and independent interpretation performed. Decision-making details documented in ED Course. ECG/medicine tests: ordered and independent interpretation performed. Decision-making details documented in ED Course.  Risk Prescription drug management. Decision regarding hospitalization.   Iv ns. Continuous pulse ox and cardiac monitoring. Labs ordered/sent. Imaging ordered.    Diff dx includes abd abscess, dehydration, aki, uti etc - dispo decision including potential need for admission if bowel obstruction, abscess, significant aki etc considered - will get labs and imaging and reassess.   Reviewed nursing notes and prior charts for additional history. External reports reviewed. Additional history  from:  Cardiac monitor: sinus rhythm, rate 115.  Labs reviewed/interpreted by me - wbc and hgb normal.   Ns bolus. Zofran iv.   CT reviewed/interpreted by me - ?enteritis.   Recheck abd soft non tender. No episodes of nausea, vomiting or diarrhea during observation period in ED.    Abd soft nt. Vitals normal. Po fluids. Pt tolerating po. Area of prior cellulitis on abd exam/outline appears improved from prior.   Rec rehydration, complete course abx and close pcp/surg follow up .  Return precautions provided.           Final Clinical Impression(s) / ED Diagnoses Final diagnoses:  None    Rx / DC Orders ED Discharge Orders     None         Cathren Laine, MD 08/30/22 2340

## 2022-08-30 NOTE — Discharge Instructions (Signed)
It was our pleasure to provide your ER care today - we hope that you feel better.  Drink plenty of fluids/stay well hydrated.   Complete the course of your antibiotic.  Follow up closely with primary care doctor/surgeon in the next 2-3 days.   Return to ER right away if worse, new symptoms, fevers, new, worsening or severe abdominal pain, persistent vomiting, weak/fainting, spreading redness or increased swelling about incision, or other concern.   You were given medication in the ER - no driving for the next 6 hours.

## 2023-07-28 IMAGING — CT CT ANGIO HEAD-NECK (W OR W/O PERF)
1 of 11 series · 5 of 33 positions shown · IV contrast (Omnipaque)
Comparison: Brain MRI 07/23/2020. Head CT 07/23/2020. CT angiogram
head/neck 07/23/2020.

CLINICAL DATA: Vertebral artery dissection suspected. Additional
history provided: Worsening pain and weakness in left hand,
worsening back pain. Headache.

EXAM:
CT ANGIOGRAPHY HEAD AND NECK
TECHNIQUE: Multidetector CT imaging of the head and neck was performed using
the standard protocol during bolus administration of intravenous
contrast. Multiplanar CT image reconstructions and MIPs were
obtained to evaluate the vascular anatomy. Carotid stenosis
measurements (when applicable) are obtained utilizing NASCET
criteria, using the distal internal carotid diameter as the
denominator.

[Series 11: axial thin · axial · 0.49mm/px · z∈[-409,-193]mm · 5 of 324 slices shown]
[im 54/324  soft-tissue]
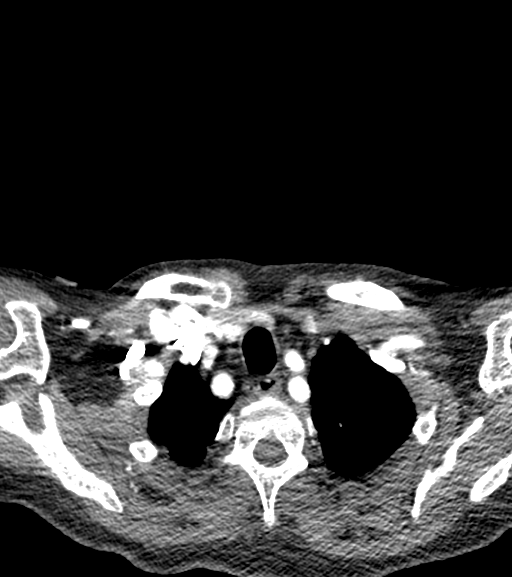
[im 108/324  bone]
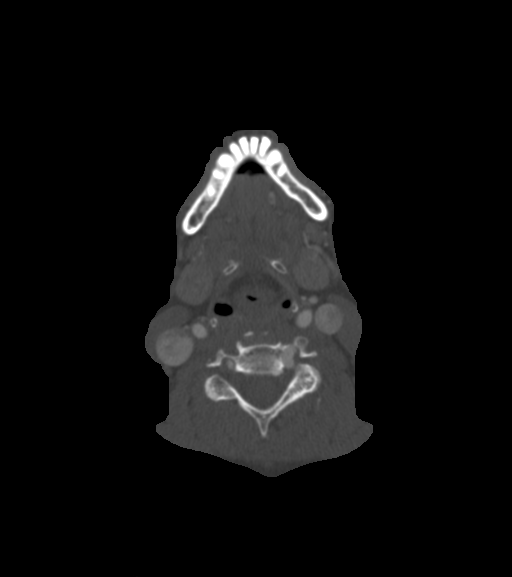
[im 162/324  soft-tissue]
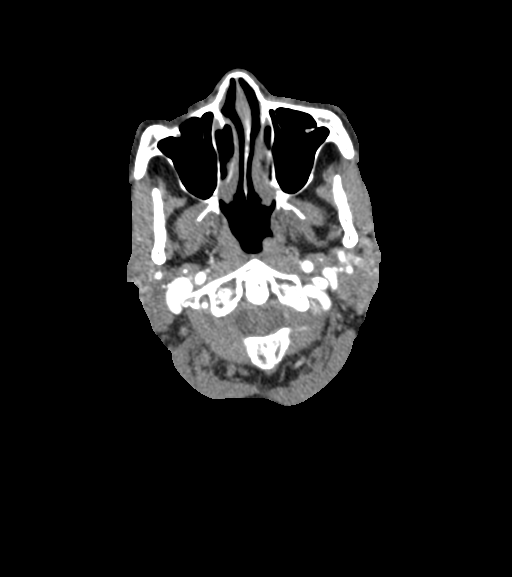
[im 216/324  bone]
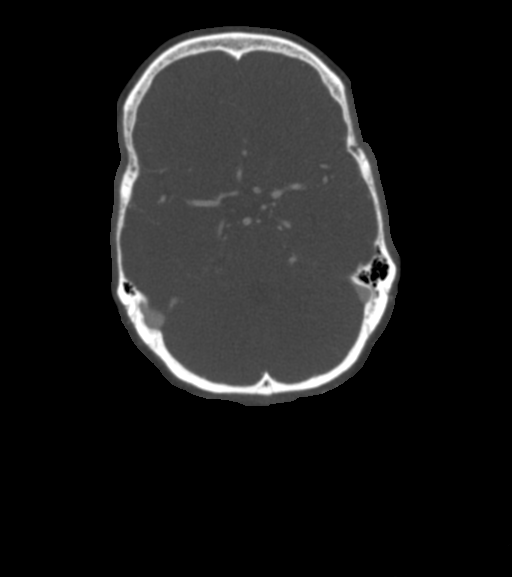
[im 270/324  soft-tissue]
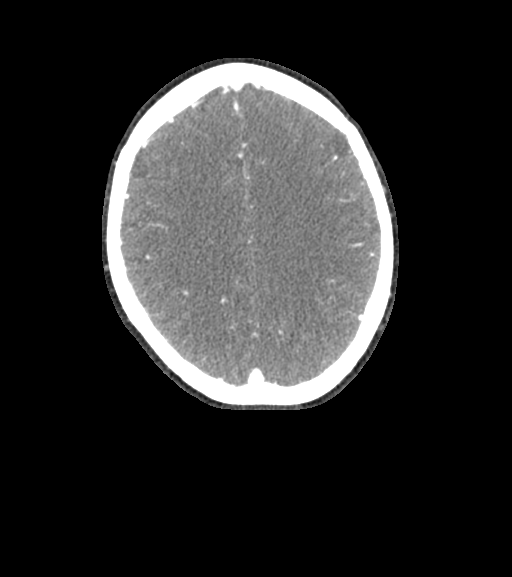

[5 of 33 positions shown; findings below may reference images not displayed]

RADIATION DOSE REDUCTION: This exam was performed according to the
departmental dose-optimization program which includes automated
exposure control, adjustment of the mA and/or kV according to
patient size and/or use of iterative reconstruction technique.

CONTRAST:  100mL OMNIPAQUE IOHEXOL 350 MG/ML SOLN
FINDINGS: CT HEAD FINDINGS

Brain:

Cerebral volume is normal.

There is no acute intracranial hemorrhage.

No demarcated cortical infarct.

No extra-axial fluid collection.

No evidence of an intracranial mass.

No midline shift.

Vascular: No hyperdense vessel.  Atherosclerotic calcifications.

Skull: Normal. Negative for fracture or focal lesion.

Sinuses: Trace mucosal thickening within the left ethmoid air cells.

Orbits: No orbital mass or acute orbital finding.

Review of the MIP images confirms the above findings

CTA NECK FINDINGS

Aortic arch: Standard aortic branching. Atherosclerotic plaque
within the visualized aortic arch and proximal major branch vessels
of the neck. Streak and beam hardening artifact from a dense
right-sided contrast bolus partially obscures the innominate,
proximal right subclavian and proximal right common carotid
arteries. Within this limitation, there is no appreciable
hemodynamically significant stenosis within the innominate or
proximal subclavian arteries.

Right carotid system: CCA and ICA patent within the neck without
stenosis or appreciable significant atherosclerotic disease. No
evidence of dissection. Tortuosity of the cervical ICA.

Left carotid system: CCA and ICA patent within the neck without
stenosis. Minimal atherosclerotic plaque about the carotid
bifurcation. Tortuosity of the cervical ICA. No evidence of
dissection. Possible carotid web within the posterior 0 inferior
left carotid bulb (for instance as seen on series 15, image 143).

Vertebral arteries: Vertebral arteries patent within the neck. The
left vertebral artery is dominant. Right-sided venous reflux
partially obscures the V1 right vertebral artery. No appreciable
hemodynamically significant stenosis or dissection within the
cervical vertebral arteries.

Skeleton: Cervical spondylosis. No acute bony abnormality or
aggressive osseous lesion.

Other neck: No neck mass or cervical lymphadenopathy.

Upper chest: No consolidation within the imaged lung apices.

Review of the MIP images confirms the above findings

CTA HEAD FINDINGS

Anterior circulation:

The intracranial internal carotid arteries are patent.
Atherosclerotic plaque within both vessels. No significant stenosis
of the intracranial right ICA. Mild-to-moderate stenosis of the
paraclinoid left ICA. The M1 middle cerebral arteries are patent. No
M2 proximal branch occlusion or high-grade proximal stenosis is
identified. The anterior cerebral arteries are patent. No
intracranial aneurysm is identified.

Posterior circulation:

The intracranial vertebral arteries are patent. The non dominant
right vertebral artery is developmentally diminutive beyond the
right PICA origin with superimposed atherosclerotic irregularity the
dominant intracranial left vertebral artery is patent without
stenosis. The basilar artery is patent. The posterior cerebral
arteries are patent. Small left posterior communicating artery. The
right posterior communicating artery is diminutive or absent.

Venous sinuses: Within the limitations of contrast timing, no
convincing thrombus.

Anatomic variants: As described.

Review of the MIP images confirms the above findings
IMPRESSION: CT head:

No evidence of acute intracranial abnormality.

CTA neck:

The common carotid, internal carotid and vertebral arteries are
patent within the neck without appreciable stenosis or evidence of
dissection. Minimal atherosclerotic plaque about the left carotid
bifurcation.

CTA head:

1. No intracranial large vessel occlusion.
2. The non dominant right vertebral artery is developmentally
diminutive beyond the right PICA origin, with superimposed
atherosclerotic irregularity.

## 2024-02-02 ENCOUNTER — Encounter: Payer: Self-pay | Admitting: Physician Assistant

## 2024-04-29 DIAGNOSIS — M5116 Intervertebral disc disorders with radiculopathy, lumbar region: Secondary | ICD-10-CM | POA: Diagnosis not present

## 2024-04-29 DIAGNOSIS — M4185 Other forms of scoliosis, thoracolumbar region: Secondary | ICD-10-CM | POA: Diagnosis not present

## 2024-04-29 DIAGNOSIS — M4327 Fusion of spine, lumbosacral region: Secondary | ICD-10-CM | POA: Diagnosis not present

## 2024-04-29 DIAGNOSIS — M5441 Lumbago with sciatica, right side: Secondary | ICD-10-CM | POA: Diagnosis not present

## 2024-04-29 DIAGNOSIS — M542 Cervicalgia: Secondary | ICD-10-CM | POA: Diagnosis not present

## 2024-04-29 DIAGNOSIS — M5417 Radiculopathy, lumbosacral region: Secondary | ICD-10-CM | POA: Diagnosis not present

## 2024-04-29 DIAGNOSIS — G8929 Other chronic pain: Secondary | ICD-10-CM | POA: Diagnosis not present

## 2024-04-29 DIAGNOSIS — M4726 Other spondylosis with radiculopathy, lumbar region: Secondary | ICD-10-CM | POA: Diagnosis not present

## 2024-05-01 DIAGNOSIS — G894 Chronic pain syndrome: Secondary | ICD-10-CM | POA: Diagnosis not present

## 2024-05-01 DIAGNOSIS — M47812 Spondylosis without myelopathy or radiculopathy, cervical region: Secondary | ICD-10-CM | POA: Diagnosis not present

## 2024-05-01 DIAGNOSIS — M5416 Radiculopathy, lumbar region: Secondary | ICD-10-CM | POA: Diagnosis not present

## 2024-05-03 DIAGNOSIS — M5416 Radiculopathy, lumbar region: Secondary | ICD-10-CM | POA: Diagnosis not present

## 2024-05-13 DIAGNOSIS — X501XXA Overexertion from prolonged static or awkward postures, initial encounter: Secondary | ICD-10-CM | POA: Diagnosis not present

## 2024-05-13 DIAGNOSIS — I10 Essential (primary) hypertension: Secondary | ICD-10-CM | POA: Diagnosis not present

## 2024-05-13 DIAGNOSIS — M25561 Pain in right knee: Secondary | ICD-10-CM | POA: Diagnosis not present

## 2024-05-13 DIAGNOSIS — S8391XA Sprain of unspecified site of right knee, initial encounter: Secondary | ICD-10-CM | POA: Diagnosis not present

## 2024-05-13 DIAGNOSIS — Y92009 Unspecified place in unspecified non-institutional (private) residence as the place of occurrence of the external cause: Secondary | ICD-10-CM | POA: Diagnosis not present

## 2024-05-13 DIAGNOSIS — F17290 Nicotine dependence, other tobacco product, uncomplicated: Secondary | ICD-10-CM | POA: Diagnosis not present

## 2024-05-13 DIAGNOSIS — Y9389 Activity, other specified: Secondary | ICD-10-CM | POA: Diagnosis not present

## 2024-05-13 DIAGNOSIS — M7989 Other specified soft tissue disorders: Secondary | ICD-10-CM | POA: Diagnosis not present

## 2024-05-17 DIAGNOSIS — K047 Periapical abscess without sinus: Secondary | ICD-10-CM | POA: Diagnosis not present

## 2024-05-17 DIAGNOSIS — R350 Frequency of micturition: Secondary | ICD-10-CM | POA: Diagnosis not present

## 2024-05-17 DIAGNOSIS — R7309 Other abnormal glucose: Secondary | ICD-10-CM | POA: Diagnosis not present

## 2024-05-21 DIAGNOSIS — M85851 Other specified disorders of bone density and structure, right thigh: Secondary | ICD-10-CM | POA: Diagnosis not present

## 2024-05-21 DIAGNOSIS — Z780 Asymptomatic menopausal state: Secondary | ICD-10-CM | POA: Diagnosis not present

## 2024-05-21 DIAGNOSIS — M4327 Fusion of spine, lumbosacral region: Secondary | ICD-10-CM | POA: Diagnosis not present

## 2024-05-21 DIAGNOSIS — M5441 Lumbago with sciatica, right side: Secondary | ICD-10-CM | POA: Diagnosis not present

## 2024-05-21 DIAGNOSIS — G8929 Other chronic pain: Secondary | ICD-10-CM | POA: Diagnosis not present

## 2024-05-21 DIAGNOSIS — M542 Cervicalgia: Secondary | ICD-10-CM | POA: Diagnosis not present

## 2024-05-21 DIAGNOSIS — M5417 Radiculopathy, lumbosacral region: Secondary | ICD-10-CM | POA: Diagnosis not present

## 2024-05-22 DIAGNOSIS — M47812 Spondylosis without myelopathy or radiculopathy, cervical region: Secondary | ICD-10-CM | POA: Diagnosis not present

## 2024-05-22 DIAGNOSIS — M5412 Radiculopathy, cervical region: Secondary | ICD-10-CM | POA: Diagnosis not present

## 2024-05-22 DIAGNOSIS — M5416 Radiculopathy, lumbar region: Secondary | ICD-10-CM | POA: Diagnosis not present

## 2024-05-22 DIAGNOSIS — G894 Chronic pain syndrome: Secondary | ICD-10-CM | POA: Diagnosis not present

## 2024-05-27 DIAGNOSIS — M5416 Radiculopathy, lumbar region: Secondary | ICD-10-CM | POA: Diagnosis not present

## 2024-05-27 DIAGNOSIS — M48061 Spinal stenosis, lumbar region without neurogenic claudication: Secondary | ICD-10-CM | POA: Diagnosis not present

## 2024-05-27 DIAGNOSIS — M4125 Other idiopathic scoliosis, thoracolumbar region: Secondary | ICD-10-CM | POA: Diagnosis not present

## 2024-06-25 DIAGNOSIS — G894 Chronic pain syndrome: Secondary | ICD-10-CM | POA: Diagnosis not present

## 2024-06-25 DIAGNOSIS — M47812 Spondylosis without myelopathy or radiculopathy, cervical region: Secondary | ICD-10-CM | POA: Diagnosis not present

## 2024-06-25 DIAGNOSIS — M5412 Radiculopathy, cervical region: Secondary | ICD-10-CM | POA: Diagnosis not present

## 2024-06-25 DIAGNOSIS — M5416 Radiculopathy, lumbar region: Secondary | ICD-10-CM | POA: Diagnosis not present

## 2024-07-05 ENCOUNTER — Encounter: Payer: Self-pay | Admitting: Advanced Practice Midwife

## 2024-07-25 DIAGNOSIS — K089 Disorder of teeth and supporting structures, unspecified: Secondary | ICD-10-CM | POA: Diagnosis not present

## 2024-07-25 DIAGNOSIS — Z8719 Personal history of other diseases of the digestive system: Secondary | ICD-10-CM | POA: Diagnosis not present

## 2024-07-25 DIAGNOSIS — F32A Depression, unspecified: Secondary | ICD-10-CM | POA: Diagnosis not present

## 2024-07-25 DIAGNOSIS — Z981 Arthrodesis status: Secondary | ICD-10-CM | POA: Diagnosis not present

## 2024-07-25 DIAGNOSIS — F17290 Nicotine dependence, other tobacco product, uncomplicated: Secondary | ICD-10-CM | POA: Diagnosis not present

## 2024-07-25 DIAGNOSIS — M810 Age-related osteoporosis without current pathological fracture: Secondary | ICD-10-CM | POA: Diagnosis not present

## 2024-07-26 DIAGNOSIS — F17290 Nicotine dependence, other tobacco product, uncomplicated: Secondary | ICD-10-CM | POA: Diagnosis not present

## 2024-07-26 DIAGNOSIS — R059 Cough, unspecified: Secondary | ICD-10-CM | POA: Diagnosis not present

## 2024-07-26 DIAGNOSIS — I10 Essential (primary) hypertension: Secondary | ICD-10-CM | POA: Diagnosis not present

## 2024-07-26 DIAGNOSIS — R112 Nausea with vomiting, unspecified: Secondary | ICD-10-CM | POA: Diagnosis not present

## 2024-07-26 DIAGNOSIS — M549 Dorsalgia, unspecified: Secondary | ICD-10-CM | POA: Diagnosis not present

## 2024-07-26 DIAGNOSIS — R197 Diarrhea, unspecified: Secondary | ICD-10-CM | POA: Diagnosis not present

## 2024-07-26 DIAGNOSIS — M810 Age-related osteoporosis without current pathological fracture: Secondary | ICD-10-CM | POA: Diagnosis not present

## 2024-07-26 DIAGNOSIS — R1031 Right lower quadrant pain: Secondary | ICD-10-CM | POA: Diagnosis not present

## 2024-07-26 DIAGNOSIS — R0981 Nasal congestion: Secondary | ICD-10-CM | POA: Diagnosis not present

## 2024-07-29 DIAGNOSIS — M47812 Spondylosis without myelopathy or radiculopathy, cervical region: Secondary | ICD-10-CM | POA: Diagnosis not present

## 2024-07-29 DIAGNOSIS — M5416 Radiculopathy, lumbar region: Secondary | ICD-10-CM | POA: Diagnosis not present

## 2024-07-29 DIAGNOSIS — G894 Chronic pain syndrome: Secondary | ICD-10-CM | POA: Diagnosis not present

## 2024-07-29 DIAGNOSIS — Z79899 Other long term (current) drug therapy: Secondary | ICD-10-CM | POA: Diagnosis not present

## 2024-07-29 DIAGNOSIS — Z5181 Encounter for therapeutic drug level monitoring: Secondary | ICD-10-CM | POA: Diagnosis not present

## 2024-07-31 DIAGNOSIS — M48061 Spinal stenosis, lumbar region without neurogenic claudication: Secondary | ICD-10-CM | POA: Diagnosis not present

## 2024-07-31 DIAGNOSIS — M51369 Other intervertebral disc degeneration, lumbar region without mention of lumbar back pain or lower extremity pain: Secondary | ICD-10-CM | POA: Diagnosis not present

## 2024-07-31 DIAGNOSIS — Z981 Arthrodesis status: Secondary | ICD-10-CM | POA: Diagnosis not present

## 2024-07-31 DIAGNOSIS — F17290 Nicotine dependence, other tobacco product, uncomplicated: Secondary | ICD-10-CM | POA: Diagnosis not present

## 2024-07-31 DIAGNOSIS — M5416 Radiculopathy, lumbar region: Secondary | ICD-10-CM | POA: Diagnosis not present

## 2024-07-31 DIAGNOSIS — Z8719 Personal history of other diseases of the digestive system: Secondary | ICD-10-CM | POA: Diagnosis not present

## 2024-07-31 DIAGNOSIS — F32A Depression, unspecified: Secondary | ICD-10-CM | POA: Diagnosis not present

## 2024-07-31 DIAGNOSIS — M810 Age-related osteoporosis without current pathological fracture: Secondary | ICD-10-CM | POA: Diagnosis not present

## 2024-08-13 DIAGNOSIS — M545 Low back pain, unspecified: Secondary | ICD-10-CM | POA: Diagnosis not present

## 2024-08-13 DIAGNOSIS — S0990XA Unspecified injury of head, initial encounter: Secondary | ICD-10-CM | POA: Diagnosis not present

## 2024-08-13 DIAGNOSIS — Y9389 Activity, other specified: Secondary | ICD-10-CM | POA: Diagnosis not present

## 2024-08-13 DIAGNOSIS — M25552 Pain in left hip: Secondary | ICD-10-CM | POA: Diagnosis not present

## 2024-08-13 DIAGNOSIS — Y929 Unspecified place or not applicable: Secondary | ICD-10-CM | POA: Diagnosis not present

## 2024-08-13 DIAGNOSIS — Y999 Unspecified external cause status: Secondary | ICD-10-CM | POA: Diagnosis not present

## 2024-08-13 DIAGNOSIS — R519 Headache, unspecified: Secondary | ICD-10-CM | POA: Diagnosis not present

## 2024-08-13 DIAGNOSIS — S134XXA Sprain of ligaments of cervical spine, initial encounter: Secondary | ICD-10-CM | POA: Diagnosis not present

## 2024-08-13 DIAGNOSIS — M542 Cervicalgia: Secondary | ICD-10-CM | POA: Diagnosis not present

## 2024-08-26 DIAGNOSIS — M47812 Spondylosis without myelopathy or radiculopathy, cervical region: Secondary | ICD-10-CM | POA: Diagnosis not present

## 2024-08-26 DIAGNOSIS — G894 Chronic pain syndrome: Secondary | ICD-10-CM | POA: Diagnosis not present

## 2024-09-10 DIAGNOSIS — Z01818 Encounter for other preprocedural examination: Secondary | ICD-10-CM | POA: Diagnosis not present

## 2024-09-10 DIAGNOSIS — M5416 Radiculopathy, lumbar region: Secondary | ICD-10-CM | POA: Diagnosis not present

## 2024-10-29 DIAGNOSIS — M47812 Spondylosis without myelopathy or radiculopathy, cervical region: Secondary | ICD-10-CM | POA: Diagnosis not present

## 2024-10-29 DIAGNOSIS — M5416 Radiculopathy, lumbar region: Secondary | ICD-10-CM | POA: Diagnosis not present

## 2024-10-29 DIAGNOSIS — G894 Chronic pain syndrome: Secondary | ICD-10-CM | POA: Diagnosis not present

## 2024-11-01 DIAGNOSIS — M4327 Fusion of spine, lumbosacral region: Secondary | ICD-10-CM | POA: Diagnosis not present

## 2024-11-01 DIAGNOSIS — Z981 Arthrodesis status: Secondary | ICD-10-CM | POA: Diagnosis not present

## 2024-11-01 DIAGNOSIS — M51369 Other intervertebral disc degeneration, lumbar region without mention of lumbar back pain or lower extremity pain: Secondary | ICD-10-CM | POA: Diagnosis not present

## 2024-11-01 DIAGNOSIS — M5416 Radiculopathy, lumbar region: Secondary | ICD-10-CM | POA: Diagnosis not present

## 2024-11-01 DIAGNOSIS — Z01810 Encounter for preprocedural cardiovascular examination: Secondary | ICD-10-CM | POA: Diagnosis not present

## 2024-11-01 DIAGNOSIS — Z01818 Encounter for other preprocedural examination: Secondary | ICD-10-CM | POA: Diagnosis not present

## 2024-11-01 DIAGNOSIS — M4125 Other idiopathic scoliosis, thoracolumbar region: Secondary | ICD-10-CM | POA: Diagnosis not present

## 2024-11-01 DIAGNOSIS — M48061 Spinal stenosis, lumbar region without neurogenic claudication: Secondary | ICD-10-CM | POA: Diagnosis not present

## 2024-11-01 DIAGNOSIS — M8580 Other specified disorders of bone density and structure, unspecified site: Secondary | ICD-10-CM | POA: Diagnosis not present

## 2024-11-07 DIAGNOSIS — F17210 Nicotine dependence, cigarettes, uncomplicated: Secondary | ICD-10-CM | POA: Diagnosis not present

## 2024-11-07 DIAGNOSIS — Z9049 Acquired absence of other specified parts of digestive tract: Secondary | ICD-10-CM | POA: Diagnosis not present

## 2024-11-07 DIAGNOSIS — I10 Essential (primary) hypertension: Secondary | ICD-10-CM | POA: Diagnosis not present

## 2024-11-07 DIAGNOSIS — M5416 Radiculopathy, lumbar region: Secondary | ICD-10-CM | POA: Diagnosis not present

## 2024-11-07 DIAGNOSIS — M810 Age-related osteoporosis without current pathological fracture: Secondary | ICD-10-CM | POA: Diagnosis not present

## 2024-11-07 DIAGNOSIS — M5116 Intervertebral disc disorders with radiculopathy, lumbar region: Secondary | ICD-10-CM | POA: Diagnosis not present

## 2024-11-07 DIAGNOSIS — M545 Low back pain, unspecified: Secondary | ICD-10-CM | POA: Diagnosis not present

## 2024-11-07 DIAGNOSIS — M4125 Other idiopathic scoliosis, thoracolumbar region: Secondary | ICD-10-CM | POA: Diagnosis not present

## 2024-11-07 DIAGNOSIS — J439 Emphysema, unspecified: Secondary | ICD-10-CM | POA: Diagnosis not present

## 2024-11-07 DIAGNOSIS — M48061 Spinal stenosis, lumbar region without neurogenic claudication: Secondary | ICD-10-CM | POA: Diagnosis not present

## 2024-11-25 DIAGNOSIS — M5416 Radiculopathy, lumbar region: Secondary | ICD-10-CM | POA: Diagnosis not present
